# Patient Record
Sex: Female | Born: 1945
Health system: Southern US, Community
[De-identification: ages and names within clinical notes are randomized; demographics above are authoritative.]

## PROBLEM LIST (undated history)

## (undated) DIAGNOSIS — F419 Anxiety disorder, unspecified: Secondary | ICD-10-CM

## (undated) DIAGNOSIS — M1990 Unspecified osteoarthritis, unspecified site: Secondary | ICD-10-CM

## (undated) DIAGNOSIS — E7800 Pure hypercholesterolemia, unspecified: Secondary | ICD-10-CM

## (undated) DIAGNOSIS — L659 Nonscarring hair loss, unspecified: Secondary | ICD-10-CM

## (undated) DIAGNOSIS — E785 Hyperlipidemia, unspecified: Secondary | ICD-10-CM

## (undated) DIAGNOSIS — E079 Disorder of thyroid, unspecified: Secondary | ICD-10-CM

## (undated) DIAGNOSIS — K649 Unspecified hemorrhoids: Secondary | ICD-10-CM

## (undated) DIAGNOSIS — A6920 Lyme disease, unspecified: Secondary | ICD-10-CM

## (undated) DIAGNOSIS — M810 Age-related osteoporosis without current pathological fracture: Secondary | ICD-10-CM

## (undated) DIAGNOSIS — K589 Irritable bowel syndrome without diarrhea: Secondary | ICD-10-CM

## (undated) DIAGNOSIS — E039 Hypothyroidism, unspecified: Secondary | ICD-10-CM

## (undated) HISTORY — DX: Anxiety disorder, unspecified: F41.9

## (undated) HISTORY — DX: Nonscarring hair loss, unspecified: L65.9

## (undated) HISTORY — DX: Age-related osteoporosis without current pathological fracture: M81.0

## (undated) HISTORY — DX: Pure hypercholesterolemia, unspecified: E78.00

## (undated) HISTORY — PX: SHOULDER SURGERY: SHX246

## (undated) HISTORY — DX: Unspecified hemorrhoids: K64.9

## (undated) HISTORY — DX: Irritable bowel syndrome, unspecified: K58.9

## (undated) HISTORY — PX: TUBAL LIGATION: SHX77

## (undated) HISTORY — DX: Hyperlipidemia, unspecified: E78.5

## (undated) HISTORY — DX: Lyme disease, unspecified: A69.20

---

## 1998-03-19 ENCOUNTER — Other Ambulatory Visit: Admission: RE | Admit: 1998-03-19 | Discharge: 1998-03-19 | Payer: Self-pay | Admitting: Obstetrics and Gynecology

## 1999-03-10 ENCOUNTER — Other Ambulatory Visit: Admission: RE | Admit: 1999-03-10 | Discharge: 1999-03-10 | Payer: Self-pay | Admitting: Obstetrics and Gynecology

## 2000-04-21 ENCOUNTER — Other Ambulatory Visit: Admission: RE | Admit: 2000-04-21 | Discharge: 2000-04-21 | Payer: Self-pay | Admitting: Obstetrics and Gynecology

## 2000-06-08 ENCOUNTER — Other Ambulatory Visit: Admission: RE | Admit: 2000-06-08 | Discharge: 2000-06-08 | Payer: Self-pay | Admitting: Internal Medicine

## 2000-06-08 ENCOUNTER — Encounter (INDEPENDENT_AMBULATORY_CARE_PROVIDER_SITE_OTHER): Payer: Self-pay | Admitting: *Deleted

## 2001-01-18 ENCOUNTER — Emergency Department (HOSPITAL_COMMUNITY): Admission: EM | Admit: 2001-01-18 | Discharge: 2001-01-18 | Payer: Self-pay | Admitting: Emergency Medicine

## 2001-01-18 ENCOUNTER — Encounter: Payer: Self-pay | Admitting: Emergency Medicine

## 2001-05-24 ENCOUNTER — Other Ambulatory Visit: Admission: RE | Admit: 2001-05-24 | Discharge: 2001-05-24 | Payer: Self-pay | Admitting: Obstetrics and Gynecology

## 2001-07-19 ENCOUNTER — Ambulatory Visit (HOSPITAL_BASED_OUTPATIENT_CLINIC_OR_DEPARTMENT_OTHER): Admission: RE | Admit: 2001-07-19 | Discharge: 2001-07-20 | Payer: Self-pay | Admitting: Orthopedic Surgery

## 2002-06-03 ENCOUNTER — Other Ambulatory Visit: Admission: RE | Admit: 2002-06-03 | Discharge: 2002-06-03 | Payer: Self-pay | Admitting: Obstetrics and Gynecology

## 2004-06-16 ENCOUNTER — Other Ambulatory Visit: Admission: RE | Admit: 2004-06-16 | Discharge: 2004-06-16 | Payer: Self-pay | Admitting: Obstetrics and Gynecology

## 2005-06-20 ENCOUNTER — Other Ambulatory Visit: Admission: RE | Admit: 2005-06-20 | Discharge: 2005-06-20 | Payer: Self-pay | Admitting: Obstetrics and Gynecology

## 2006-06-27 ENCOUNTER — Other Ambulatory Visit: Admission: RE | Admit: 2006-06-27 | Discharge: 2006-06-27 | Payer: Self-pay | Admitting: Obstetrics and Gynecology

## 2007-10-30 ENCOUNTER — Other Ambulatory Visit: Admission: RE | Admit: 2007-10-30 | Discharge: 2007-10-30 | Payer: Self-pay | Admitting: Obstetrics and Gynecology

## 2008-04-15 ENCOUNTER — Ambulatory Visit: Payer: Self-pay | Admitting: Obstetrics and Gynecology

## 2008-11-04 ENCOUNTER — Other Ambulatory Visit: Admission: RE | Admit: 2008-11-04 | Discharge: 2008-11-04 | Payer: Self-pay | Admitting: Obstetrics and Gynecology

## 2008-11-04 ENCOUNTER — Encounter: Payer: Self-pay | Admitting: Obstetrics and Gynecology

## 2008-11-04 ENCOUNTER — Ambulatory Visit: Payer: Self-pay | Admitting: Obstetrics and Gynecology

## 2009-12-15 ENCOUNTER — Other Ambulatory Visit: Admission: RE | Admit: 2009-12-15 | Discharge: 2009-12-15 | Payer: Self-pay | Admitting: Obstetrics and Gynecology

## 2009-12-15 ENCOUNTER — Ambulatory Visit: Payer: Self-pay | Admitting: Obstetrics and Gynecology

## 2009-12-23 ENCOUNTER — Ambulatory Visit: Payer: Self-pay | Admitting: Obstetrics and Gynecology

## 2010-04-20 ENCOUNTER — Ambulatory Visit: Payer: Self-pay

## 2010-04-20 ENCOUNTER — Encounter: Payer: Self-pay | Admitting: Cardiology

## 2010-04-20 DIAGNOSIS — R609 Edema, unspecified: Secondary | ICD-10-CM | POA: Insufficient documentation

## 2010-05-08 DIAGNOSIS — R079 Chest pain, unspecified: Secondary | ICD-10-CM | POA: Insufficient documentation

## 2010-06-24 NOTE — Miscellaneous (Signed)
Summary: Orders Update  Clinical Lists Changes  Problems: Added new problem of EDEMA (ICD-782.3) Orders: Added new Test order of Venous Duplex Lower Extremity (Venous Duplex Lower) - Signed 

## 2010-09-23 ENCOUNTER — Ambulatory Visit (INDEPENDENT_AMBULATORY_CARE_PROVIDER_SITE_OTHER): Payer: BC Managed Care – PPO | Admitting: Obstetrics and Gynecology

## 2010-09-23 DIAGNOSIS — N644 Mastodynia: Secondary | ICD-10-CM

## 2010-09-23 DIAGNOSIS — N6019 Diffuse cystic mastopathy of unspecified breast: Secondary | ICD-10-CM

## 2010-11-26 ENCOUNTER — Encounter: Payer: Self-pay | Admitting: *Deleted

## 2010-12-21 ENCOUNTER — Other Ambulatory Visit (HOSPITAL_COMMUNITY)
Admission: RE | Admit: 2010-12-21 | Discharge: 2010-12-21 | Disposition: A | Discharge status: Home / Self Care | Payer: BC Managed Care – PPO | Source: Ambulatory Visit | Attending: Obstetrics and Gynecology | Admitting: Obstetrics and Gynecology

## 2010-12-21 ENCOUNTER — Encounter: Payer: Self-pay | Admitting: Obstetrics and Gynecology

## 2010-12-21 ENCOUNTER — Ambulatory Visit (INDEPENDENT_AMBULATORY_CARE_PROVIDER_SITE_OTHER): Payer: BC Managed Care – PPO | Admitting: Obstetrics and Gynecology

## 2010-12-21 DIAGNOSIS — R823 Hemoglobinuria: Secondary | ICD-10-CM

## 2010-12-21 DIAGNOSIS — Z0000 Encounter for general adult medical examination without abnormal findings: Secondary | ICD-10-CM

## 2010-12-21 DIAGNOSIS — E039 Hypothyroidism, unspecified: Secondary | ICD-10-CM

## 2010-12-21 DIAGNOSIS — Z01419 Encounter for gynecological examination (general) (routine) without abnormal findings: Secondary | ICD-10-CM | POA: Insufficient documentation

## 2010-12-21 NOTE — Patient Instructions (Signed)
Patient to call us in the morning with her dose of Armour Thyroid. She will remind [place] to call it to drugstore in [place], [place]. Mammogram in bone density in the fall.

## 2010-12-21 NOTE — Progress Notes (Signed)
Annette Barker came to see me today for an annual GYN exam. She takes Armour Thyroid but she is unsure what her latest doses. We do have in the chart of 60 mg. She is overdue to have her TSH checked and we will to that today. She stopped her hormone replacement because we felt a lump in her breast. She went and had a diagnostic mammogram that confirmed that there was something there. The mammogram and ultrasound showed that it was a lipoma or a fatty lobule. She stopped her hormone replacement until we knew what her mammogram showed. She is fine in terms of hot flashes but she has a return of her vaginal dryness. She did do very well with the Estrogel but was expensive. She did does have a history of osteopenia and is due for bone density.  Physical examination: HEENT within normal limits. Neck: Thyroid not large. No masses. Supraclavicular nodes: not enlarged. Breasts: Examined in both sitting midline position. No skin changes and no masses. Abdomen: Soft no guarding rebound or masses or hernia. Pelvic: External: Within normal limits. BUS: Within normal limits. Vaginal:within normal limits. Good estrogen effect. No evidence of cystocele rectocele or enterocele. Cervix: clean. Uterus: Normal size and shape. Adnexa: No masses. Rectovaginal exam: Confirmatory and negative. Extremities: Within normal limits.  Impression: 1. Hypothyroidism 2. Atrophic vaginitis 3. Benign mass in her breast. 4. Low bone mass  Plan: 1. Since she is having no systemic symptoms we started her on estradiol cream today 0.02%. 2. She will get her mammogram with followup bone density in the fall. 3. We will check a TSH today and she will confirm across her dose of Armour Thyroid tomorrow.

## 2010-12-28 ENCOUNTER — Other Ambulatory Visit: Payer: Self-pay

## 2010-12-28 NOTE — Telephone Encounter (Signed)
THIS IS THE PT. YOU WANTED TO TALK TO ABOUT HER ELEVATED TSH LEVEL OF 12/21/10. SHE STILL HAS NOT CALLED BACK TO SPEAK TO YOU. SHE JUST GENERATED THIS REFILL REQUEST BY HAVING PHARMACY CALL us.

## 2010-12-28 NOTE — Telephone Encounter (Signed)
Tell the pharmacy that I need to speak to the patient. Do not refill the prescription yet. To the pharmacy give you the strength of the thyroid that she was on?

## 2010-12-28 NOTE — Telephone Encounter (Signed)
SPOKE WITH STELLA AT PHARMACY SHE STATES PT IS ON ARMOUR THYROID 1 GRAIN & TOLD HER PT NEEDS TO TALK TO DR. GOTTSEGEN ABOUT RESULTS BEFORE HE CAN GIVE HER REFILLS. SHE UPDATED HER PHONE # & I WILL TRY TO REACH PT @ NEW #[phone].

## 2011-01-06 NOTE — Telephone Encounter (Signed)
PT. FINALLY CALLED BACK. TOLD HER DR. G NOT BACK IN THE OFFICE UNTIL 01-12-11. SHE STATES SHE HAS ENOUGH THYROID RX TO LAST UNTIL SHE CAN SPEAK WITH HIM ON 8-22. SHE VERIFIED SHE IS ACTUALLY TAKING 1/2 GRAIN OF ARMOUR THYROID APPROXIMATELY 4 TO 5 YEARS. PREVIOUSLY THRU DR. CHIP WATKINS. NOT THE ONE GRAIN [facility] TOLD ME.

## 2011-01-12 ENCOUNTER — Telehealth: Payer: Self-pay | Admitting: Obstetrics and Gynecology

## 2011-01-12 DIAGNOSIS — E039 Hypothyroidism, unspecified: Secondary | ICD-10-CM

## 2011-01-12 MED ORDER — ARMOUR THYROID 60 MG PO TABS: 60.0000 mg | ORAL_TABLET | ORAL | Status: DC

## 2011-01-12 NOTE — Telephone Encounter (Signed)
The patient called me back today and tell me that the dose of Armour Thyroid she takes a 60 mg. We confirmed with [facility] pharmacy. She stated that she only takes half a tablet daily. Her TSH was slightly elevated at 5.80. She does have some fatigue and some issues with losing hair. We increased her dose today to 45 mg daily. She will do that by  taking a half tablet and also half of a half tablet. She knows that the  Sig will say  1 daily but she will not do that. She will come back in 4 months for followup TSH.

## 2011-02-03 ENCOUNTER — Telehealth: Payer: Self-pay

## 2011-02-03 NOTE — Telephone Encounter (Signed)
SINCE 12/21/10 USING ESTRADIOL CREAM 0.02% FEELS VERY  " EDGY AND EXPLOSIVE". WANTS TO KNOW IF THESE SYMPTOMS WILL LEVEL OFF? CAN SHE USE JUST A PARTIAL AMOUNT OF CREAM IN APPLICATOR, AND ?GO BACK TO ESTROGEL RX? AND IF SO CAN SHE GET SAMPLES?

## 2011-02-03 NOTE — Telephone Encounter (Signed)
PT. NOTIFIED OF DR. G'S NOTE BELOW AND WILL CALL BACK  IF NO IMPROVEMENT.

## 2011-02-03 NOTE — Telephone Encounter (Signed)
It certainly may get better. I think she is absorbing a little bit of it and that's why she has her symptoms. She can you give it a little bit longer to get better or she cannot use the entire applicatorful. I would give it 2 more weeks if tolerable.

## 2011-02-04 ENCOUNTER — Other Ambulatory Visit: Payer: Self-pay | Admitting: *Deleted

## 2011-02-04 NOTE — Telephone Encounter (Signed)
rf request for provera 2.5mg . I advised pharmacy to have pt call us to talk to [place]. Her last annual exam dr G didn't mention giving this to her. We need to verify. Thanks

## 2011-02-24 ENCOUNTER — Telehealth: Payer: Self-pay | Admitting: *Deleted

## 2011-02-24 NOTE — Telephone Encounter (Signed)
Patient informed info below.  She is on the vaginal estrogen cream, will not need progesterone.

## 2011-02-24 NOTE — Telephone Encounter (Signed)
Lm for patient to call

## 2011-02-24 NOTE — Telephone Encounter (Signed)
The patient was using Estrogel cream. I think she stopped it.  If she did she does not need the progesterone pills. She is using a vaginal estrogen cream. She does not need progesterone with that.

## 2011-02-24 NOTE — Telephone Encounter (Signed)
Patient was wondering if she still needed to take her Progesterone pills?  She wasn't told to stop but didn't see in the note if she was to continue. Please advise.

## 2011-04-01 ENCOUNTER — Other Ambulatory Visit: Payer: Self-pay | Admitting: *Deleted

## 2011-04-01 DIAGNOSIS — N63 Unspecified lump in unspecified breast: Secondary | ICD-10-CM

## 2011-04-04 ENCOUNTER — Other Ambulatory Visit: Payer: Self-pay

## 2011-04-04 ENCOUNTER — Other Ambulatory Visit: Payer: Self-pay | Admitting: Obstetrics and Gynecology

## 2011-04-04 DIAGNOSIS — N63 Unspecified lump in unspecified breast: Secondary | ICD-10-CM

## 2011-05-05 ENCOUNTER — Other Ambulatory Visit: Payer: Self-pay | Admitting: *Deleted

## 2011-05-05 MED ORDER — DIAZEPAM 5 MG PO TABS: 5.0000 mg | ORAL_TABLET | Freq: Four times a day (QID) | ORAL | Status: AC | PRN

## 2011-05-05 NOTE — Telephone Encounter (Signed)
rx called in

## 2011-06-07 ENCOUNTER — Telehealth: Payer: Self-pay | Admitting: *Deleted

## 2011-06-07 DIAGNOSIS — E0590 Thyrotoxicosis, unspecified without thyrotoxic crisis or storm: Secondary | ICD-10-CM

## 2011-06-07 NOTE — Telephone Encounter (Signed)
It is now time to recheck her TSH. We had  increased her dose to 45 mg of Armour Thyroid last summer. I assume she still on this dose. As for the estrogen cream: I don't believe it would effect her vision. Ask her to call back after she sees her eye doctor. In terms of the vaginal dryness How often is she using the cream? It will not help her hot flashes as there is not enough absorption. We could give her an estrogen patch for that if she wanted to.

## 2011-06-07 NOTE — Telephone Encounter (Signed)
Pt is calling with several questions: 1. Pt is taking vaginal estrogen cream and said that she has noticed some vision changes. Not able to focus, blurred vision as well. Pt has appointment with her eye doctor next Tuesday. Should pt continue taking medication? She also said that she still has hot flashes and vaginal dryness with medication.  2. Pt would like to know when she should return to have TSH level recheck? Pt said that she had a dose change?  Please advise

## 2011-06-07 NOTE — Telephone Encounter (Signed)
Pt will follow check with her eye doctor and then follow up after OV. Pt is taking her estrogen cream 3 times a week. She does not want to try estrogen patch. Pt will call back to follow up.

## 2011-06-08 ENCOUNTER — Telehealth: Payer: Self-pay | Admitting: *Deleted

## 2011-06-08 ENCOUNTER — Other Ambulatory Visit: Payer: BC Managed Care – PPO

## 2011-06-08 DIAGNOSIS — E0590 Thyrotoxicosis, unspecified without thyrotoxic crisis or storm: Secondary | ICD-10-CM

## 2011-06-08 LAB — TSH: TSH: 2.142 u[IU]/mL (ref 0.350–4.500)

## 2011-06-08 NOTE — Telephone Encounter (Signed)
Pt informed

## 2011-06-08 NOTE — Telephone Encounter (Signed)
Pt was given vaginal estrace cream and today she asks if she needs oral progesterone. pls advise. Annette Barker

## 2011-06-08 NOTE — Telephone Encounter (Signed)
Does not need progesterone with Estrace cream.

## 2011-06-16 ENCOUNTER — Telehealth: Payer: Self-pay | Admitting: *Deleted

## 2011-06-16 NOTE — Telephone Encounter (Signed)
Pt informed with the below note she will make appointment with dermatologist.

## 2011-06-16 NOTE — Telephone Encounter (Signed)
Pt calling because her hairstyles has noticed that she has had some hair loss in her scalp in the last 3-4 months. Pt is on estradiol cream  0.02% and taking Armour Thyroid 45 gram. Pt is very concerned if this is a hormone related due to medication. She would like to know what she should do. I did offer OV for pt to schedule, but she would like to me to ask you. Please advise

## 2011-06-16 NOTE — Telephone Encounter (Signed)
Tell patient I do not think it's her thyroid as we just checked it in its normal on her Armour Thyroid. Some women lose hair after menopause if they don't take estrogen systemically. I. However think that she should first address it with her dermatologist. She does not have a dermatologist there is a woman [facility] who is interested in hair loss.

## 2011-06-30 ENCOUNTER — Emergency Department (HOSPITAL_COMMUNITY)
Admission: EM | Admit: 2011-06-30 | Discharge: 2011-06-30 | Disposition: A | Discharge status: Home / Self Care | Payer: BC Managed Care – PPO | Attending: Emergency Medicine | Admitting: Emergency Medicine

## 2011-06-30 ENCOUNTER — Emergency Department (HOSPITAL_COMMUNITY): Payer: BC Managed Care – PPO

## 2011-06-30 ENCOUNTER — Other Ambulatory Visit: Payer: Self-pay

## 2011-06-30 ENCOUNTER — Encounter (HOSPITAL_COMMUNITY): Payer: Self-pay | Admitting: *Deleted

## 2011-06-30 DIAGNOSIS — E039 Hypothyroidism, unspecified: Secondary | ICD-10-CM | POA: Insufficient documentation

## 2011-06-30 DIAGNOSIS — Z79899 Other long term (current) drug therapy: Secondary | ICD-10-CM | POA: Insufficient documentation

## 2011-06-30 DIAGNOSIS — F419 Anxiety disorder, unspecified: Secondary | ICD-10-CM

## 2011-06-30 DIAGNOSIS — R079 Chest pain, unspecified: Secondary | ICD-10-CM | POA: Insufficient documentation

## 2011-06-30 DIAGNOSIS — Z7982 Long term (current) use of aspirin: Secondary | ICD-10-CM | POA: Insufficient documentation

## 2011-06-30 DIAGNOSIS — F411 Generalized anxiety disorder: Secondary | ICD-10-CM | POA: Insufficient documentation

## 2011-06-30 HISTORY — DX: Disorder of thyroid, unspecified: E07.9

## 2011-06-30 LAB — BASIC METABOLIC PANEL WITH GFR
Calcium: 9.4 mg/dL (ref 8.4–10.5)
Chloride: 104 meq/L (ref 96–112)
Creatinine, Ser: 0.73 mg/dL (ref 0.50–1.10)
GFR calc Af Amer: >90 mL/min (ref >90)
GFR calc non Af Amer: 89 mL/min — ABNORMAL LOW (ref >90)

## 2011-06-30 LAB — CBC
HCT: 33.7 % — ABNORMAL LOW (ref 36.0–46.0)
Hemoglobin: 11.5 g/dL — ABNORMAL LOW (ref 12.0–15.0)
MCH: 33 pg (ref 26.0–34.0)
MCHC: 34.1 g/dL (ref 30.0–36.0)
MCV: 96.8 fL (ref 78.0–100.0)
Platelets: 256 K/uL (ref 150–400)
RBC: 3.48 MIL/uL — ABNORMAL LOW (ref 3.87–5.11)
RDW: 13.5 % (ref 11.5–15.5)
WBC: 4.8 K/uL (ref 4.0–10.5)

## 2011-06-30 LAB — DIFFERENTIAL
Basophils Absolute: 0 K/uL (ref 0.0–0.1)
Basophils Relative: 1 % (ref 0–1)
Eosinophils Absolute: 0.3 K/uL (ref 0.0–0.7)
Eosinophils Relative: 6 % — ABNORMAL HIGH (ref 0–5)
Lymphocytes Relative: 41 % (ref 12–46)
Lymphs Abs: 2 10*3/uL (ref 0.7–4.0)
Monocytes Absolute: 0.4 10*3/uL (ref 0.1–1.0)
Monocytes Relative: 9 % (ref 3–12)
Neutro Abs: 2.1 10*3/uL (ref 1.7–7.7)
Neutrophils Relative %: 43 % (ref 43–77)

## 2011-06-30 LAB — POCT I-STAT, CHEM 8
BUN: 10 mg/dL (ref 6–23)
Calcium, Ion: 1.2 mmol/L (ref 1.12–1.32)
Chloride: 105 mEq/L (ref 96–112)
Creatinine, Ser: 0.7 mg/dL (ref 0.50–1.10)
Glucose, Bld: 85 mg/dL (ref 70–99)
HCT: 36 % (ref 36.0–46.0)
Hemoglobin: 12.2 g/dL (ref 12.0–15.0)
Potassium: 4.1 mEq/L (ref 3.5–5.1)
Sodium: 141 meq/L (ref 135–145)
TCO2: 27 mmol/L (ref 0–100)

## 2011-06-30 LAB — BASIC METABOLIC PANEL
BUN: 10 mg/dL (ref 6–23)
CO2: 27 mEq/L (ref 19–32)
Glucose, Bld: 85 mg/dL (ref 70–99)
Potassium: 3.9 mEq/L (ref 3.5–5.1)
Sodium: 139 mEq/L (ref 135–145)

## 2011-06-30 LAB — TROPONIN I: Troponin I: <0.3 ng/mL (ref <0.30)

## 2011-06-30 MED ORDER — LORAZEPAM 1 MG PO TABS
1.0000 mg | ORAL_TABLET | Freq: Once | ORAL | Status: AC
  Administered 2011-06-30: 1 mg via ORAL
  Filled 2011-06-30: qty 1

## 2011-06-30 NOTE — ED Notes (Signed)
Pt here to see spouse who is in Psych ED but she then started stating "I've been having cp x 2 days, I've been checked out before, I don't want to be a hypochondriac, and of course I'm having a panic attack, took regular ASA 15-20 minutes ago"

## 2011-06-30 NOTE — ED Notes (Signed)
md at bedside

## 2011-06-30 NOTE — Discharge Instructions (Signed)
Chest Pain (Nonspecific) It is often hard to give a specific diagnosis for the cause of chest pain. There is always a chance that your pain could be related to something serious, such as a heart attack or a blood clot in the lungs. You need to follow up with your caregiver for further evaluation. CAUSES   Heartburn.   Pneumonia or bronchitis.   Anxiety and stress.   Inflammation around your heart (pericarditis) or lung (pleuritis or pleurisy).   A blood clot in the lung.   A collapsed lung (pneumothorax). It can develop suddenly on its own (spontaneous pneumothorax) or from injury (trauma) to the chest.  The chest wall is composed of bones, muscles, and cartilage. Any of these can be the source of the pain.  The bones can be bruised by injury.   The muscles or cartilage can be strained by coughing or overwork.   The cartilage can be affected by inflammation and become sore (costochondritis).  DIAGNOSIS  Lab tests or other studies, such as X-rays, an EKG, stress testing, or cardiac imaging, may be needed to find the cause of your pain.  TREATMENT   Treatment depends on what may be causing your chest pain. Treatment may include:   Acid blockers for heartburn.   Anti-inflammatory medicine.   Pain medicine for inflammatory conditions.   Antibiotics if an infection is present.   You may be advised to change lifestyle habits. This includes stopping smoking and avoiding caffeine and chocolate.   You may be advised to keep your head raised (elevated) when sleeping. This reduces the chance of acid going backward from your stomach into your esophagus.   Most of the time, nonspecific chest pain will improve within 2 to 3 days with rest and mild pain medicine.  HOME CARE INSTRUCTIONS   If antibiotics were prescribed, take the full amount even if you start to feel better.   For the next few days, avoid physical activities that bring on chest pain. Continue physical activities as  directed.   Do not smoke cigarettes or drink alcohol until your symptoms are gone.   Only take over-the-counter or prescription medicine for pain, discomfort, or fever as directed by your caregiver.   Follow your caregiver's suggestions for further testing if your chest pain does not go away.   Keep any follow-up appointments you made. If you do not go to an appointment, you could develop lasting (chronic) problems with pain. If there is any problem keeping an appointment, you must call to reschedule.  SEEK MEDICAL CARE IF:   You think you are having problems from the medicine you are taking. Read your medicine instructions carefully.   Your chest pain does not go away, even after treatment.   You develop a rash with blisters on your chest.  SEEK IMMEDIATE MEDICAL CARE IF:   You have increased chest pain or pain that spreads to your arm, neck, jaw, back, or belly (abdomen).   You develop shortness of breath, an increasing cough, or you are coughing up blood.   You have severe back or abdominal pain, feel sick to your stomach (nauseous) or throw up (vomit).   You develop severe weakness, fainting, or chills.   You have an oral temperature above 102 F (38.9 C), not controlled by medicine.  THIS IS AN EMERGENCY. Do not wait to see if the pain will go away. Get medical help at once. Call your local emergency services (911 in [place] Do not drive yourself to   the hospital. MAKE SURE YOU:   Understand these instructions.   Will watch your condition.   Will get help right away if you are not doing well or get worse.  Document Released: 02/16/2005 Document Revised: 01/19/2011 Document Reviewed: 12/13/2007 ExitCare Patient Information 2012 [employer].  Anxiety and Panic Attacks Your caregiver has informed you that you are having an anxiety or panic attack. There may be many forms of this. Most of the time these attacks come suddenly and without warning. They come at any time of  day, including periods of sleep, and at any time of life. They may be strong and unexplained. Although panic attacks are very scary, they are physically harmless. Sometimes the cause of your anxiety is not known. Anxiety is a protective mechanism of the body in its fight or flight mechanism. Most of these perceived danger situations are actually nonphysical situations (such as anxiety over losing a job). CAUSES  The causes of an anxiety or panic attack are many. Panic attacks may occur in otherwise healthy people given a certain set of circumstances. There may be a genetic cause for panic attacks. Some medications may also have anxiety as a side effect. SYMPTOMS  Some of the most common feelings are:  Intense terror.   Dizziness, feeling faint.   Hot and cold flashes.   Fear of going crazy.   Feelings that nothing is real.   Sweating.   Shaking.   Chest pain or a fast heartbeat (palpitations).   Smothering, choking sensations.   Feelings of impending doom and that death is near.   Tingling of extremities, this may be from over-breathing.   Altered reality (derealization).   Being detached from yourself (depersonalization).  Several symptoms can be present to make up anxiety or panic attacks. DIAGNOSIS  The evaluation by your caregiver will depend on the type of symptoms you are experiencing. The diagnosis of anxiety or panic attack is made when no physical illness can be determined to be a cause of the symptoms. TREATMENT  Treatment to prevent anxiety and panic attacks may include:  Avoidance of circumstances that cause anxiety.   Reassurance and relaxation.   Regular exercise.   Relaxation therapies, such as yoga.   Psychotherapy with a psychiatrist or therapist.   Avoidance of caffeine, alcohol and illegal drugs.   Prescribed medication.  SEEK IMMEDIATE MEDICAL CARE IF:   You experience panic attack symptoms that are different than your usual symptoms.   You  have any worsening or concerning symptoms.  Document Released: 05/09/2005 Document Revised: 01/19/2011 Document Reviewed: 09/10/2009 ExitCare Patient Information 2012 [employer]. 

## 2011-06-30 NOTE — ED Provider Notes (Addendum)
History     CSN: [id number]  Arrival date & time 06/30/11  1054   First MD Initiated Contact with Patient 06/30/11 1110      Chief Complaint  Patient presents with  . Chest Pain    (Consider location/radiation/quality/duration/timing/severity/associated sxs/prior treatment) HPI Comments: Pt with h/o anxiety and possibly panic attacks, reports CP and tightness for past 2 days, fairly persistently.  She took ASA PTA and continues to have some discomfort.  She had a stress test many years ago and told it was ok.  She is a non smoker, no sig risk factor, no smoking, HTN, DM.  She denies exertional CP or SOB.  No recent nausea, sweating.  Some mild SOB.  Tightness is diffuse in anterior chest, not radiating to back or abd.    The history is provided by the patient.    Past Medical History  Diagnosis Date  . Osteopenia   . Atrophic vaginitis   . Menopausal symptoms   . Vaginal dryness   . IBS (irritable bowel syndrome)   . Hair loss   . Thyroid disease     hypothyroid    Past Surgical History  Procedure Date  . Shoulder surgery   . Tubal ligation     Family History  Problem Relation Age of Onset  . Heart disease Mother   . Alzheimer's disease Mother   . Osteoporosis Father   . Cancer Father     leukemia  . Cancer Sister     PERITONEAL CANCER  . Heart disease Sister     History  Substance Use Topics  . Smoking status: Former Games developer  . Smokeless tobacco: Never Used  . Alcohol Use: Yes     rare, hasnt drank lately    OB History    Grav Para Term Preterm Abortions TAB SAB Ect Mult Living   3 2 2  1  1   2       Review of Systems  Respiratory: Positive for shortness of breath.   Cardiovascular: Positive for chest pain.  Psychiatric/Behavioral: The patient is nervous/anxious.   All other systems reviewed and are negative.    Allergies  Review of patient's allergies indicates no known allergies.  Home Medications   Current Outpatient Rx  Name Route Sig  Dispense Refill  . ASPIRIN EC 81 MG PO TBEC Oral Take 81 mg by mouth daily.    Marland Kitchen DIAZEPAM 5 MG PO TABS Oral Take 5 mg by mouth daily. Patient 1/4 tablet by mouth every day    . BENADRYL PO Oral Take 12.5 mg by mouth at bedtime.     Marland Kitchen ESTRADIOL 0.1 MG/GM VA CREA Vaginal Place 1 g vaginally every other day.    . THYROID 60 MG PO TABS Oral Take 60 mg by mouth 1 day or 1 dose. Patient states that she only takes 3/4 tablet by mouth once a day. Pharmacy states that it is a 60mg  tablet once a day      BP 136/67  Pulse 60  Temp(Src) 98.5 F (36.9 C) (Oral)  Resp 19  Ht 5\' 4"  (1.626 m)  Wt 125 lb (56.7 kg)  BMI 21.46 kg/m2  SpO2 98%  LMP 05/23/1998  Physical Exam  Nursing note and vitals reviewed. Constitutional: She is oriented to person, place, and time. She appears well-developed and well-nourished.  HENT:  Head: Normocephalic and atraumatic.  Eyes: Pupils are equal, round, and reactive to light. No scleral icterus.  Neck: Normal range of motion. Neck  supple.  Cardiovascular: Normal rate and regular rhythm.   Pulmonary/Chest: Effort normal. No respiratory distress. She has no wheezes.  Abdominal: Soft. Bowel sounds are normal.  Neurological: She is alert and oriented to person, place, and time.  Skin: Skin is warm and dry.  Psychiatric: She has a normal mood and affect. Her behavior is normal. Judgment and thought content normal.    ED Course  Procedures (including critical care time)  Labs Reviewed  CBC - Abnormal; Notable for the following:    RBC 3.48 (*)    Hemoglobin 11.5 (*)    HCT 33.7 (*)    All other components within normal limits  DIFFERENTIAL - Abnormal; Notable for the following:    Eosinophils Relative 6 (*)    All other components within normal limits  BASIC METABOLIC PANEL - Abnormal; Notable for the following:    GFR calc non Af Amer 89 (*)    All other components within normal limits  TROPONIN I  POCT I-STAT, CHEM 8  LAB REPORT - SCANNED   No results  found.   1. Chest pain   2. Anxiety     Room air saturation is 100% which is normal. ECG at time 11:05, shows sinus bradycardia at a rate of 57. Slight artifact in lead V1 which is probably normal. Normal axis normal intervals and no ST or T wave abnormalities noted. There are no prior EKGs.  MDM   Pt's symptoms seemed more to do with anxiety in my opinion.  Pt's ECG shows no ischemia.  Troponin is neg.  Pt walks 4 miles with no CP, sweats.  She will get SOB, but only on the highest incline.  She can still finish exercise.  She has had neg stress test with Dr. Deborah Chalk about 4 years ago.  She is sig improved after PO Ativan here.  CXR which I reviewed shows no acute.  Pt understands to return for worse symptoms, to follow up with Dr. Tanya Nones soon for stress issues and CP.  I will refer her to [facility].          Gavin Pound. Oletta Lamas, MD 06/30/11 1444  Gavin Pound. Oletta Lamas, MD 07/18/11 [id number]  Gavin Pound. Oletta Lamas, MD 07/18/11 2336

## 2011-06-30 NOTE — ED Notes (Signed)
Staff from pysch ed called and asked if they could get an update about pt for her husband who is also a pt. Pt gave verbal agreement and staff were told to relay to husband that "dr was in room and said he thought pt was doing fine. "

## 2011-07-18 ENCOUNTER — Encounter (HOSPITAL_COMMUNITY): Payer: Self-pay | Admitting: Emergency Medicine

## 2011-08-17 ENCOUNTER — Telehealth: Payer: Self-pay | Admitting: *Deleted

## 2011-08-17 MED ORDER — HYDROCOD POLST-CHLORPHEN POLST 10-8 MG/5ML PO LQCR: 5.0000 mL | Freq: Two times a day (BID) | ORAL | Status: DC

## 2011-08-17 NOTE — Telephone Encounter (Signed)
Left message on pt vm with the below note, rx sent to pharmacy, told pt to call if questions.

## 2011-08-17 NOTE — Telephone Encounter (Signed)
rx called into pharmacy

## 2011-08-17 NOTE — Telephone Encounter (Signed)
You can call her in tussionex. If not better in a week she needs chest x-ray. Why did she call me rather than Dr. Tanya Nones?

## 2011-08-17 NOTE — Telephone Encounter (Signed)
Pt called requesting cough medication with codeine. Pt had the flu recently and now has a bad cough, c/o no sleep at night due to this. She has no fever nor other complaints. Please advise

## 2011-08-23 ENCOUNTER — Telehealth: Payer: Self-pay | Admitting: *Deleted

## 2011-08-23 NOTE — Telephone Encounter (Signed)
Pt called to follow up with telephone encounter 08/17/11 pt still has cough. Pt informed to follow up with PCP regarding this. Pt will do so and call back if needed.

## 2011-08-31 ENCOUNTER — Telehealth: Payer: Self-pay | Admitting: *Deleted

## 2011-08-31 ENCOUNTER — Other Ambulatory Visit: Payer: Self-pay | Admitting: *Deleted

## 2011-08-31 MED ORDER — THYROID 60 MG PO TABS: 60.0000 mg | ORAL_TABLET | Freq: Every day | ORAL | Status: DC

## 2011-08-31 NOTE — Telephone Encounter (Signed)
Pharmacy called for refill on thyroid (armour) 60 mg tablet 1 po daily. # 30 with 2 refill called in to [facility] [phone]. Pt will take medication as directed on 8//2/12 office note.

## 2011-09-01 MED ORDER — DIAZEPAM 5 MG PO TABS: 5.0000 mg | ORAL_TABLET | Freq: Every day | ORAL | Status: DC

## 2011-09-01 NOTE — Telephone Encounter (Signed)
Telephone call to discuss Valium refill request. States takes a quarter Valium  at bedtime to help prevent grinding teeth at night. States problem started at menopause and uses one 30 pill prescription every 2-3 months.

## 2011-09-01 NOTE — Telephone Encounter (Signed)
rx called in

## 2011-10-06 ENCOUNTER — Other Ambulatory Visit: Payer: Self-pay | Admitting: *Deleted

## 2011-10-06 DIAGNOSIS — N63 Unspecified lump in unspecified breast: Secondary | ICD-10-CM

## 2011-10-18 ENCOUNTER — Encounter: Payer: Self-pay | Admitting: Obstetrics and Gynecology

## 2011-11-21 ENCOUNTER — Other Ambulatory Visit: Payer: Self-pay

## 2011-11-21 MED ORDER — THYROID 60 MG PO TABS: 60.0000 mg | ORAL_TABLET | ORAL | Status: DC

## 2011-11-21 NOTE — Telephone Encounter (Signed)
Per Revonda Standard at [facility] 60mg  is the same as one grain. Reminded her to tell pt to set up AEX after end of this month.

## 2011-12-28 ENCOUNTER — Ambulatory Visit (INDEPENDENT_AMBULATORY_CARE_PROVIDER_SITE_OTHER): Payer: BC Managed Care – PPO | Admitting: Obstetrics and Gynecology

## 2011-12-28 ENCOUNTER — Encounter: Payer: Self-pay | Admitting: Obstetrics and Gynecology

## 2011-12-28 VITALS — BP 110/60 | Ht 63.0 in | Wt 122.0 lb

## 2011-12-28 DIAGNOSIS — E039 Hypothyroidism, unspecified: Secondary | ICD-10-CM

## 2011-12-28 DIAGNOSIS — E079 Disorder of thyroid, unspecified: Secondary | ICD-10-CM | POA: Insufficient documentation

## 2011-12-28 DIAGNOSIS — N6009 Solitary cyst of unspecified breast: Secondary | ICD-10-CM

## 2011-12-28 DIAGNOSIS — M8580 Other specified disorders of bone density and structure, unspecified site: Secondary | ICD-10-CM | POA: Insufficient documentation

## 2011-12-28 DIAGNOSIS — N898 Other specified noninflammatory disorders of vagina: Secondary | ICD-10-CM | POA: Insufficient documentation

## 2011-12-28 DIAGNOSIS — K589 Irritable bowel syndrome without diarrhea: Secondary | ICD-10-CM | POA: Insufficient documentation

## 2011-12-28 DIAGNOSIS — N952 Postmenopausal atrophic vaginitis: Secondary | ICD-10-CM | POA: Insufficient documentation

## 2011-12-28 DIAGNOSIS — Z01419 Encounter for gynecological examination (general) (routine) without abnormal findings: Secondary | ICD-10-CM

## 2011-12-28 LAB — CBC WITH DIFFERENTIAL/PLATELET
Basophils Absolute: 0 10*3/uL (ref 0.0–0.1)
HCT: 36.3 % (ref 36.0–46.0)
Lymphocytes Relative: 26 % (ref 12–46)
Lymphs Abs: 2 10*3/uL (ref 0.7–4.0)
Monocytes Absolute: 0.6 10*3/uL (ref 0.1–1.0)
Neutro Abs: 4.9 10*3/uL (ref 1.7–7.7)
RBC: 3.77 MIL/uL — ABNORMAL LOW (ref 3.87–5.11)
RDW: 13.7 % (ref 11.5–15.5)
WBC: 7.7 10*3/uL (ref 4.0–10.5)

## 2011-12-28 LAB — TSH: TSH: 0.563 u[IU]/mL (ref 0.350–4.500)

## 2011-12-28 LAB — LIPID PANEL: Total CHOL/HDL Ratio: 4.9 Ratio

## 2011-12-28 NOTE — Progress Notes (Signed)
Patient came to see me today for her annual GYN exam. She stopped her estrogel which she really liked because of the expense. She continues to use estrogen cream that I [facility] with good results. She does feel however that she was happier with systemic estrogen. She is also noticing some hair loss. She is on Armour thyroid. Our records show that she takes is 60 mg pill and takes three quarters of it  Daily. She was in convinced that was strength and will go home and check. She was supposed to have a followup mammogram after a breast cyst  was discovered in November 2012. She said she went but I have no record of it. She will get me a copy. She is now going to  change PCPs but will have her thyroid and cholesterol checked here today. She ate  6 hours ago but was just a bowl of cereal. She is due for a bone density. Her last bone density was in 2009 and her  worse T score was -2.4. Her frax risk was not elevated although we offered her biphosphonate  witch she declined. She has had no fractures. Many years ago she had a slightly abnormal Pap smear which was normal when repeated. Other than that she has had normal Pap smears throughout her life and her last 1 was 2011. She is having no vaginal bleeding. She is having no pelvic pain.  Physical examination: Kennon Portela present. HEENT within normal limits. Neck: Thyroid not large. No masses. Supraclavicular nodes: not enlarged. Breasts: Examined in both sitting and lying  position. No skin changes and no masses. Abdomen: Soft no guarding rebound or masses or hernia. Pelvic: External: Within normal limits. BUS: Within normal limits. Vaginal:within normal limits. Good estrogen effect. No evidence of cystocele rectocele or enterocele. Cervix: clean. Uterus: Normal size and shape. Adnexa: No masses. Rectovaginal exam: Confirmatory and negative. Extremities: Within normal limits.  Assessment: #1. Menopausal symptoms #2. Hair  loss #3. Atrophic  vaginitis #4. Significant osteopenia #5. Hypothyroidism  Plan: Patient to me her mammogram report. Lab work done. Patient to find out dose of Armour Thyroid she is on. Patient will get a followup bone density. Patient will decide whether she wants to stay on vaginal estrogen or switch back to estrogen gel.The new Pap smear guidelines were discussed with the patient. No pap done.

## 2011-12-28 NOTE — Patient Instructions (Addendum)
#  1. Get me your mammogram report from earlier this year. #2. Lookup the dose of thyroid you are on so when we call you we can prescribe your thyroid( this will be after we get your thyroid results). #3 scheduled bone density either at our office or Dr. Cherlyn Labella. #4 posterior pharmacist about whether there are generic or cheaper estrogen gels then Estrogel and if you want prescribed let me know.

## 2011-12-29 ENCOUNTER — Telehealth: Payer: Self-pay | Admitting: Obstetrics and Gynecology

## 2011-12-29 ENCOUNTER — Other Ambulatory Visit: Payer: Self-pay | Admitting: Obstetrics and Gynecology

## 2011-12-29 LAB — URINALYSIS W MICROSCOPIC + REFLEX CULTURE
Bilirubin Urine: NEGATIVE
Casts: NONE SEEN
Crystals: NONE SEEN
Glucose, UA: NEGATIVE mg/dL
Hgb urine dipstick: NEGATIVE
Ketones, ur: NEGATIVE mg/dL
Nitrite: NEGATIVE
Specific Gravity, Urine: 1.015 (ref 1.005–1.030)
pH: 6 (ref 5.0–8.0)

## 2011-12-29 MED ORDER — THYROID 60 MG PO TABS: 60.0000 mg | ORAL_TABLET | ORAL | Status: DC

## 2011-12-29 NOTE — Telephone Encounter (Signed)
(  continued)  Patient said she takes 3/4 of the tablet and that is what the former RX had noted on it as well.

## 2011-12-29 NOTE — Telephone Encounter (Signed)
Patient was informed regarding lipid profile results earlier this morning and appointment was scheduled with cardiologist per Dr. Reece Agar and patient was informed of appt date 01/24/12.  She is calling back this afternoon to ask if perhaps she should repeat her lipid profile in about a month and delay seeing cardiologist.  She said she took an exercise break pretty much the whole month of July and she was not fasting for the lipid profile that she did here.  She wondered if she just had a spike in results because of those things.  She said she is hesitant about seeing cardiologist because she has tried to take statin drugs before but could not because of horrible leg cramps.  Pls advise.

## 2011-12-29 NOTE — Telephone Encounter (Signed)
If she was offered statin drugs in the past obviously her cholesterol has already been up. However if she would like to wait suggest she work very hard on her diet as well as exercise and do a fasting lipid profile in 2 months. Did she tell you her thyroid dose?

## 2011-12-29 NOTE — Telephone Encounter (Signed)
Error-no note

## 2011-12-29 NOTE — Telephone Encounter (Signed)
She said her bottle said Armour 1 gram Thyroid.  I actually did a re-order from her July prescription because there was no dosing in EPIC like that.  I refilled the dose she was currently on in the system. Let me know if needs to be different. Thanks.

## 2011-12-30 NOTE — Telephone Encounter (Signed)
Left message for patient to call me

## 2011-12-31 LAB — URINE CULTURE

## 2012-01-02 ENCOUNTER — Other Ambulatory Visit: Payer: Self-pay | Admitting: Obstetrics and Gynecology

## 2012-01-02 DIAGNOSIS — N390 Urinary tract infection, site not specified: Secondary | ICD-10-CM

## 2012-01-02 MED ORDER — CIPROFLOXACIN HCL 250 MG PO TABS: 250.0000 mg | ORAL_TABLET | Freq: Two times a day (BID) | ORAL | Status: AC

## 2012-01-02 NOTE — Telephone Encounter (Signed)
Patient called and said she has talked with her Primary Care MD, Dr. Pete Glatter, regarding her cholesterol and he thinks she is okay to just wait and re-check it when she comes to see him in November for her yearly medical exam.  He said then they can decide how to proceed.  I cancelled cardiology appointment.    See result note but in same conversation patient was informed regarding UTI and meds already e-scribed. She will return for culture week after. Order in system and patient instructed to call for lab appointment.

## 2012-01-05 ENCOUNTER — Telehealth: Payer: Self-pay | Admitting: *Deleted

## 2012-01-05 NOTE — Telephone Encounter (Signed)
Pt called requesting that her cipro 250 mg be sent to [facility] in [place], rx will be sent.

## 2012-01-12 ENCOUNTER — Telehealth: Payer: Self-pay | Admitting: *Deleted

## 2012-01-12 MED ORDER — ESTRADIOL 0.75 MG/1.25 GM (0.06%) TD GEL: TRANSDERMAL | Status: DC

## 2012-01-12 NOTE — Telephone Encounter (Signed)
Yes, the pharmacy will not be able to tell her how much medication will cost until the run it with her insurance. She will need a rx for this to be done. If medication too expensive still pt will call back. I called pharmacy and there is no generic for this medication.

## 2012-01-12 NOTE — Telephone Encounter (Signed)
Pt informed with the below note, rx sent. 

## 2012-01-12 NOTE — Telephone Encounter (Signed)
I will place chart on desk.

## 2012-01-12 NOTE — Telephone Encounter (Signed)
The drug is Estrogel. Last time she was on it she required 2 squirts per day. Some people can get by on 1 today. Obviously that we'll change the cost  Significantly. I. Would get her approved for 2 and explain to the patient that she might get by with just one squirt per day.

## 2012-01-12 NOTE — Telephone Encounter (Signed)
FYI Pt called requesting estrogen gel rx per office visit note 12/28/11, pt told to call when ready for rx. rx will be sent.

## 2012-01-12 NOTE — Telephone Encounter (Signed)
Is she talking about Estrogel which she previously took? Last time I saw her her she said it was too expensive??. If that's what she wants I need her paper chart.

## 2012-01-12 NOTE — Addendum Note (Signed)
Addended by: Aura Camps on: 01/12/2012 02:09 PM   Modules accepted: Orders

## 2012-01-13 ENCOUNTER — Other Ambulatory Visit: Payer: Self-pay | Admitting: *Deleted

## 2012-01-13 DIAGNOSIS — M899 Disorder of bone, unspecified: Secondary | ICD-10-CM

## 2012-01-19 ENCOUNTER — Ambulatory Visit (INDEPENDENT_AMBULATORY_CARE_PROVIDER_SITE_OTHER): Payer: BC Managed Care – PPO

## 2012-01-19 ENCOUNTER — Other Ambulatory Visit: Payer: BC Managed Care – PPO

## 2012-01-19 ENCOUNTER — Other Ambulatory Visit: Payer: Self-pay | Admitting: Obstetrics and Gynecology

## 2012-01-19 ENCOUNTER — Other Ambulatory Visit: Payer: Self-pay | Admitting: *Deleted

## 2012-01-19 DIAGNOSIS — N390 Urinary tract infection, site not specified: Secondary | ICD-10-CM

## 2012-01-19 DIAGNOSIS — M899 Disorder of bone, unspecified: Secondary | ICD-10-CM

## 2012-01-19 DIAGNOSIS — M810 Age-related osteoporosis without current pathological fracture: Secondary | ICD-10-CM

## 2012-01-20 ENCOUNTER — Encounter: Payer: Self-pay | Admitting: Cardiovascular Disease

## 2012-01-21 LAB — URINE CULTURE: Colony Count: 25000

## 2012-01-24 ENCOUNTER — Ambulatory Visit: Payer: BC Managed Care – PPO | Admitting: Cardiovascular Disease

## 2012-02-02 ENCOUNTER — Encounter: Payer: Self-pay | Admitting: Obstetrics and Gynecology

## 2012-02-08 ENCOUNTER — Ambulatory Visit (INDEPENDENT_AMBULATORY_CARE_PROVIDER_SITE_OTHER): Payer: BC Managed Care – PPO | Admitting: Obstetrics and Gynecology

## 2012-02-08 ENCOUNTER — Encounter: Payer: Self-pay | Admitting: Obstetrics and Gynecology

## 2012-02-08 DIAGNOSIS — M810 Age-related osteoporosis without current pathological fracture: Secondary | ICD-10-CM

## 2012-02-08 NOTE — Progress Notes (Signed)
  Patient was asked to return by me since her bone density showed osteoporosis. Her last bone density had been 2008 and just  showed osteopenia. During this 5 year interval she has not been on estrogen but just restarted it. She exercises daily. She does not take calcium or vitamin D but is in the sun a lot and thinks her vitamin D level was previously normal. She is a nonsmoker nondrinker. She does like to [employer] and hike. She does occasionally get acid reflux.  We had a very long discussion of the above of 30 minutes. She will see if she's getting adequate dietary calcium and if not we'll add supplemental. We checked secondary causes a bone loss. We discussed Fosamax, Actonel or Prolia. Information given on Prolia. Explicit instructions given on biphosphonate's and how to take them. After lab back we will make a decision.

## 2012-02-10 ENCOUNTER — Other Ambulatory Visit: Payer: Self-pay | Admitting: *Deleted

## 2012-02-10 MED ORDER — THYROID 60 MG PO TABS: 60.0000 mg | ORAL_TABLET | ORAL | Status: DC

## 2012-02-24 ENCOUNTER — Other Ambulatory Visit: Payer: Self-pay | Admitting: Obstetrics and Gynecology

## 2012-02-24 DIAGNOSIS — R7989 Other specified abnormal findings of blood chemistry: Secondary | ICD-10-CM

## 2012-02-28 ENCOUNTER — Other Ambulatory Visit: Payer: BC Managed Care – PPO

## 2012-02-29 ENCOUNTER — Other Ambulatory Visit: Payer: BC Managed Care – PPO

## 2012-02-29 DIAGNOSIS — R7989 Other specified abnormal findings of blood chemistry: Secondary | ICD-10-CM

## 2012-03-02 ENCOUNTER — Telehealth: Payer: Self-pay | Admitting: *Deleted

## 2012-03-02 ENCOUNTER — Telehealth: Payer: Self-pay | Admitting: Obstetrics and Gynecology

## 2012-03-02 DIAGNOSIS — E349 Endocrine disorder, unspecified: Secondary | ICD-10-CM

## 2012-03-02 LAB — PTH, INTACT AND CALCIUM
Calcium, Total (PTH): 9.6 mg/dL (ref 8.4–10.5)
PTH: 93.6 pg/mL — ABNORMAL HIGH (ref 14.0–72.0)

## 2012-03-02 NOTE — Telephone Encounter (Signed)
Patient informed.  Annette Barker is working on her referral to Dr. Sharl Ma and patient will expect call from her.

## 2012-03-02 NOTE — Telephone Encounter (Signed)
Patient informed.  She had several questions.  1.  "What does this mean?"  2. "Does my thyroid medication have any bearing on this result being increased?"  3.  "Is this increase related to my bone loss?"

## 2012-03-02 NOTE — Telephone Encounter (Signed)
Message copied by Aura Camps on Fri Mar 02, 2012  3:13 PM ------      Message from: Keenan Bachelor      Created: Fri Mar 02, 2012  2:27 PM       parathyroid hormone is still elevated. She needs to see an endocrinologist. Dr. Talmage Coin is a good choice. Please refer.                               Victorino Dike,      I spoke with her so she knows you are going to refer her.  Her only concern was that it was an [facility] physician since her primary care MD is [facility].  They are an [facility].

## 2012-03-02 NOTE — Telephone Encounter (Signed)
Message copied by Keenan Bachelor on Fri Mar 02, 2012  2:25 PM ------      Message from: Trellis Paganini      Created: Fri Mar 02, 2012 12:56 PM       parathyroid hormone is still elevated. She needs to see an endocrinologist. Dr. Sharl Ma is a good choice. Please refer.( he is in Dr. Jerelene Redden old office)

## 2012-03-02 NOTE — Telephone Encounter (Signed)
The parathyroid gland is inside the thyroid gland and makes calcium. Everybody has 4 parathyroid glands. If your  parathyroid gland is overactive you can lose bone and so it should be treated. An endocrinologist would  Treat it. Your thyroid medicine has nothing to do with it. The levels can sometimes be off and there is nothing that needs to be done. The endocrinologist will decide.

## 2012-03-05 NOTE — Telephone Encounter (Signed)
Referral form filled out and faxed to Dr.[facility] office. They will contact pt and office with time and date.

## 2012-03-19 NOTE — Telephone Encounter (Signed)
Left message with referral coordinator, to call for update regarding pt appointment.

## 2012-03-22 NOTE — Telephone Encounter (Signed)
Appointment scheduled on 05/09/12 @ 10:30 am

## 2012-04-07 ENCOUNTER — Telehealth: Payer: Self-pay | Admitting: Gynecology

## 2012-04-07 NOTE — Telephone Encounter (Signed)
On Call Note:  Needs refill of estradiol vaginal cream.  Reports recent annual exam with Dr Reece Agar and phone call to office this past week to have refilled, but called pharmacy and not there.  Reviewed Epic and no documentation of visit.  Will refill for one month and have staff contact patient to figure out situation.

## 2012-04-10 ENCOUNTER — Telehealth: Payer: Self-pay | Admitting: Gynecology

## 2012-04-10 NOTE — Telephone Encounter (Signed)
On Call Note 04/07/2012 : Needs refill of estradiol vaginal cream. Reports recent annual exam with Dr Reece Agar and phone call to office this past week to have refilled, but called pharmacy and not there. Reviewed Epic and patient apparently was making up her mind about which route and she has decided on the vaginal route per Dr. Verl Dicker note.  Refill for vaginal estrogen was called to the pharmacist.

## 2012-04-10 NOTE — Telephone Encounter (Signed)
Telephone note 04/07/2012 is erroneous entry. It was the wrong patient who happened to have the same birthday as the other patient.

## 2012-04-11 ENCOUNTER — Telehealth: Payer: Self-pay | Admitting: *Deleted

## 2012-04-11 NOTE — Telephone Encounter (Signed)
Since she is doing well with the Estrogel and is going to continue it she does need to take progesterone. She should start now medroxyprogesterone 2.5 mg daily. Tell her to continue to see Dr. Pete Glatter for cholesterol followup. The PTH is parathyroid not thyroid. I do not believe it is influenced by progesterone. I would like her to also asked Dr. Sharl Ma. I would like her to call me back in 4 weeks and tell me how she is doing since she added the  progesterone.

## 2012-04-11 NOTE — Telephone Encounter (Signed)
Pt called with a couple of question:  1. She is currently taking Estrogel 2 squirts per day. Pt asked if she should be taking progesterone with her estrogel?  2. (FYI) She saw Dr.Stoneking today and her total cholesterol is 275 and LDL 195 which has increased. She is going to try to manage her cholesterol with diet and exercise.   3. Her appointment with Dr.[facility] is in Dec. For her elevated PTH level. She read on the Internet that her lack of progesterone can cause her issues thyroid issues? And maybe this is why her PTH level is elevated? She told me to ask if this could be related?

## 2012-04-12 MED ORDER — MEDROXYPROGESTERONE ACETATE 2.5 MG PO TABS: 2.5000 mg | ORAL_TABLET | Freq: Every day | ORAL | Status: DC

## 2012-04-12 NOTE — Addendum Note (Signed)
Addended by: Aura Camps on: 04/12/2012 04:11 PM   Modules accepted: Orders

## 2012-04-12 NOTE — Telephone Encounter (Signed)
Pt informed with the below, Rx sent. She will follow up in 4 weeks as directed.

## 2012-04-12 NOTE — Telephone Encounter (Signed)
Left message for pt to call.

## 2012-05-08 ENCOUNTER — Other Ambulatory Visit: Payer: Self-pay | Admitting: *Deleted

## 2012-05-08 MED ORDER — DIAZEPAM 5 MG PO TABS: 5.0000 mg | ORAL_TABLET | Freq: Every day | ORAL | Status: DC

## 2012-05-08 NOTE — Telephone Encounter (Signed)
rx called in [facility] 

## 2013-01-08 ENCOUNTER — Other Ambulatory Visit: Payer: Self-pay | Admitting: Obstetrics and Gynecology

## 2013-01-08 NOTE — Telephone Encounter (Signed)
Former patient of Dr. Timoteo Expose. Last CE was 12/28/11 so she is overdue by a week.  She has had refills on Diazepam for years with Dr. Reece Agar.  His 2007 office note states she uses "generic Valium for grinding her teeth."  I will ask appt desk to try to contact her to schedule CE.

## 2013-01-08 NOTE — Telephone Encounter (Signed)
Called into pharmacy

## 2013-01-23 ENCOUNTER — Telehealth: Payer: Self-pay | Admitting: *Deleted

## 2013-01-23 MED ORDER — MEDROXYPROGESTERONE ACETATE 2.5 MG PO TABS: 2.5000 mg | ORAL_TABLET | Freq: Every day | ORAL | Status: DC

## 2013-01-23 NOTE — Telephone Encounter (Signed)
Pt called requesting refill on provera 2.5 mg pt has annual scheduled on 02/28/13. 0 refills

## 2013-01-28 DIAGNOSIS — Z1231 Encounter for screening mammogram for malignant neoplasm of breast: Secondary | ICD-10-CM | POA: Diagnosis not present

## 2013-01-29 ENCOUNTER — Encounter: Payer: Self-pay | Admitting: Gynecology

## 2013-02-06 ENCOUNTER — Other Ambulatory Visit: Payer: Self-pay | Admitting: *Deleted

## 2013-02-06 DIAGNOSIS — R922 Inconclusive mammogram: Secondary | ICD-10-CM

## 2013-02-11 DIAGNOSIS — R51 Headache: Secondary | ICD-10-CM | POA: Diagnosis not present

## 2013-02-11 DIAGNOSIS — N6489 Other specified disorders of breast: Secondary | ICD-10-CM | POA: Diagnosis not present

## 2013-02-11 DIAGNOSIS — R252 Cramp and spasm: Secondary | ICD-10-CM | POA: Diagnosis not present

## 2013-02-11 DIAGNOSIS — E039 Hypothyroidism, unspecified: Secondary | ICD-10-CM | POA: Diagnosis not present

## 2013-02-13 ENCOUNTER — Encounter: Payer: Self-pay | Admitting: Gynecology

## 2013-02-14 ENCOUNTER — Other Ambulatory Visit: Payer: Self-pay | Admitting: *Deleted

## 2013-02-14 DIAGNOSIS — R922 Inconclusive mammogram: Secondary | ICD-10-CM

## 2013-02-28 ENCOUNTER — Encounter: Payer: Self-pay | Admitting: Gynecology

## 2013-03-08 DIAGNOSIS — Z23 Encounter for immunization: Secondary | ICD-10-CM | POA: Diagnosis not present

## 2013-03-19 ENCOUNTER — Telehealth: Payer: Self-pay | Admitting: *Deleted

## 2013-03-19 NOTE — Telephone Encounter (Signed)
Pt called requesting letter for jury duty pt has IBS, I told pt to check with GI.

## 2013-04-01 ENCOUNTER — Telehealth: Payer: Self-pay | Admitting: *Deleted

## 2013-04-01 ENCOUNTER — Encounter: Payer: Self-pay | Admitting: *Deleted

## 2013-04-01 ENCOUNTER — Encounter: Payer: Self-pay | Admitting: Gynecology

## 2013-04-01 ENCOUNTER — Ambulatory Visit (INDEPENDENT_AMBULATORY_CARE_PROVIDER_SITE_OTHER): Payer: Medicare Other | Admitting: Gynecology

## 2013-04-01 VITALS — BP 112/66 | Ht 64.0 in | Wt 127.0 lb

## 2013-04-01 DIAGNOSIS — N952 Postmenopausal atrophic vaginitis: Secondary | ICD-10-CM

## 2013-04-01 DIAGNOSIS — Z79890 Hormone replacement therapy (postmenopausal): Secondary | ICD-10-CM | POA: Diagnosis not present

## 2013-04-01 DIAGNOSIS — M810 Age-related osteoporosis without current pathological fracture: Secondary | ICD-10-CM

## 2013-04-01 MED ORDER — NONFORMULARY OR COMPOUNDED ITEM: Status: DC

## 2013-04-01 MED ORDER — MEDROXYPROGESTERONE ACETATE 2.5 MG PO TABS: 2.5000 mg | ORAL_TABLET | Freq: Every day | ORAL | Status: DC

## 2013-04-01 MED ORDER — ESTRADIOL 0.75 MG/1.25 GM (0.06%) TD GEL: TRANSDERMAL | Status: DC

## 2013-04-01 NOTE — Progress Notes (Signed)
Annette Barker December 29, 1945 [id number]        67 y.o.  [id number] for annual exam.  Former patient of Dr. Eda Paschal. Several issues noted below.  Past medical history,surgical history, problem list, medications, allergies, family history and social history were all reviewed and documented in the EPIC chart.  ROS:  Performed and pertinent positives and negatives are included in the history, assessment and plan .  Exam: Kim assistant Filed Vitals:   04/01/13 1505  BP: 112/66  Height: 5\' 4"  (1.626 m)  Weight: 127 lb (57.607 kg)   General appearance  Normal Skin grossly normal Head/Neck normal with no cervical or supraclavicular adenopathy thyroid normal Lungs  clear Cardiac RR, without RMG Abdominal  soft, nontender, without masses, organomegaly or hernia Breasts  examined lying and sitting without masses, retractions, discharge or axillary adenopathy. Pelvic  Ext/BUS/vagina  normal with atrophic genital changes  Cervix  normal with atrophic changes  Uterus  anteverted, normal size, shape and contour, midline and mobile nontender   Adnexa  Without masses or tenderness    Anus and perineum  normal   Rectovaginal  normal sphincter tone without palpated masses or tenderness.    Assessment/Plan:  67 y.o. [id number] female for annual exam.   1. Postmenopausal/menopausal symptoms/atrophic genital changes. Patient has symptoms of hot flushes and sweats but also not feeling as well as she is not on HRT as far as his overall feeling of well being. Also having significant atrophic vaginitis symptoms to include dryness irritation and painful intercourse when she is not on her vaginal estrogen supplement. She is using Estrogel one half pump and medroxyprogesterone 2.5 mg nightly. Doing no bleeding. [facility] vaginal estrogen cream twice weekly per Dr. Eda Paschal.  I reviewed the whole issue of HRT with her to include the WHI study with increased risk of stroke, heart attack, DVT  and breast cancer. The ACOG and NAMS statements for lowest dose for the shortest period of time reviewed. Transdermal versus oral first-pass effect benefit discussed.  The patient also apparently has started on the Estrogel for bone protection as noted below. I also reviewed the issue of medroxyprogesterone and whether this is the culprit as far as breast cancer in the WHI study and if switching to a naturalized progesterone may be better. After lengthy discussion she wants to continue the current regimen. She may try to wean this coming winter. I refilled all of her medications x1 year. 2. Osteoporosis. DEXA 2013 T score -2.5. Dr. Eda Paschal had a lengthy discussion with her as evidenced 02/08/2012 note. She ultimately decided to start on HRT as her treatment regimen. I discussed with her that it is not intended to replace more effective treatment such as bisphosphonates and she understands this but prefers just to continue the present regimen and repeat her DEXA in one year which will be a 2 year interval. Increased calcium vitamin D reviewed. 3. Pap smear 2012. No Pap smear done today. No history of significant abnormal Pap smears. Plan repeat Pap smear next year at 3 your interval. 4. Mammography 01/2013. Continue annual mammography. SBE monthly reviewed. 5. Colonoscopy 3-4 years ago. Repeat that there are recommended interval. 6. Health maintenance. Patient actively being seen by her primary physician who does her routine blood work. The blood work done today. Followup one year, sooner as needed.  Note: This document was prepared with digital dictation and possible smart phrase technology. Any transcriptional errors that result from this process are unintentional.   Colin Broach  P MD, 4:14 PM 04/01/2013

## 2013-04-01 NOTE — Telephone Encounter (Signed)
rx called in and letter will be given to pt.

## 2013-04-01 NOTE — Patient Instructions (Signed)
followup in one year, sooner as needed. 

## 2013-04-01 NOTE — Telephone Encounter (Signed)
Message copied by Aura Camps on Mon Apr 01, 2013  4:36 PM ------      Message from: Dara Lords      Created: Mon Apr 01, 2013  4:20 PM       Patient needs:      #1 letter for jury duty. Please excuse from jury duty 04/26/2013 due to a medical condition which prohibits prolonged confinement. Sincerely      #2 [facility] ref[facility] her vaginal estrogen cream to be used twice weekly. Check to see about getting her a three-month supply at a time with one year refill. ------

## 2013-04-02 ENCOUNTER — Encounter: Payer: Self-pay | Admitting: Obstetrics and Gynecology

## 2013-04-02 NOTE — Telephone Encounter (Signed)
LETTER MAILED

## 2013-04-23 DIAGNOSIS — E039 Hypothyroidism, unspecified: Secondary | ICD-10-CM | POA: Diagnosis not present

## 2013-04-23 DIAGNOSIS — Z23 Encounter for immunization: Secondary | ICD-10-CM | POA: Diagnosis not present

## 2013-04-23 DIAGNOSIS — Z0000 Encounter for general adult medical examination without abnormal findings: Secondary | ICD-10-CM | POA: Diagnosis not present

## 2013-04-23 DIAGNOSIS — E7800 Pure hypercholesterolemia, unspecified: Secondary | ICD-10-CM | POA: Diagnosis not present

## 2013-04-25 ENCOUNTER — Ambulatory Visit
Admission: RE | Admit: 2013-04-25 | Discharge: 2013-04-25 | Disposition: A | Discharge status: Home / Self Care | Payer: Medicare Other | Source: Ambulatory Visit | Attending: Internal Medicine | Admitting: Internal Medicine

## 2013-04-25 ENCOUNTER — Other Ambulatory Visit: Payer: Self-pay | Admitting: Internal Medicine

## 2013-04-25 DIAGNOSIS — I809 Phlebitis and thrombophlebitis of unspecified site: Secondary | ICD-10-CM | POA: Diagnosis not present

## 2013-04-25 DIAGNOSIS — M79609 Pain in unspecified limb: Secondary | ICD-10-CM | POA: Diagnosis not present

## 2013-05-20 ENCOUNTER — Other Ambulatory Visit: Payer: Self-pay | Admitting: Gynecology

## 2013-05-20 MED ORDER — DIAZEPAM 5 MG PO TABS: ORAL_TABLET | ORAL | Status: DC

## 2013-05-20 NOTE — Telephone Encounter (Signed)
Pt was out of town in [place] and asked if Rx could be sent there. I called rx in and also let [facility] know that this had been done as well since [facility] sent refill request.

## 2013-05-20 NOTE — Telephone Encounter (Signed)
We did not discuss Valium at her office visit. I reviewed Dr. Verl Dicker last note he did not note that he was prescribing it or for what purpose. Check with the patient and see why she is taking this.

## 2013-05-20 NOTE — Telephone Encounter (Signed)
Pt said she takes 1/4 of tablet at bedtime nightly to help with grinding teeth at night. Pt said the grinding of teeth only started after menopause she doesn't use a whole pill.

## 2013-05-20 NOTE — Telephone Encounter (Signed)
Patient had annual in Nov.

## 2013-07-23 DIAGNOSIS — Z79899 Other long term (current) drug therapy: Secondary | ICD-10-CM | POA: Diagnosis not present

## 2013-07-23 DIAGNOSIS — E7800 Pure hypercholesterolemia, unspecified: Secondary | ICD-10-CM | POA: Diagnosis not present

## 2013-07-26 DIAGNOSIS — R350 Frequency of micturition: Secondary | ICD-10-CM | POA: Diagnosis not present

## 2013-09-11 ENCOUNTER — Telehealth: Payer: Self-pay | Admitting: *Deleted

## 2013-09-11 NOTE — Telephone Encounter (Signed)
Hair follicles to have hormone receptors and it can cause hair growth changes. We see this most dramatically during pregnancy. It is unlikely at the low dose that she's using that it would affect her hair particularly since she's been using it for a while. As far as weaning off that's a different issue. The [employer] of OB/GYN does recommend to try to wean off at intervals. If she does try to wean off I usually recommend just to stop it. She may have a transient flare and hot flushes to try to put up with it because they usually resolve relatively quickly.

## 2013-09-11 NOTE — Telephone Encounter (Signed)
Pt said that her cosmologist has noticed more than normal hair loss, pt is currently using estrace vaginal cream, estrogel topically, provera 2.5 daily. I explained to patient we can't definitely say yes it the HRT causing hair loss. She would like to know if other patient have complained about this? And if she should try to wean off? If wean off how should this be done? Please advise

## 2013-09-11 NOTE — Telephone Encounter (Signed)
I called patient back and read Dr. Dorette Grate paragraph in her voice mail as his answer to her question.

## 2014-01-28 ENCOUNTER — Telehealth: Payer: Self-pay

## 2014-01-28 ENCOUNTER — Telehealth: Payer: Self-pay | Admitting: *Deleted

## 2014-01-28 MED ORDER — DIAZEPAM 5 MG PO TABS: ORAL_TABLET | ORAL | Status: DC

## 2014-01-28 NOTE — Telephone Encounter (Signed)
Patient called and left message that was broken and difficult to understand everything. I did understand that she is needing refill and she indicated we had been uncooperative in refilling it.  I left her a message and told her that we have not been contacted since 05/20/13 for a refill from her pharmacy and that we have not denied anything lately.  I am happy to work with her as she is current on CE.  I asked her to call me back and leave the name of medication she needs refilled. She did state she is leaving town late today and would like to get it before she goes. I will wait to hear back from her.

## 2014-01-28 NOTE — Telephone Encounter (Signed)
rx called in, pt aware 

## 2014-01-28 NOTE — Telephone Encounter (Signed)
#  30 should carry her through to her annual and she uses one quarter tablet at bedtime

## 2014-01-28 NOTE — Telephone Encounter (Signed)
Patient called back and spoke with Anderson Malta and this was handled for her.

## 2014-01-28 NOTE — Telephone Encounter (Signed)
Pt called requesting refill on Valium 5 mg annual due in November. Okay to fill? Please advise

## 2014-03-24 ENCOUNTER — Encounter: Payer: Self-pay | Admitting: Gynecology

## 2014-03-25 DIAGNOSIS — H43813 Vitreous degeneration, bilateral: Secondary | ICD-10-CM | POA: Diagnosis not present

## 2014-04-03 ENCOUNTER — Ambulatory Visit (INDEPENDENT_AMBULATORY_CARE_PROVIDER_SITE_OTHER): Payer: Medicare Other

## 2014-04-03 ENCOUNTER — Other Ambulatory Visit: Payer: Self-pay | Admitting: Women's Health

## 2014-04-03 ENCOUNTER — Ambulatory Visit (INDEPENDENT_AMBULATORY_CARE_PROVIDER_SITE_OTHER): Payer: Medicare Other | Admitting: Women's Health

## 2014-04-03 VITALS — BP 110/80 | Ht 63.0 in | Wt 126.0 lb

## 2014-04-03 DIAGNOSIS — N8300 Follicular cyst of ovary, unspecified side: Secondary | ICD-10-CM

## 2014-04-03 DIAGNOSIS — R1032 Left lower quadrant pain: Secondary | ICD-10-CM

## 2014-04-03 DIAGNOSIS — N898 Other specified noninflammatory disorders of vagina: Secondary | ICD-10-CM

## 2014-04-03 DIAGNOSIS — R1909 Other intra-abdominal and pelvic swelling, mass and lump: Secondary | ICD-10-CM

## 2014-04-03 DIAGNOSIS — B3731 Acute candidiasis of vulva and vagina: Secondary | ICD-10-CM

## 2014-04-03 DIAGNOSIS — Z1502 Genetic susceptibility to malignant neoplasm of ovary: Secondary | ICD-10-CM

## 2014-04-03 DIAGNOSIS — Z800 Family history of malignant neoplasm of digestive organs: Secondary | ICD-10-CM | POA: Diagnosis not present

## 2014-04-03 DIAGNOSIS — D271 Benign neoplasm of left ovary: Secondary | ICD-10-CM

## 2014-04-03 DIAGNOSIS — B373 Candidiasis of vulva and vagina: Secondary | ICD-10-CM

## 2014-04-03 LAB — URINALYSIS W MICROSCOPIC + REFLEX CULTURE
BILIRUBIN URINE: NEGATIVE
Casts: NONE SEEN
Crystals: NONE SEEN
GLUCOSE, UA: NEGATIVE mg/dL
Ketones, ur: NEGATIVE mg/dL
LEUKOCYTES UA: NEGATIVE
Nitrite: NEGATIVE
Protein, ur: NEGATIVE mg/dL
Specific Gravity, Urine: <1.005 — ABNORMAL LOW (ref 1.005–1.030)
Urobilinogen, UA: 0.2 mg/dL (ref 0.0–1.0)
WBC, UA: NONE SEEN WBC/hpf (ref <3)
pH: 5.5 (ref 5.0–8.0)

## 2014-04-03 LAB — WET PREP FOR TRICH, YEAST, CLUE
Clue Cells Wet Prep HPF POC: NONE SEEN
Trich, Wet Prep: NONE SEEN

## 2014-04-03 MED ORDER — FLUCONAZOLE 150 MG PO TABS: 150.0000 mg | ORAL_TABLET | Freq: Once | ORAL | Status: DC

## 2014-04-03 NOTE — Progress Notes (Signed)
Patient ID: Annette Barker, female   DOB: October 29, 1945, 68 y.o.   MRN: [id number] Presents with complaint of left lower quadrant discomfort for about 1 week, constant dull ache. History of IBS, had constipation last week, has had several normal BMs this week. Sister with peritoneal/ovarian cancer diagnosed at age 11 which concerns her. Slight increase frequency and urgency with urination, denies pain or burning. Scant discharge with some vaginal irritation. Denies nausea/vomiting or fever. Reports having bad breath, change. Raking leaves today.  Exam: Appears well, no halitosis noted, no CVAT, abdomen soft, nontender, no rebound or radiation on right,  slight discomfort on left lower quadrant. External genitalia within normal limits, vaginal atrophy, speculum exam scant discharge wet prep positive for few yeast. Bimanual not done having ultrasound. UA: Trace blood, no wbc's, 0-2 RBCs, rare bacteria Ultrasound:Transvaginal anteverted uterus homogeneous. Right and left ovary atrophic normal echo thick-walled 6 mm follicle avascular. With negative cul-de-sac. Noted postvoid residual in the bladder. Negative cul-de-sac. Endometrium 2.7 mm  Left lower quadrant pain 6 mm thick-walled avascular follicle left ovary  Plan: CA 125 for reassurance. Follow-up with GI - Dr Annette Barker. Keep scheduled  annual exam appointment with Dr. [name] next week.

## 2014-04-04 ENCOUNTER — Encounter: Payer: Self-pay | Admitting: Women's Health

## 2014-04-04 ENCOUNTER — Ambulatory Visit (HOSPITAL_COMMUNITY)
Admission: RE | Admit: 2014-04-04 | Discharge: 2014-04-04 | Disposition: A | Discharge status: Home / Self Care | Payer: Medicare Other | Source: Ambulatory Visit | Attending: Geriatric Medicine | Admitting: Geriatric Medicine

## 2014-04-04 ENCOUNTER — Other Ambulatory Visit (HOSPITAL_COMMUNITY): Payer: Self-pay | Admitting: Geriatric Medicine

## 2014-04-04 ENCOUNTER — Telehealth: Payer: Self-pay | Admitting: *Deleted

## 2014-04-04 DIAGNOSIS — Z1502 Genetic susceptibility to malignant neoplasm of ovary: Secondary | ICD-10-CM | POA: Diagnosis not present

## 2014-04-04 DIAGNOSIS — R109 Unspecified abdominal pain: Secondary | ICD-10-CM

## 2014-04-04 DIAGNOSIS — R1909 Other intra-abdominal and pelvic swelling, mass and lump: Secondary | ICD-10-CM | POA: Diagnosis not present

## 2014-04-04 DIAGNOSIS — R1032 Left lower quadrant pain: Secondary | ICD-10-CM | POA: Insufficient documentation

## 2014-04-04 NOTE — Telephone Encounter (Signed)
Message left

## 2014-04-04 NOTE — Telephone Encounter (Signed)
Pt called and left message in voicemail that she was in [place] double over in pain, asking for CA 125 results from [place] 04/03/14. I called pt back and left a voicemail that best to go to ER if pain is that severe. And that CA 125 result is not back yet.

## 2014-04-04 NOTE — Telephone Encounter (Signed)
Annette Barker pt called this am and c/o the below. I just wanted to let you know.

## 2014-04-04 NOTE — Telephone Encounter (Signed)
Telephone call, spoke with husband  reports pain has increased, currently in the process of driving back to [place], instructed to go to [facility], he requested to have [place] call to alert that she would be coming. [facility] called and notified that patient was coming, negative GYN ultrasound.

## 2014-04-05 LAB — CA 125: CA 125: 13 U/mL (ref <35)

## 2014-04-07 ENCOUNTER — Telehealth: Payer: Self-pay

## 2014-04-07 NOTE — Telephone Encounter (Signed)
Patient said that when she left here she did not go to ER as you directed but went to see Dr. Felipa Eth.  Dr. Felipa Eth sent her to [facility] and she had an u/s of her kidneys.  She said it did not show any stones but did show her bladder was distended.  Dr. Felipa Eth also ordered blood work.  She told Dr. Carlyle Lipa office to send you the info to keep you abreast.  She said to thank you again for seeing her on such short notice Friday. She said she was very anxious and thought she had ovarian cancer.  She said she could not thank you enough.

## 2014-04-07 NOTE — Telephone Encounter (Signed)
-----   Message from Huel Cote, NP sent at 04/07/2014  7:49 AM EST ----- Please call and review CA 125 neg, low, which is good (sister hx of ovarian ca). Ask if feeling better, let me know if not.

## 2014-04-08 ENCOUNTER — Ambulatory Visit
Admission: RE | Admit: 2014-04-08 | Discharge: 2014-04-08 | Disposition: A | Discharge status: Home / Self Care | Payer: Medicare Other | Source: Ambulatory Visit | Attending: Geriatric Medicine | Admitting: Geriatric Medicine

## 2014-04-08 ENCOUNTER — Other Ambulatory Visit: Payer: Self-pay | Admitting: Geriatric Medicine

## 2014-04-08 DIAGNOSIS — R1084 Generalized abdominal pain: Secondary | ICD-10-CM

## 2014-04-08 DIAGNOSIS — K5900 Constipation, unspecified: Secondary | ICD-10-CM | POA: Diagnosis not present

## 2014-04-08 DIAGNOSIS — R1032 Left lower quadrant pain: Secondary | ICD-10-CM | POA: Diagnosis not present

## 2014-04-08 DIAGNOSIS — R312 Other microscopic hematuria: Secondary | ICD-10-CM | POA: Diagnosis not present

## 2014-04-08 MED ORDER — IOHEXOL 300 MG/ML  SOLN
100.0000 mL | Freq: Once | INTRAMUSCULAR | Status: AC | PRN
  Administered 2014-04-08: 100 mL via INTRAVENOUS

## 2014-04-08 MED ORDER — IOHEXOL 300 MG/ML  SOLN
30.0000 mL | Freq: Once | INTRAMUSCULAR | Status: AC | PRN
  Administered 2014-04-08: 30 mL via ORAL

## 2014-04-09 ENCOUNTER — Encounter: Payer: Self-pay | Admitting: Gynecology

## 2014-04-22 DIAGNOSIS — Z23 Encounter for immunization: Secondary | ICD-10-CM | POA: Diagnosis not present

## 2014-04-29 DIAGNOSIS — Z1389 Encounter for screening for other disorder: Secondary | ICD-10-CM | POA: Diagnosis not present

## 2014-04-29 DIAGNOSIS — K219 Gastro-esophageal reflux disease without esophagitis: Secondary | ICD-10-CM | POA: Diagnosis not present

## 2014-04-29 DIAGNOSIS — M8580 Other specified disorders of bone density and structure, unspecified site: Secondary | ICD-10-CM | POA: Diagnosis not present

## 2014-04-29 DIAGNOSIS — M549 Dorsalgia, unspecified: Secondary | ICD-10-CM | POA: Diagnosis not present

## 2014-04-29 DIAGNOSIS — E780 Pure hypercholesterolemia: Secondary | ICD-10-CM | POA: Diagnosis not present

## 2014-04-29 DIAGNOSIS — Z0000 Encounter for general adult medical examination without abnormal findings: Secondary | ICD-10-CM | POA: Diagnosis not present

## 2014-04-29 DIAGNOSIS — Z79899 Other long term (current) drug therapy: Secondary | ICD-10-CM | POA: Diagnosis not present

## 2014-04-29 DIAGNOSIS — E039 Hypothyroidism, unspecified: Secondary | ICD-10-CM | POA: Diagnosis not present

## 2014-04-30 ENCOUNTER — Telehealth: Payer: Self-pay | Admitting: *Deleted

## 2014-04-30 ENCOUNTER — Other Ambulatory Visit: Payer: Self-pay

## 2014-04-30 MED ORDER — NONFORMULARY OR COMPOUNDED ITEM: Status: DC

## 2014-04-30 MED ORDER — MEDROXYPROGESTERONE ACETATE 2.5 MG PO TABS: 2.5000 mg | ORAL_TABLET | Freq: Every day | ORAL | Status: DC

## 2014-04-30 MED ORDER — ESTRADIOL 0.75 MG/1.25 GM (0.06%) TD GEL: TRANSDERMAL | Status: DC

## 2014-04-30 NOTE — Addendum Note (Signed)
Addended by: Thamas Jaegers on: 04/30/2014 12:54 PM   Modules accepted: Orders

## 2014-04-30 NOTE — Telephone Encounter (Signed)
The below was incorrect pt needs vaginal estrogen 0.02% cream insert twice weekly Rx this was called into pharmacy

## 2014-04-30 NOTE — Telephone Encounter (Signed)
Pt called requesting a refill on HRT estrogel one half pump and medroxyprogesterone 2.5 mg nightly, annual scheduled on 06/10/14. Both Rx sent.

## 2014-06-10 ENCOUNTER — Other Ambulatory Visit (HOSPITAL_COMMUNITY)
Admission: RE | Admit: 2014-06-10 | Discharge: 2014-06-10 | Disposition: A | Discharge status: Home / Self Care | Payer: Medicare Other | Source: Ambulatory Visit | Attending: Gynecology | Admitting: Gynecology

## 2014-06-10 ENCOUNTER — Encounter: Payer: Self-pay | Admitting: Gynecology

## 2014-06-10 ENCOUNTER — Ambulatory Visit (INDEPENDENT_AMBULATORY_CARE_PROVIDER_SITE_OTHER): Payer: Medicare Other | Admitting: Gynecology

## 2014-06-10 ENCOUNTER — Telehealth: Payer: Self-pay | Admitting: *Deleted

## 2014-06-10 VITALS — BP 114/70 | Ht 64.0 in | Wt 123.0 lb

## 2014-06-10 DIAGNOSIS — Z01419 Encounter for gynecological examination (general) (routine) without abnormal findings: Secondary | ICD-10-CM

## 2014-06-10 DIAGNOSIS — Z79890 Hormone replacement therapy (postmenopausal): Secondary | ICD-10-CM | POA: Diagnosis not present

## 2014-06-10 DIAGNOSIS — N952 Postmenopausal atrophic vaginitis: Secondary | ICD-10-CM

## 2014-06-10 DIAGNOSIS — Z124 Encounter for screening for malignant neoplasm of cervix: Secondary | ICD-10-CM

## 2014-06-10 DIAGNOSIS — M810 Age-related osteoporosis without current pathological fracture: Secondary | ICD-10-CM | POA: Diagnosis not present

## 2014-06-10 MED ORDER — DIAZEPAM 5 MG PO TABS: ORAL_TABLET | ORAL | Status: DC

## 2014-06-10 MED ORDER — NONFORMULARY OR COMPOUNDED ITEM: Status: DC

## 2014-06-10 MED ORDER — MEDROXYPROGESTERONE ACETATE 2.5 MG PO TABS: 2.5000 mg | ORAL_TABLET | Freq: Every day | ORAL | Status: DC

## 2014-06-10 MED ORDER — ESTRADIOL 0.75 MG/1.25 GM (0.06%) TD GEL: TRANSDERMAL | Status: DC

## 2014-06-10 NOTE — Progress Notes (Signed)
Annette Barker 1946/03/04 [id number]        69 y.o.  [id number] for breast and pelvic exam. Several issues noted below.  Past medical history,surgical history, problem list, medications, allergies, family history and social history were all reviewed and documented as reviewed in the EPIC chart.  ROS:  Performed with pertinent positives and negatives included in the history, assessment and plan.   Additional significant findings :  none   Exam: Kim Counsellor Vitals:   06/10/14 1436  BP: 114/70  Height: 5\' 4"  (1.626 m)  Weight: 123 lb (55.792 kg)   General appearance:  Normal affect, orientation and appearance. Skin: Grossly normal HEENT: Without gross lesions.  No cervical or supraclavicular adenopathy. Thyroid normal.  Lungs:  Clear without wheezing, rales or rhonchi Cardiac: RR, without RMG Abdominal:  Soft, nontender, without masses, guarding, rebound, organomegaly or hernia Breasts:  Examined lying and sitting without masses, retractions, discharge or axillary adenopathy. Pelvic:  Ext/BUS/vagina with generalized atrophic changes  Cervix atrophic flush with upper vagina  Uterus axial to anteverted, normal size, shape and contour, midline and mobile nontender   Adnexa  Without masses or tenderness    Anus and perineum  Normal   Rectovaginal  Normal sphincter tone without palpated masses or tenderness.    Assessment/Plan:  70 y.o. [id number] female for breast and pelvic exam.   1. Postmenopausal/atrophic genital changes/HRT. Patient continues on estrogen gel daily and Provera 2.5 mg as well as estradiol vaginal cream twice weekly. I previously have reviewed the risks of her HRT regimen as noted in my 04/01/2013 note.  I again reviewed the whole issue of HRT and risks to include the WHI study with increased risk of stroke, heart attack, DVT and breast cancer. The ACOG and NAMS statements for lowest dose for shortest period of time. Recommendations to try weaning and see how she  does reviewed. Patient agrees with trying to wean but may continue if she has unacceptable side effects. She has no history of vaginal bleeding. She knows to report any vaginal bleeding. Refill on the estrogen gel and Provera 1 year as well as the estradiol vaginal cream [facility] twice weekly. 2. Osteoporosis. DEXA 12/2011 T score -2.5. Previously discussed options for treatments and she declined treatment. Recommend repeat DEXA now at a 2 year interval. Patient agrees to schedule. Increase calcium vitamin D reviewed. 3. Mammography 01/2013.  Patient overdo it and knows the need to schedule. SBE monthly reviewed. 4. Pap smear 2012. Pap smear done today. No history of abnormal Pap smears previously. 5. Colonoscopy 2010. Repeat at their recommended interval. 6. Valium refill. Patient uses one quarter of a 5 mg Valium at bedtime which apparently was started by Dr. Verl Blalock for grinding of her teeth. She does well with this and asked if I would refill this for her. Valium 5 mg #30 with 3 refills provided. 7. Health maintenance. No routine blood work done as she reports this done through her primary physician. Follow up or bone density otherwise 1 year for annual exam.     [name] MD, 3:39 PM 06/10/2014

## 2014-06-10 NOTE — Patient Instructions (Addendum)
Try to wean off of hormone replacement therapy this coming year. Call me if you have any issues or questions. Follow up for bone density as scheduled.  Schedule your mammogram  You may obtain a copy of any labs that were done today by logging onto MyChart as outlined in the instructions provided with your AVS (after visit summary). The office will not call with normal lab results but certainly if there are any significant abnormalities then we will contact you.   Health Maintenance, Female A healthy lifestyle and preventative care can promote health and wellness.  Maintain regular health, dental, and eye exams.  Eat a healthy diet. Foods like vegetables, fruits, whole grains, low-fat dairy products, and lean protein foods contain the nutrients you need without too many calories. Decrease your intake of foods high in solid fats, added sugars, and salt. Get information about a proper diet from your caregiver, if necessary.  Regular physical exercise is one of the most important things you can do for your health. Most adults should get at least 150 minutes of moderate-intensity exercise (any activity that increases your heart rate and causes you to sweat) each week. In addition, most adults need muscle-strengthening exercises on 2 or more days a week.   Maintain a healthy weight. The body mass index (BMI) is a screening tool to identify possible weight problems. It provides an estimate of body fat based on height and weight. Your caregiver can help determine your BMI, and can help you achieve or maintain a healthy weight. For adults 20 years and older:  A BMI below 18.5 is considered underweight.  A BMI of 18.5 to 24.9 is normal.  A BMI of 25 to 29.9 is considered overweight.  A BMI of 30 and above is considered obese.  Maintain normal blood lipids and cholesterol by exercising and minimizing your intake of saturated fat. Eat a balanced diet with plenty of fruits and vegetables. Blood tests  for lipids and cholesterol should begin at age 46 and be repeated every 5 years. If your lipid or cholesterol levels are high, you are over 50, or you are a high risk for heart disease, you may need your cholesterol levels checked more frequently.Ongoing high lipid and cholesterol levels should be treated with medicines if diet and exercise are not effective.  If you smoke, find out from your caregiver how to quit. If you do not use tobacco, do not start.  Lung cancer screening is recommended for adults aged 18 80 years who are at high risk for developing lung cancer because of a history of smoking. Yearly low-dose computed tomography (CT) is recommended for people who have at least a 30-pack-year history of smoking and are a current smoker or have quit within the past 15 years. A pack year of smoking is smoking an average of 1 pack of cigarettes a day for 1 year (for example: 1 pack a day for 30 years or 2 packs a day for 15 years). Yearly screening should continue until the smoker has stopped smoking for at least 15 years. Yearly screening should also be stopped for people who develop a health problem that would prevent them from having lung cancer treatment.  If you are pregnant, do not drink alcohol. If you are breastfeeding, be very cautious about drinking alcohol. If you are not pregnant and choose to drink alcohol, do not exceed 1 drink per day. One drink is considered to be 12 ounces (355 mL) of beer, 5 ounces (148 mL)  of wine, or 1.5 ounces (44 mL) of liquor.  Avoid use of street drugs. Do not share needles with anyone. Ask for help if you need support or instructions about stopping the use of drugs.  High blood pressure causes heart disease and increases the risk of stroke. Blood pressure should be checked at least every 1 to 2 years. Ongoing high blood pressure should be treated with medicines, if weight loss and exercise are not effective.  If you are 33 to 67 years old, ask your caregiver  if you should take aspirin to prevent strokes.  Diabetes screening involves taking a blood sample to check your fasting blood sugar level. This should be done once every 3 years, after age 53, if you are within normal weight and without risk factors for diabetes. Testing should be considered at a younger age or be carried out more frequently if you are overweight and have at least 1 risk factor for diabetes.  Breast cancer screening is essential preventative care for women. You should practice "breast self-awareness." This means understanding the normal appearance and feel of your breasts and may include breast self-examination. Any changes detected, no matter how small, should be reported to a caregiver. Women in their 46s and 30s should have a clinical breast exam (CBE) by a caregiver as part of a regular health exam every 1 to 3 years. After age 44, women should have a CBE every year. Starting at age 54, women should consider having a mammogram (breast X-ray) every year. Women who have a family history of breast cancer should talk to their caregiver about genetic screening. Women at a high risk of breast cancer should talk to their caregiver about having an MRI and a mammogram every year.  Breast cancer gene (BRCA)-related cancer risk assessment is recommended for women who have family members with BRCA-related cancers. BRCA-related cancers include breast, ovarian, tubal, and peritoneal cancers. Having family members with these cancers may be associated with an increased risk for harmful changes (mutations) in the breast cancer genes BRCA1 and BRCA2. Results of the assessment will determine the need for genetic counseling and BRCA1 and BRCA2 testing.  The Pap test is a screening test for cervical cancer. Women should have a Pap test starting at age 16. Between ages 5 and 80, Pap tests should be repeated every 2 years. Beginning at age 78, you should have a Pap test every 3 years as long as the past 3 Pap  tests have been normal. If you had a hysterectomy for a problem that was not cancer or a condition that could lead to cancer, then you no longer need Pap tests. If you are between ages 37 and 73, and you have had normal Pap tests going back 10 years, you no longer need Pap tests. If you have had past treatment for cervical cancer or a condition that could lead to cancer, you need Pap tests and screening for cancer for at least 20 years after your treatment. If Pap tests have been discontinued, risk factors (such as a new sexual partner) need to be reassessed to determine if screening should be resumed. Some women have medical problems that increase the chance of getting cervical cancer. In these cases, your caregiver may recommend more frequent screening and Pap tests.  The human papillomavirus (HPV) test is an additional test that may be used for cervical cancer screening. The HPV test looks for the virus that can cause the cell changes on the cervix. The cells collected  during the Pap test can be tested for HPV. The HPV test could be used to screen women aged 56 years and older, and should be used in women of any age who have unclear Pap test results. After the age of 44, women should have HPV testing at the same frequency as a Pap test.  Colorectal cancer can be detected and often prevented. Most routine colorectal cancer screening begins at the age of 28 and continues through age 30. However, your caregiver may recommend screening at an earlier age if you have risk factors for colon cancer. On a yearly basis, your caregiver may provide home test kits to check for hidden blood in the stool. Use of a small camera at the end of a tube, to directly examine the colon (sigmoidoscopy or colonoscopy), can detect the earliest forms of colorectal cancer. Talk to your caregiver about this at age 26, when routine screening begins. Direct examination of the colon should be repeated every 5 to 10 years through age 34,  unless early forms of pre-cancerous polyps or small growths are found.  Hepatitis C blood testing is recommended for all people born from [birth year] through 1963 and any individual with known risks for hepatitis C.  Practice safe sex. Use condoms and avoid high-risk sexual practices to reduce the spread of sexually transmitted infections (STIs). Sexually active women aged 41 and younger should be checked for Chlamydia, which is a common sexually transmitted infection. Older women with new or multiple partners should also be tested for Chlamydia. Testing for other STIs is recommended if you are sexually active and at increased risk.  Osteoporosis is a disease in which the bones lose minerals and strength with aging. This can result in serious bone fractures. The risk of osteoporosis can be identified using a bone density scan. Women ages 10 and over and women at risk for fractures or osteoporosis should discuss screening with their caregivers. Ask your caregiver whether you should be taking a calcium supplement or vitamin D to reduce the rate of osteoporosis.  Menopause can be associated with physical symptoms and risks. Hormone replacement therapy is available to decrease symptoms and risks. You should talk to your caregiver about whether hormone replacement therapy is right for you.  Use sunscreen. Apply sunscreen liberally and repeatedly throughout the day. You should seek shade when your shadow is shorter than you. Protect yourself by wearing long sleeves, pants, a wide-brimmed hat, and sunglasses year round, whenever you are outdoors.  Notify your caregiver of new moles or changes in moles, especially if there is a change in shape or color. Also notify your caregiver if a mole is larger than the size of a pencil eraser.  Stay current with your immunizations. Document Released: 11/22/2010 Document Revised: 09/03/2012 Document Reviewed: 11/22/2010 [employer] Patient Information 2014 [place].

## 2014-06-10 NOTE — Telephone Encounter (Signed)
Rx called in, Rx for provera  2.5 mg canceled.

## 2014-06-10 NOTE — Telephone Encounter (Signed)
-----   Message from [name], MD sent at 06/10/2014  3:37 PM EST ----- [facility]. I think I sent him a Provera 2.5 mg prescription to them by mistake. Cancel that. I do want the estradiol vaginal cream prescription they have on file to refill 1 year

## 2014-06-10 NOTE — Addendum Note (Signed)
Addended by: Nelva Nay on: 06/10/2014 03:51 PM   Modules accepted: Orders

## 2014-06-11 LAB — URINALYSIS W MICROSCOPIC + REFLEX CULTURE
BACTERIA UA: NONE SEEN
BILIRUBIN URINE: NEGATIVE
CASTS: NONE SEEN
Crystals: NONE SEEN
Glucose, UA: NEGATIVE mg/dL
HGB URINE DIPSTICK: NEGATIVE
Ketones, ur: NEGATIVE mg/dL
Nitrite: NEGATIVE
PH: 7 (ref 5.0–8.0)
PROTEIN: NEGATIVE mg/dL
Specific Gravity, Urine: 1.007 (ref 1.005–1.030)
UROBILINOGEN UA: 0.2 mg/dL (ref 0.0–1.0)

## 2014-06-12 LAB — URINE CULTURE: Colony Count: 50000

## 2014-06-12 LAB — CYTOLOGY - PAP

## 2014-06-27 ENCOUNTER — Ambulatory Visit (INDEPENDENT_AMBULATORY_CARE_PROVIDER_SITE_OTHER): Payer: Medicare Other | Admitting: Gynecology

## 2014-06-27 ENCOUNTER — Encounter: Payer: Self-pay | Admitting: Gynecology

## 2014-06-27 DIAGNOSIS — N3000 Acute cystitis without hematuria: Secondary | ICD-10-CM | POA: Diagnosis not present

## 2014-06-27 DIAGNOSIS — R300 Dysuria: Secondary | ICD-10-CM | POA: Diagnosis not present

## 2014-06-27 LAB — URINALYSIS W MICROSCOPIC + REFLEX CULTURE
Bilirubin Urine: NEGATIVE
CASTS: NONE SEEN
CRYSTALS: NONE SEEN
Glucose, UA: NEGATIVE mg/dL
Ketones, ur: NEGATIVE mg/dL
Nitrite: NEGATIVE
Protein, ur: 30 mg/dL — AB
SPECIFIC GRAVITY, URINE: 1.01 (ref 1.005–1.030)
UROBILINOGEN UA: 0.2 mg/dL (ref 0.0–1.0)
pH: 6 (ref 5.0–8.0)

## 2014-06-27 NOTE — Progress Notes (Signed)
Annette Barker 1946-05-21 [id number]        69 y.o.  [id number] Presents having noticed increasing frequency and dysuria with mild suprapubic discomfort starting last night. No fever chills nausea vomiting diarrhea constipation. History of occasional UTIs. No overt precipitating events.  Past medical history,surgical history, problem list, medications, allergies, family history and social history were all reviewed and documented in the EPIC chart.  Directed ROS with pertinent positives and negatives documented in the history of present illness/assessment and plan.  Exam: Kim assistant General appearance:  Normal Spine straight without CVA tenderness. Abdomen soft nontender without masses guarding rebound   Assessment/Plan:  69 y.o. [id number] with symptoms and urinalysis consistent with UTI. Patient actually has ciprofloxacin 500 mg tablets #6 left over from a prior treatment for URI. Not expired. Patient will take 1 tablet twice daily for 3 days. Follow up if symptoms persist, worsen or recur.     [name] MD, 2:32 PM 06/27/2014

## 2014-06-27 NOTE — Patient Instructions (Signed)
Take her ciprofloxacin antibiotic twice daily for 3 days. Follow up if your symptoms persist, worsen or recur.  Urinary Tract Infection Urinary tract infections (UTIs) can develop anywhere along your urinary tract. Your urinary tract is your body's drainage system for removing wastes and extra water. Your urinary tract includes two kidneys, two ureters, a bladder, and a urethra. Your kidneys are a pair of bean-shaped organs. Each kidney is about the size of your fist. They are located below your ribs, one on each side of your spine. CAUSES Infections are caused by microbes, which are microscopic organisms, including fungi, viruses, and bacteria. These organisms are so small that they can only be seen through a microscope. Bacteria are the microbes that most commonly cause UTIs. SYMPTOMS  Symptoms of UTIs may vary by age and gender of the patient and by the location of the infection. Symptoms in young women typically include a frequent and intense urge to urinate and a painful, burning feeling in the bladder or urethra during urination. Older women and men are more likely to be tired, shaky, and weak and have muscle aches and abdominal pain. A fever may mean the infection is in your kidneys. Other symptoms of a kidney infection include pain in your back or sides below the ribs, nausea, and vomiting. DIAGNOSIS To diagnose a UTI, your caregiver will ask you about your symptoms. Your caregiver also will ask to provide a urine sample. The urine sample will be tested for bacteria and white blood cells. White blood cells are made by your body to help fight infection. TREATMENT  Typically, UTIs can be treated with medication. Because most UTIs are caused by a bacterial infection, they usually can be treated with the use of antibiotics. The choice of antibiotic and length of treatment depend on your symptoms and the type of bacteria causing your infection. HOME CARE INSTRUCTIONS  If you were prescribed  antibiotics, take them exactly as your caregiver instructs you. Finish the medication even if you feel better after you have only taken some of the medication.  Drink enough water and fluids to keep your urine clear or pale yellow.  Avoid caffeine, tea, and carbonated beverages. They tend to irritate your bladder.  Empty your bladder often. Avoid holding urine for long periods of time.  Empty your bladder before and after sexual intercourse.  After a bowel movement, women should cleanse from front to back. Use each tissue only once. SEEK MEDICAL CARE IF:   You have back pain.  You develop a fever.  Your symptoms do not begin to resolve within 3 days. SEEK IMMEDIATE MEDICAL CARE IF:   You have severe back pain or lower abdominal pain.  You develop chills.  You have nausea or vomiting.  You have continued burning or discomfort with urination. MAKE SURE YOU:   Understand these instructions.  Will watch your condition.  Will get help right away if you are not doing well or get worse. Document Released: 02/16/2005 Document Revised: 11/08/2011 Document Reviewed: 06/17/2011 [facility] Patient Information 2015 [place], [place]. This information is not intended to replace advice given to you by your health care provider. Make sure you discuss any questions you have with your health care provider.

## 2014-06-29 LAB — URINE CULTURE: Colony Count: 40000

## 2014-09-10 ENCOUNTER — Telehealth: Payer: Self-pay | Admitting: *Deleted

## 2014-09-10 NOTE — Telephone Encounter (Signed)
TC message left

## 2014-09-10 NOTE — Telephone Encounter (Signed)
Pt called with questions about ultrasound last year? I left message for pt to call.

## 2014-09-10 NOTE — Telephone Encounter (Signed)
Pt had ultrasound done on OV 04/03/14 states the cramping is still there on left side. Pt asked if you would call her to speak about this with her at [phone]. Please advise

## 2014-09-12 NOTE — Telephone Encounter (Signed)
Message left

## 2014-09-16 ENCOUNTER — Other Ambulatory Visit: Payer: Self-pay | Admitting: Women's Health

## 2014-09-16 DIAGNOSIS — R1032 Left lower quadrant pain: Secondary | ICD-10-CM

## 2014-09-16 NOTE — Telephone Encounter (Signed)
Telephone call, states has had continued left lower quadrant pain, pain is similar to when ultrasound was done that caused pain at the left ovary. CA 125 was low, had a 6 mm oligo, has history of IBS. Will check ultrasound. Switch to scheduling.

## 2014-09-16 NOTE — Telephone Encounter (Signed)
Message left

## 2014-09-18 DIAGNOSIS — Z79899 Other long term (current) drug therapy: Secondary | ICD-10-CM | POA: Diagnosis not present

## 2014-09-18 DIAGNOSIS — E780 Pure hypercholesterolemia: Secondary | ICD-10-CM | POA: Diagnosis not present

## 2014-09-24 ENCOUNTER — Encounter: Payer: Self-pay | Admitting: Women's Health

## 2014-09-24 ENCOUNTER — Ambulatory Visit (INDEPENDENT_AMBULATORY_CARE_PROVIDER_SITE_OTHER): Payer: Medicare Other | Admitting: Women's Health

## 2014-09-24 ENCOUNTER — Other Ambulatory Visit: Payer: Self-pay | Admitting: Women's Health

## 2014-09-24 ENCOUNTER — Ambulatory Visit (INDEPENDENT_AMBULATORY_CARE_PROVIDER_SITE_OTHER): Payer: Medicare Other

## 2014-09-24 VITALS — BP 110/80

## 2014-09-24 DIAGNOSIS — R1032 Left lower quadrant pain: Secondary | ICD-10-CM | POA: Diagnosis not present

## 2014-09-24 DIAGNOSIS — N8320 Unspecified ovarian cysts: Secondary | ICD-10-CM

## 2014-09-24 DIAGNOSIS — R1909 Other intra-abdominal and pelvic swelling, mass and lump: Secondary | ICD-10-CM

## 2014-09-24 DIAGNOSIS — N838 Other noninflammatory disorders of ovary, fallopian tube and broad ligament: Secondary | ICD-10-CM

## 2014-09-24 DIAGNOSIS — N83202 Unspecified ovarian cyst, left side: Secondary | ICD-10-CM

## 2014-09-24 DIAGNOSIS — N839 Noninflammatory disorder of ovary, fallopian tube and broad ligament, unspecified: Secondary | ICD-10-CM

## 2014-09-24 NOTE — Progress Notes (Signed)
Patient ID: Annette Barker, female   DOB: July 20, 1945, 69 y.o.   MRN: [id number] Presents for follow-up ultrasound and was having left lower quadrant discomfort. Ultrasound 03/2014 was noted to have a 6 mm thick-walled avascular left ovarian cyst CIN-1 25 was 13. Sister. history of ovarian cancer, questionable primary site peritoneal, died age 109. Has been having increased left lower quadrant discomfort especially after walking on hard surfaces/cement.  Exam: Appears well. Ultrasound. Transvaginal anteverted uterus heterogeneous echo pattern. Right ovary atrophic. Normal echo. Left ovary atrophic with thick-walled with cystic lumen 11 x 12 x 14 mm, avascular increased in size from prior scan (had been 44mm). Arterial blood flow seen to left ovary. Negative cul-de-sac. Ultrasound reviewed with Dr. Phineas Real.  Persistent left ovarian cyst with left lower quadrant discomfort.  Plan: Ca 125, schedule appointment with Dr. Phineas Real to discuss possible options, surgery versus watching.

## 2014-09-24 NOTE — Patient Instructions (Addendum)
Ovarian Cyst An ovarian cyst is a fluid-filled sac that forms on an ovary. The ovaries are small organs that produce eggs in women. Various types of cysts can form on the ovaries. Most are not cancerous. Many do not cause problems, and they often go away on their own. Some may cause symptoms and require treatment. Common types of ovarian cysts include:  Functional cysts--These cysts may occur every month during the menstrual cycle. This is normal. The cysts usually go away with the next menstrual cycle if the woman does not get pregnant. Usually, there are no symptoms with a functional cyst.  Endometrioma cysts--These cysts form from the tissue that lines the uterus. They are also called "chocolate cysts" because they become filled with blood that turns brown. This type of cyst can cause pain in the lower abdomen during intercourse and with your menstrual period.  Cystadenoma cysts--This type develops from the cells on the outside of the ovary. These cysts can get very big and cause lower abdomen pain and pain with intercourse. This type of cyst can twist on itself, cut off its blood supply, and cause severe pain. It can also easily rupture and cause a lot of pain.  Dermoid cysts--This type of cyst is sometimes found in both ovaries. These cysts may contain different kinds of body tissue, such as skin, teeth, hair, or cartilage. They usually do not cause symptoms unless they get very big.  Theca lutein cysts--These cysts occur when too much of a certain hormone (human chorionic gonadotropin) is produced and overstimulates the ovaries to produce an egg. This is most common after procedures used to assist with the conception of a baby (in vitro fertilization). CAUSES   Fertility drugs can cause a condition in which multiple large cysts are formed on the ovaries. This is called ovarian hyperstimulation syndrome.  A condition called polycystic ovary syndrome can cause hormonal imbalances that can lead to  nonfunctional ovarian cysts. SIGNS AND SYMPTOMS  Many ovarian cysts do not cause symptoms. If symptoms are present, they may include:  Pelvic pain or pressure.  Pain in the lower abdomen.  Pain during sexual intercourse.  Increasing girth (swelling) of the abdomen.  Abnormal menstrual periods.  Increasing pain with menstrual periods.  Stopping having menstrual periods without being pregnant. DIAGNOSIS  These cysts are commonly found during a routine or annual pelvic exam. Tests may be ordered to find out more about the cyst. These tests may include:  Ultrasound.  X-ray of the pelvis.  CT scan.  MRI.  Blood tests. TREATMENT  Many ovarian cysts go away on their own without treatment. Your health care provider may want to check your cyst regularly for 2-3 months to see if it changes. For women in menopause, it is particularly important to monitor a cyst closely because of the higher rate of ovarian cancer in menopausal women. When treatment is needed, it may include any of the following:  A procedure to drain the cyst (aspiration). This may be done using a long needle and ultrasound. It can also be done through a laparoscopic procedure. This involves using a thin, lighted tube with a tiny camera on the end (laparoscope) inserted through a small incision.  Surgery to remove the whole cyst. This may be done using laparoscopic surgery or an open surgery involving a larger incision in the lower abdomen.  Hormone treatment or birth control pills. These methods are sometimes used to help dissolve a cyst. HOME CARE INSTRUCTIONS   Only take over-the-counter   or prescription medicines as directed by your health care provider.  Follow up with your health care provider as directed.  Get regular pelvic exams and Pap tests. SEEK MEDICAL CARE IF:   Your periods are late, irregular, or painful, or they stop.  Your pelvic pain or abdominal pain does not go away.  Your abdomen becomes  larger or swollen.  You have pressure on your bladder or trouble emptying your bladder completely.  You have pain during sexual intercourse.  You have feelings of fullness, pressure, or discomfort in your stomach.  You lose weight for no apparent reason.  You feel generally ill.  You become constipated.  You lose your appetite.  You develop acne.  You have an increase in body and facial hair.  You are gaining weight, without changing your exercise and eating habits.  You think you are pregnant. SEEK IMMEDIATE MEDICAL CARE IF:   You have increasing abdominal pain.  You feel sick to your stomach (nauseous), and you throw up (vomit).  You develop a fever that comes on suddenly.  You have abdominal pain during a bowel movement.  Your menstrual periods become heavier than usual. MAKE SURE YOU:  Understand these instructions.  Will watch your condition.  Will get help right away if you are not doing well or get worse. Document Released: 05/09/2005 Document Revised: 05/14/2013 Document Reviewed: 01/14/2013 [facility] Patient Information 2015 [place], [place]. This information is not intended to replace advice given to you by your health care provider. Make sure you discuss any questions you have with your health care provider.  Ovarian Cystectomy Ovarian cystectomy is surgery to remove a fluid-filled sac (cyst) on an ovary. The ovaries are small organs that produce eggs in women. Various types of cysts can form on the ovaries. Most are not cancerous. Surgery may be done if a cyst is large or is causing symptoms such as pain. It may also be done for a cyst that is or might be cancerous. This surgery can be done using a laparoscopic technique or an open abdominal technique. The laparoscopic technique involves smaller cuts (incisions) and a faster recovery time. The technique used will depend on your age, the type of cyst, and whether the cyst is cancerous. The laparoscopic technique  is not used for a cancerous cyst. LET [facility] CARE PROVIDER KNOW ABOUT:   Any allergies you have.  All medicines you are taking, including vitamins, herbs, eye drops, creams, and over-the-counter medicines.  Previous problems you or members of your family have had with the use of anesthetics.  Any blood disorders you have.  Previous surgeries you have had.  Medical conditions you have.  Any chance you might be pregnant. RISKS AND COMPLICATIONS Generally, this is a safe procedure. However, as with any procedure, complications can occur. Possible complications include:  Excessive bleeding.  Infection.  Injury to other organs.  Blood clots.  Becoming incapable of getting pregnant (infertile). BEFORE THE PROCEDURE  Ask your health care provider about changing or stopping any regular medicines. Avoid taking aspirin, ibuprofen, or blood thinners as directed by your health care provider.  Do not eat or drink anything after midnight the night before surgery.  If you smoke, do not smoke for at least 2 weeks before your surgery.  Do not drink alcohol the day before your surgery.  Let your health care provider know if you develop a cold or any infection before your surgery.  Arrange for someone to drive you home after the procedure  or after your hospital stay. Also arrange for someone to help you with activities during recovery. PROCEDURE  Either a laparoscopic technique or an open abdominal technique may be used for this surgery.  Small monitors will be put on your body. They are used to check your heart, blood pressure, and oxygen level.   An IV access tube will be put into one of your veins. Medicine will be able to flow directly into your body through this IV tube.   You might be given a medicine to help you relax (sedative).   You will be given a medicine to make you sleep (general anesthetic). A breathing tube may be placed into your lungs during the  procedure. Laparoscopic Technique  Several small cuts (incisions) are made in your abdomen. These are typically about 1 to 2 cm long.   Your abdomen will be filled with carbon dioxide gas so that it expands. This gives the surgeon more room to operate and makes your organs easier to see.   A thin, lighted tube with a tiny camera on the end (laparoscope) is put through one of the small incisions. The camera on the laparoscope sends a picture to a TV screen in the operating room. This gives the surgeon a good view inside your abdomen.   Hollow tubes are put through the other small incisions in your abdomen. The tools needed for the procedure are put through these tubes.  The ovary with the cyst is identified, and the cyst is removed. It is sent to the lab for testing. If it is cancer, both ovaries may need to be removed during a different surgery.  Tools are removed. The incisions are then closed with stitches or skin glue, and dressings may be applied. Open Abdominal Technique  A single large incision is made along your bikini line or in the middle of your lower abdomen.  The ovary with the cyst is identified, and the cyst is removed. It is sent to the lab for testing. If it is cancer, both ovaries may need to be removed during a different surgery.  The incision is then closed with stitches or staples. AFTER THE PROCEDURE   You will wake up from anesthesia and be taken to a recovery area.  If you had laparoscopic surgery, you may be able to go home the same day, or you may need to stay in the hospital overnight.  If you had open abdominal surgery, you will need to stay in the hospital for a few days.  Your IV access tube and catheter will be removed the first or second day, after you are able to eat and drink enough.  You may be given medicine to relieve pain or to help you sleep.  You may be given an antibiotic medicine if needed. Document Released: 03/06/2007 Document Revised:  02/27/2013 Document Reviewed: 12/19/2012 [facility] Patient Information 2015 [place], [place]. This information is not intended to replace advice given to you by your health care provider. Make sure you discuss any questions you have with your health care provider.

## 2014-09-25 LAB — CA 125: CA 125: 13 U/mL (ref <35)

## 2014-09-30 ENCOUNTER — Encounter: Payer: Self-pay | Admitting: Gynecology

## 2014-09-30 ENCOUNTER — Ambulatory Visit (INDEPENDENT_AMBULATORY_CARE_PROVIDER_SITE_OTHER): Payer: Medicare Other | Admitting: Gynecology

## 2014-09-30 VITALS — BP 120/76

## 2014-09-30 DIAGNOSIS — R102 Pelvic and perineal pain: Secondary | ICD-10-CM

## 2014-09-30 DIAGNOSIS — N8320 Unspecified ovarian cysts: Secondary | ICD-10-CM

## 2014-09-30 DIAGNOSIS — N83202 Unspecified ovarian cyst, left side: Secondary | ICD-10-CM

## 2014-09-30 NOTE — Progress Notes (Signed)
Annette Barker 1946-04-07 [id number]        69 y.o.  [id number] Presents with her husband to discuss possible surgery.  She has a history of pelvic pain primarily on the left with evaluation November showing a small 6 mm left ovarian cyst, avascular with CA-125 13. Her pain has persisted on and off with repeat ultrasound now showing a left ovarian cyst 12 mm mean, avascular with CA 120 5 repeat 13. Her history is complicated by an acute flare of IBS which she is having now under the care of a gastroenterologist. The patient's sister has a history of ovarian/primary peritoneal carcinoma and she died at age 47.  Past medical history,surgical history, problem list, medications, allergies, family history and social history were all reviewed and documented in the EPIC chart.  Directed ROS with pertinent positives and negatives documented in the history of present illness/assessment and plan.  Exam: Filed Vitals:   09/30/14 1004  BP: 120/76   General appearance:  Normal   Assessment/Plan:  69 y.o. [id number] who was not examined today due to repeated bouts of diarrhea with her IBS and she was afraid that she would not be able to maintain lying down for an exam and asked not to be examined but would return as needed. I reviewed with her and her husband the overall picture. Certainly at such a small size, avascular with normal CA-125 highly unlikely that this is a cancer. It does appear to have enlarged but again given the small size this may be measurement error and options for expectant management with repeat ultrasound in several months was discussed. She is continuing to have pain and notes that whenever she has her pelvic ultrasound when the probe was placed at her left ovary this reproduces most of her pain. She understands though that surgery would not guarantee relief of her pain in the may be related to other sources such as her irritable bowel. She obviously is concerned with her sister's history and  after an extensive discussion as far as risks/benefits the patient would prefer to proceed with a laparoscopic bilateral salpingo-oophorectomy to remove the left ovarian cyst, hopefully help with her pain and as a risk reductive surgery for ovarian cancer. She understands that this does not affect her peritoneal cancer risk. She will continue to see her gastroenterologist and will move towards scheduling her surgery and she'll represent for a full exam preoperative appointment.   [name] MD, 1:32 PM 09/30/2014

## 2014-09-30 NOTE — Patient Instructions (Signed)
Office will contact you to arrange surgery.

## 2014-10-01 ENCOUNTER — Telehealth: Payer: Self-pay

## 2014-10-01 NOTE — Telephone Encounter (Signed)
I called patient to discuss scheduling Lap BSO.  I scheduled her for 10/29/14 and she will have pre op cons with Dr. Loetta Rough on 10/22/14.  Dr. Loetta Rough requested medical clearance for surgery from her PCP. I called Dr. Carlyle Lipa office and they say patient will need appt and I will need to send records and info to them. I called patient back and left message that she needs to schedule appt with him and once scheduled to call me with date and time and I will attach that info and fax records on to him.

## 2014-10-02 ENCOUNTER — Telehealth: Payer: Self-pay | Admitting: *Deleted

## 2014-10-02 NOTE — Telephone Encounter (Signed)
Pt informed will take OTC as directed.

## 2014-10-02 NOTE — Telephone Encounter (Signed)
There is no reason why she can't take anything she wants for pain. I would recommend ibuprofen 800 mg every 8 hours. We can call in prescription to local pharmacy [id number] if she wants. She can also take up to 4 over-the-counter ibuprofen at one time every 8 hours.

## 2014-10-02 NOTE — Telephone Encounter (Signed)
Pt scheduled for Lap BSO surgery on 10/29/14 c/o left ovarian cyst pain. Pt said on pain level she is at a 8 out of 10, states in the am she is fine but around noon the pain radiates from her pelvic to her back on left side, she is in [place] now, ask if okay to take OTC pain medication as she was told not to take any pain medication. Pt would like recommendations. Please advise

## 2014-10-03 NOTE — Telephone Encounter (Signed)
I called patient again and reminded her she needs to schedule appointment with Dr. Felipa Eth and let me know date/time. I encouraged her to call asap since her surgery is several weeks away.

## 2014-10-07 DIAGNOSIS — N8320 Unspecified ovarian cysts: Secondary | ICD-10-CM | POA: Diagnosis not present

## 2014-10-21 ENCOUNTER — Encounter (HOSPITAL_COMMUNITY)
Admission: RE | Admit: 2014-10-21 | Discharge: 2014-10-21 | Disposition: A | Discharge status: Home / Self Care | Payer: Medicare Other | Source: Ambulatory Visit | Attending: Gynecology | Admitting: Gynecology

## 2014-10-21 ENCOUNTER — Encounter (HOSPITAL_COMMUNITY): Payer: Self-pay

## 2014-10-21 ENCOUNTER — Other Ambulatory Visit: Payer: Self-pay

## 2014-10-21 DIAGNOSIS — Z01818 Encounter for other preprocedural examination: Secondary | ICD-10-CM | POA: Insufficient documentation

## 2014-10-21 HISTORY — DX: Unspecified osteoarthritis, unspecified site: M19.90

## 2014-10-21 HISTORY — DX: Hypothyroidism, unspecified: E03.9

## 2014-10-21 LAB — CBC
HCT: 35.1 % — ABNORMAL LOW (ref 36.0–46.0)
HEMOGLOBIN: 11.9 g/dL — AB (ref 12.0–15.0)
MCH: 33.4 pg (ref 26.0–34.0)
MCHC: 33.9 g/dL (ref 30.0–36.0)
MCV: 98.6 fL (ref 78.0–100.0)
Platelets: 246 10*3/uL (ref 150–400)
RBC: 3.56 MIL/uL — ABNORMAL LOW (ref 3.87–5.11)
RDW: 13.5 % (ref 11.5–15.5)
WBC: 6.4 10*3/uL (ref 4.0–10.5)

## 2014-10-21 LAB — COMPREHENSIVE METABOLIC PANEL
ALT: 19 U/L (ref 14–54)
ANION GAP: 5 (ref 5–15)
AST: 21 U/L (ref 15–41)
Albumin: 4.3 g/dL (ref 3.5–5.0)
Alkaline Phosphatase: 63 U/L (ref 38–126)
BILIRUBIN TOTAL: 0.6 mg/dL (ref 0.3–1.2)
BUN: 11 mg/dL (ref 6–20)
CALCIUM: 9.2 mg/dL (ref 8.9–10.3)
CHLORIDE: 106 mmol/L (ref 101–111)
CO2: 27 mmol/L (ref 22–32)
CREATININE: 0.86 mg/dL (ref 0.44–1.00)
GFR calc Af Amer: >60 mL/min (ref >60)
Glucose, Bld: 98 mg/dL (ref 65–99)
Potassium: 4.1 mmol/L (ref 3.5–5.1)
Sodium: 138 mmol/L (ref 135–145)
Total Protein: 7.7 g/dL (ref 6.5–8.1)

## 2014-10-21 NOTE — Pre-Procedure Instructions (Signed)
EKG was abnormal today. Showed Dr. Tresa Moore and and he ok'd her for surgery.

## 2014-10-21 NOTE — Patient Instructions (Addendum)
Your procedure is scheduled on: October 29, 2014  Enter through the Main Entrance of [facility] at: 11:30 am   Pick up the phone at the desk and dial [phone].  Call this number if you have problems the morning of surgery: [phone].  Remember: Do NOT eat food: after midnight on Tuesday  Do NOT drink clear liquids after: 0900 am day of surgery  Take these medicines the morning of surgery with a SIP OF WATER:  Synthroid   Do NOT wear jewelry (body piercing), metal hair clips/bobby pins, make-up, or nail polish. Do NOT wear lotions, powders, or perfumes.  You may wear deoderant. Do NOT shave for 48 hours prior to surgery. Do NOT bring valuables to the hospital. Contacts, dentures, or bridgework may not be worn into surgery. Have a responsible adult drive you home and stay with you for 24 hours after your procedure

## 2014-10-22 ENCOUNTER — Ambulatory Visit (INDEPENDENT_AMBULATORY_CARE_PROVIDER_SITE_OTHER): Payer: Medicare Other | Admitting: Gynecology

## 2014-10-22 ENCOUNTER — Encounter: Payer: Self-pay | Admitting: Gynecology

## 2014-10-22 VITALS — BP 120/76

## 2014-10-22 DIAGNOSIS — N8320 Unspecified ovarian cysts: Secondary | ICD-10-CM | POA: Diagnosis not present

## 2014-10-22 DIAGNOSIS — R102 Pelvic and perineal pain: Secondary | ICD-10-CM | POA: Diagnosis not present

## 2014-10-22 DIAGNOSIS — N83202 Unspecified ovarian cyst, left side: Secondary | ICD-10-CM

## 2014-10-22 NOTE — Patient Instructions (Signed)
Followup for surgery as scheduled. 

## 2014-10-22 NOTE — Progress Notes (Signed)
Annette Barker June 30, 1945 [id number]   Preoperative consult  Chief complaint: pelvic pain, left ovarian cyst, family history of ovarian/primary peritoneal carcinoma  History of present illness: 69 y.o. [id number] with history of left lower quadrant/pelvic pain since November 2015. Ultrasound showed a 6 mm thick-walled avascular cyst and a CA125 of 13. Patient planned expectant management and was reevaluated with persistence of her left-sided pain with repeat ultrasound showing a 12 mm avascular left ovarian cyst and a repeat CA125 of 13. The patient's sister died at age 15 of ovarian/peritoneal carcinoma.  Her pain is being reproduced when her left ovary is probed with the ultrasound vaginal probe. The patient is very concerned about the possibility of cancer and the persistence of her pain and wants to proceed with laparoscopic removal is admitted for laparoscopic bilateral salpingo-oophorectomy as discussed below.  Past medical history,surgical history, medications, allergies, family history and social history were all reviewed and documented in the EPIC chart.  ROS:  Was performed and pertinent positives and negatives are included in the history of present illness.  Exam:  Annette Barker assistant General: well developed, well nourished female, no acute distress HEENT: normal  Lungs: clear to auscultation without wheezing, rales or rhonchi  Cardiac: regular rate without rubs, murmurs or gallops  Abdomen: soft, nontender without masses, guarding, rebound, organomegaly  Pelvic: external bus vagina: normal   Cervix: grossly normal  Uterus: normal size, midline and mobile, nontender  Adnexa: without masses or tenderness  Rectovaginal exam within normal limits    Assessment/Plan:  69 y.o. [id number] with history as above. I reviewed with her and her husband that is unlikely that this is a malignant process but given her family history and the slight increase in size of the cyst cannot guarantee otherwise.  CA-125 is remained normal. The patient's pictures also complicated by her history of IBS and she understands that this pain may be IBS related and that there are no guarantees that this surgery will relieve her pain.  She does relate that this does not feel like her classic IBS pain. I also reviewed with her that removing both her ovaries and fallopian tubes will not guarantee that she does not develop a primary peritoneal carcinoma as there was some question as to this being the diagnosis with her sister. I also  Discussed with her that I cannot guarantee that I will be able to remove both ovaries and fallopian tubes if adhesive disease is encountered and I felt removing that side would significantly increased risk of the surgery requiring a larger incision that we would have to make that decision later date. I reviewed the proposed intraoperative and postoperative courses as well as the recovery period. Multiple port sites, insufflation, use of sharp blunt dissection, electrocautery and harmonic scalpel were all reviewed. The risks to include infection, prolonged antibiotics, hemorrhage necessitating transfusion and the risks of transfusion including transfusion reaction, hepatitis, HIV, mad cow disease and other unknown entities. Incisional complications requiring opening and draining of incisions and closure by secondary intention, long-term issues such as keloid/cosmetics and hernia formation were also discussed. The risks of inadvertent injury to internal organs including bowel, bladder, ureters, vessels, nerves necessitating major exploratory reparative surgeries and future reparative surgeries either immediately recognized or delay recognized including ostomy formation, bowel resection, bladder and ureteral damage repair surgeries all discussed understood and accepted. The patient's questions were answered to her satisfaction and she is ready to proceed with surgery.    [name] MD, 4:35 PM  10/22/2014

## 2014-10-22 NOTE — H&P (Signed)
Annette Barker [birth year] [id number]   History and Physical  Chief complaint: pelvic pain, left ovarian cyst, family history of ovarian/primary peritoneal carcinoma  History of present illness: 69 y.o. [id number] with history of left lower quadrant/pelvic pain since November 2015. Ultrasound showed a 6 mm thick-walled avascular cyst and a CA125 of 13. Patient planned expectant management and was reevaluated with persistence of her left-sided pain with repeat ultrasound showing a 12 mm avascular left ovarian cyst and a repeat CA125 of 13. The patient's sister died at age 80 of ovarian/peritoneal carcinoma.  Her pain is being reproduced when her left ovary is probed with the ultrasound vaginal probe. The patient is very concerned about the possibility of cancer and the persistence of her pain and wants to proceed with laparoscopic removal is admitted for laparoscopic bilateral salpingo-oophorectomy as discussed below.  Past medical history,surgical history, medications, allergies, family history and social history were all reviewed and documented in the EPIC chart.  ROS:  Was performed and pertinent positives and negatives are included in the history of present illness.  Exam:  Kim assistant General: well developed, well nourished female, no acute distress HEENT: normal  Lungs: clear to auscultation without wheezing, rales or rhonchi  Cardiac: regular rate without rubs, murmurs or gallops  Abdomen: soft, nontender without masses, guarding, rebound, organomegaly  Pelvic: external bus vagina: normal   Cervix: grossly normal  Uterus: normal size, midline and mobile, nontender  Adnexa: without masses or tenderness  Rectovaginal exam within normal limits    Assessment/Plan:  69 y.o. [id number] with history as above. I reviewed with her and her husband that is unlikely that this is a malignant process but given her family history and the slight increase in size of the cyst cannot guarantee otherwise.  CA-125 is remained normal. The patient's pictures also complicated by her history of IBS and she understands that this pain may be IBS related and that there are no guarantees that this surgery will relieve her pain.  She does relate that this does not feel like her classic IBS pain. I also reviewed with her that removing both her ovaries and fallopian tubes will not guarantee that she does not develop a primary peritoneal carcinoma as there was some question as to this being the diagnosis with her sister. I also  Discussed with her that I cannot guarantee that I will be able to remove both ovaries and fallopian tubes if adhesive disease is encountered and I felt removing that side would significantly increased risk of the surgery requiring a larger incision that we would have to make that decision later date. I reviewed the proposed intraoperative and postoperative courses as well as the recovery period. Multiple port sites, insufflation, use of sharp blunt dissection, electrocautery and harmonic scalpel were all reviewed. The risks to include infection, prolonged antibiotics, hemorrhage necessitating transfusion and the risks of transfusion including transfusion reaction, hepatitis, HIV, mad cow disease and other unknown entities. Incisional complications requiring opening and draining of incisions and closure by secondary intention, long-term issues such as keloid/cosmetics and hernia formation were also discussed. The risks of inadvertent injury to internal organs including bowel, bladder, ureters, vessels, nerves necessitating major exploratory reparative surgeries and future reparative surgeries either immediately recognized or delay recognized including ostomy formation, bowel resection, bladder and ureteral damage repair surgeries all discussed understood and accepted. The patient's questions were answered to her satisfaction and she is ready to proceed with surgery.    [name] MD, 4:48 PM  10/22/2014

## 2014-10-27 ENCOUNTER — Telehealth: Payer: Self-pay

## 2014-10-27 NOTE — Telephone Encounter (Signed)
Patient asked if you could give her Rx for motion sickness patch that goes behind ear.  She discussed with nurse at [facility] that she had terrible nausea with anesthesia and she suggested patient see if you would prescribe this for her.

## 2014-10-28 ENCOUNTER — Other Ambulatory Visit: Payer: Self-pay | Admitting: Gynecology

## 2014-10-28 MED ORDER — DEXTROSE 5 % IV SOLN
2.0000 g | INTRAVENOUS | Status: AC
  Administered 2014-10-29: 2 g via INTRAVENOUS
  Filled 2014-10-28: qty 2

## 2014-10-28 MED ORDER — SCOPOLAMINE 1 MG/3DAYS TD PT72: 1.0000 | MEDICATED_PATCH | TRANSDERMAL | Status: DC

## 2014-10-28 NOTE — Telephone Encounter (Addendum)
Patient informed. Rx sent 

## 2014-10-28 NOTE — Telephone Encounter (Signed)
Okay to prescribe the scopolamine patch one kit as directed 

## 2014-10-29 ENCOUNTER — Ambulatory Visit (HOSPITAL_COMMUNITY): Payer: Medicare Other | Admitting: Anesthesiology

## 2014-10-29 ENCOUNTER — Ambulatory Visit (HOSPITAL_COMMUNITY)
Admission: RE | Admit: 2014-10-29 | Discharge: 2014-10-29 | Disposition: A | Discharge status: Home / Self Care | Payer: Medicare Other | Source: Ambulatory Visit | Attending: Gynecology | Admitting: Gynecology

## 2014-10-29 ENCOUNTER — Encounter (HOSPITAL_COMMUNITY): Payer: Self-pay | Admitting: Anesthesiology

## 2014-10-29 ENCOUNTER — Encounter (HOSPITAL_COMMUNITY)
Admission: RE | Disposition: A | Discharge status: Home / Self Care | Payer: Self-pay | Source: Ambulatory Visit | Attending: Gynecology

## 2014-10-29 DIAGNOSIS — K660 Peritoneal adhesions (postprocedural) (postinfection): Secondary | ICD-10-CM | POA: Insufficient documentation

## 2014-10-29 DIAGNOSIS — N736 Female pelvic peritoneal adhesions (postinfective): Secondary | ICD-10-CM | POA: Diagnosis not present

## 2014-10-29 DIAGNOSIS — M810 Age-related osteoporosis without current pathological fracture: Secondary | ICD-10-CM | POA: Diagnosis not present

## 2014-10-29 DIAGNOSIS — K589 Irritable bowel syndrome without diarrhea: Secondary | ICD-10-CM | POA: Diagnosis not present

## 2014-10-29 DIAGNOSIS — Z87891 Personal history of nicotine dependence: Secondary | ICD-10-CM | POA: Diagnosis not present

## 2014-10-29 DIAGNOSIS — Z888 Allergy status to other drugs, medicaments and biological substances status: Secondary | ICD-10-CM | POA: Diagnosis not present

## 2014-10-29 DIAGNOSIS — M1990 Unspecified osteoarthritis, unspecified site: Secondary | ICD-10-CM | POA: Diagnosis not present

## 2014-10-29 DIAGNOSIS — N8329 Other ovarian cysts: Secondary | ICD-10-CM | POA: Diagnosis not present

## 2014-10-29 DIAGNOSIS — Z8041 Family history of malignant neoplasm of ovary: Secondary | ICD-10-CM | POA: Diagnosis not present

## 2014-10-29 DIAGNOSIS — N8320 Unspecified ovarian cysts: Secondary | ICD-10-CM | POA: Insufficient documentation

## 2014-10-29 DIAGNOSIS — R102 Pelvic and perineal pain: Secondary | ICD-10-CM | POA: Diagnosis not present

## 2014-10-29 DIAGNOSIS — E039 Hypothyroidism, unspecified: Secondary | ICD-10-CM | POA: Insufficient documentation

## 2014-10-29 DIAGNOSIS — E780 Pure hypercholesterolemia: Secondary | ICD-10-CM | POA: Diagnosis not present

## 2014-10-29 DIAGNOSIS — Z9851 Tubal ligation status: Secondary | ICD-10-CM | POA: Insufficient documentation

## 2014-10-29 DIAGNOSIS — N838 Other noninflammatory disorders of ovary, fallopian tube and broad ligament: Secondary | ICD-10-CM | POA: Diagnosis not present

## 2014-10-29 HISTORY — PX: LAPAROSCOPIC SALPINGO OOPHERECTOMY: SHX5927

## 2014-10-29 SURGERY — SALPINGO-OOPHORECTOMY, LAPAROSCOPIC
Anesthesia: General | Site: Abdomen | Laterality: Bilateral

## 2014-10-29 MED ORDER — PROPOFOL 10 MG/ML IV BOLUS
INTRAVENOUS | Status: DC | PRN
  Administered 2014-10-29: 150 mg via INTRAVENOUS

## 2014-10-29 MED ORDER — NEOSTIGMINE METHYLSULFATE 10 MG/10ML IV SOLN
INTRAVENOUS | Status: DC | PRN
  Administered 2014-10-29: 3 mg via INTRAVENOUS

## 2014-10-29 MED ORDER — ONDANSETRON HCL 4 MG/2ML IJ SOLN
INTRAMUSCULAR | Status: AC
  Filled 2014-10-29: qty 2

## 2014-10-29 MED ORDER — HYDROCODONE-ACETAMINOPHEN 2.5-500 MG PO TABS: 1.0000 | ORAL_TABLET | Freq: Four times a day (QID) | ORAL | Status: DC | PRN

## 2014-10-29 MED ORDER — METOCLOPRAMIDE HCL 5 MG/ML IJ SOLN
10.0000 mg | Freq: Once | INTRAMUSCULAR | Status: AC | PRN
  Administered 2014-10-29: 10 mg via INTRAVENOUS

## 2014-10-29 MED ORDER — PROPOFOL 10 MG/ML IV BOLUS
INTRAVENOUS | Status: AC
  Filled 2014-10-29: qty 20

## 2014-10-29 MED ORDER — FENTANYL CITRATE (PF) 100 MCG/2ML IJ SOLN: 25.0000 ug | INTRAMUSCULAR | Status: DC | PRN

## 2014-10-29 MED ORDER — LIDOCAINE HCL (PF) 1 % IJ SOLN
INTRAMUSCULAR | Status: AC
  Filled 2014-10-29: qty 5

## 2014-10-29 MED ORDER — GLYCOPYRROLATE 0.2 MG/ML IJ SOLN
INTRAMUSCULAR | Status: AC
  Filled 2014-10-29: qty 3

## 2014-10-29 MED ORDER — MEPERIDINE HCL 25 MG/ML IJ SOLN: 6.2500 mg | INTRAMUSCULAR | Status: DC | PRN

## 2014-10-29 MED ORDER — HEPARIN SODIUM (PORCINE) 5000 UNIT/ML IJ SOLN
INTRAMUSCULAR | Status: AC
  Filled 2014-10-29: qty 1

## 2014-10-29 MED ORDER — HEPARIN SODIUM (PORCINE) 5000 UNIT/ML IJ SOLN
INTRAMUSCULAR | Status: DC | PRN
  Administered 2014-10-29: 5000 [IU]

## 2014-10-29 MED ORDER — DEXAMETHASONE SODIUM PHOSPHATE 10 MG/ML IJ SOLN
INTRAMUSCULAR | Status: DC | PRN
  Administered 2014-10-29: 4 mg via INTRAVENOUS

## 2014-10-29 MED ORDER — BUPIVACAINE HCL (PF) 0.25 % IJ SOLN
INTRAMUSCULAR | Status: AC
  Filled 2014-10-29: qty 30

## 2014-10-29 MED ORDER — ROCURONIUM BROMIDE 100 MG/10ML IV SOLN
INTRAVENOUS | Status: DC | PRN
  Administered 2014-10-29: 30 mg via INTRAVENOUS

## 2014-10-29 MED ORDER — DEXAMETHASONE SODIUM PHOSPHATE 4 MG/ML IJ SOLN
INTRAMUSCULAR | Status: AC
  Filled 2014-10-29: qty 1

## 2014-10-29 MED ORDER — MIDAZOLAM HCL 2 MG/2ML IJ SOLN
INTRAMUSCULAR | Status: AC
  Filled 2014-10-29: qty 2

## 2014-10-29 MED ORDER — LIDOCAINE HCL (CARDIAC) 20 MG/ML IV SOLN
INTRAVENOUS | Status: DC | PRN
  Administered 2014-10-29: 30 mg via INTRAVENOUS

## 2014-10-29 MED ORDER — LACTATED RINGERS IV SOLN
INTRAVENOUS | Status: DC
  Administered 2014-10-29 (×2): via INTRAVENOUS

## 2014-10-29 MED ORDER — HYDROCODONE-ACETAMINOPHEN 7.5-325 MG PO TABS: 1.0000 | ORAL_TABLET | Freq: Once | ORAL | Status: DC | PRN

## 2014-10-29 MED ORDER — KETOROLAC TROMETHAMINE 30 MG/ML IJ SOLN
INTRAMUSCULAR | Status: DC | PRN
  Administered 2014-10-29: 30 mg via INTRAVENOUS

## 2014-10-29 MED ORDER — METOCLOPRAMIDE HCL 5 MG/ML IJ SOLN
INTRAMUSCULAR | Status: AC
  Filled 2014-10-29: qty 2

## 2014-10-29 MED ORDER — FENTANYL CITRATE (PF) 100 MCG/2ML IJ SOLN
INTRAMUSCULAR | Status: DC | PRN
  Administered 2014-10-29 (×2): 50 ug via INTRAVENOUS
  Administered 2014-10-29: 100 ug via INTRAVENOUS

## 2014-10-29 MED ORDER — NEOSTIGMINE METHYLSULFATE 10 MG/10ML IV SOLN
INTRAVENOUS | Status: AC
  Filled 2014-10-29: qty 1

## 2014-10-29 MED ORDER — ONDANSETRON HCL 4 MG/2ML IJ SOLN
INTRAMUSCULAR | Status: DC | PRN
  Administered 2014-10-29: 4 mg via INTRAVENOUS

## 2014-10-29 MED ORDER — BUPIVACAINE HCL (PF) 0.25 % IJ SOLN
INTRAMUSCULAR | Status: DC | PRN
  Administered 2014-10-29: 10 mL

## 2014-10-29 MED ORDER — KETOROLAC TROMETHAMINE 30 MG/ML IJ SOLN
INTRAMUSCULAR | Status: AC
  Filled 2014-10-29: qty 1

## 2014-10-29 MED ORDER — FENTANYL CITRATE (PF) 250 MCG/5ML IJ SOLN
INTRAMUSCULAR | Status: AC
  Filled 2014-10-29: qty 5

## 2014-10-29 MED ORDER — MIDAZOLAM HCL 2 MG/2ML IJ SOLN
INTRAMUSCULAR | Status: DC | PRN
  Administered 2014-10-29: 2 mg via INTRAVENOUS

## 2014-10-29 MED ORDER — SCOPOLAMINE 1 MG/3DAYS TD PT72
1.0000 | MEDICATED_PATCH | Freq: Once | TRANSDERMAL | Status: DC
Start: 2014-10-29 — End: 2014-10-29

## 2014-10-29 MED ORDER — EPHEDRINE SULFATE 50 MG/ML IJ SOLN
INTRAMUSCULAR | Status: DC | PRN
  Administered 2014-10-29 (×2): 10 mg via INTRAVENOUS

## 2014-10-29 MED ORDER — LACTATED RINGERS IR SOLN
Status: DC | PRN
  Administered 2014-10-29: 3000 mL

## 2014-10-29 MED ORDER — GLYCOPYRROLATE 0.2 MG/ML IJ SOLN
INTRAMUSCULAR | Status: DC | PRN
  Administered 2014-10-29: 0.4 mg via INTRAVENOUS

## 2014-10-29 SURGICAL SUPPLY — 24 items
BAG SPEC RTRVL LRG 6X4 10 (ENDOMECHANICALS)
BARRIER ADHS 3X4 INTERCEED (GAUZE/BANDAGES/DRESSINGS) IMPLANT
BLADE SURG 15 STRL LF C SS BP (BLADE) ×1 IMPLANT
BLADE SURG 15 STRL SS (BLADE) ×2
BRR ADH 4X3 ABS CNTRL BYND (GAUZE/BANDAGES/DRESSINGS)
CABLE HIGH FREQUENCY MONO STRZ (ELECTRODE) IMPLANT
CATH ROBINSON RED A/P 16FR (CATHETERS) ×2 IMPLANT
CLOTH BEACON ORANGE TIMEOUT ST (SAFETY) ×2 IMPLANT
GLOVE BIO SURGEON STRL SZ7.5 (GLOVE) ×2 IMPLANT
GOWN STRL REUS W/TWL LRG LVL3 (GOWN DISPOSABLE) ×4 IMPLANT
LIQUID BAND (GAUZE/BANDAGES/DRESSINGS) ×1 IMPLANT
NS IRRIG 1000ML POUR BTL (IV SOLUTION) ×2 IMPLANT
PACK LAPAROSCOPY BASIN (CUSTOM PROCEDURE TRAY) ×2 IMPLANT
POUCH SPECIMEN RETRIEVAL 10MM (ENDOMECHANICALS) IMPLANT
PROTECTOR NERVE ULNAR (MISCELLANEOUS) ×2 IMPLANT
SET IRRIG TUBING LAPAROSCOPIC (IRRIGATION / IRRIGATOR) IMPLANT
SHEARS HARMONIC ACE PLUS 36CM (ENDOMECHANICALS) IMPLANT
SUT PLAIN 4 0 FS 2 27 (SUTURE) ×2 IMPLANT
SUT VICRYL 0 UR6 27IN ABS (SUTURE) ×2 IMPLANT
TOWEL OR 17X24 6PK STRL BLUE (TOWEL DISPOSABLE) ×4 IMPLANT
TROCAR XCEL NON-BLD 11X100MML (ENDOMECHANICALS) ×2 IMPLANT
TROCAR XCEL NON-BLD 5MMX100MML (ENDOMECHANICALS) ×2 IMPLANT
WARMER LAPAROSCOPE (MISCELLANEOUS) ×2 IMPLANT
WATER STERILE IRR 1000ML POUR (IV SOLUTION) ×2 IMPLANT

## 2014-10-29 NOTE — Discharge Instructions (Signed)
Postoperative Instructions Laparoscopy ° °Dr. Adryana Mogensen and the nursing staff have discussed postoperative instructions with you.  If you have any questions please ask them before you leave the hospital, or call Dr Stephan Nelis’s office at [phone].   ° °We would like to emphasize the following instructions: ° ° °? Call the office to make your follow-up appointment as recommended by Dr Petros Ahart (usually 1-2 weeks). ° °? You were given a prescription, or one was ordered for you at the pharmacy you designated.  Get that prescription filled and take the medication according to instructions. ° °? You may eat a regular diet, but slowly until you start having bowel movements. ° °? Drink plenty of water daily. ° °? Nothing in the vagina (intercourse, douching, objects of any kind) for 2 weeks.  When reinitiating intercourse, if it is uncomfortable, stop and make an appointment with Dr Audyn Dimercurio to be evaluated. ° °? No driving for several days until the anesthesia has worn off and you are not having significant pain.  Car rides (short) are ok, as long as you are not having significant pain, but no traveling out of town until your postoperative appointment. ° °? You may shower, but no baths for two weeks.  Walking up and down stairs is ok.  No heavy lifting, prolonged standing, repeated bending or any “working out” until your first  postoperative appointment. ° °? Rest frequently, listen to your body and do not push yourself and overdo it. ° °? Call if: ° °o Your pain medication does not seem strong enough. °o Worsening pain or abdominal bloating °o Persistent nausea or vomiting °o Difficulty with urination or bowel movements. °o Temperature of 101 degrees or higher. °o Heavy vaginal bleeding.  If your period is due, you may use tampons.   °o Incisions become red, tender or begin to drain. °o You have any questions or concerns °

## 2014-10-29 NOTE — Transfer of Care (Signed)
Immediate Anesthesia Transfer of Care Note  Patient: Annette Barker  Procedure(s) Performed: Procedure(s) with comments: LAPAROSCOPIC SALPINGO OOPHORECTOMY (Bilateral) - 1:00pm OR time requested  1 1/2 hours OR time requested.  Patient Location: PACU  Anesthesia Type:General  Level of Consciousness: awake, alert  and sedated  Airway & Oxygen Therapy: Patient Spontanous Breathing and Patient connected to nasal cannula oxygen  Post-op Assessment: Report given to RN and Post -op Vital signs reviewed and stable  Post vital signs: Reviewed and stable  Last Vitals:  Filed Vitals:   10/29/14 1230  BP: 138/86  Pulse:   Temp:   Resp:     Complications: No apparent anesthesia complications

## 2014-10-29 NOTE — Op Note (Signed)
Annette Barker 1945-08-15 [id number]   Post Operative Note   Date of surgery:  10/29/2014  Pre Op Dx:  Left-sided abdominopelvic pain, persistent left ovarian cyst  Post Op Dx:  Left-sided abdominopelvic pain, persistent left ovarian cyst, left sided intestinal adhesions  Procedure:  Laparoscopic bilateral salpingo-oophorectomy, lysis of adhesions  Surgeon:  [name]  Assistant:  Uvaldo Rising  Anesthesia:  General  EBL:  minimal  Complications:  None  Specimen:  #1 opening cell washing #2 left ovary and fallopian tube segment #3 right ovary and fallopian tube segment to pathology  Findings: EUA:  External BUS vagina with atrophic changes. Cervix grossly normal. Uterus small midline mobile. Adnexa without masses   Operative:  Anterior cul-de-sac normal. Posterior cul-de-sac normal. Uterus small midline mobile. Right and left fallopian tubes grossly normal with absent distal fimbriated ends consistent with history of tubal sterilization. Right and left ovaries postmenopausal in appearance grossly normal. Left colonic to left adnexa/lateral sidewall adhesions. Right fallopian tube to pelvic sidewall adhesions. Upper abdominal exam shows liver smooth no abnormalities. Gallbladder not visualized. Appendix grossly normal free and mobile. No upper abdominal adhesions or other pathology.  Procedure:  The patient was taken to the operating room, placed in the low dorsal lithotomy position, underwent general anesthesia, received a perineal/vaginal preparation with Betadine solution and the bladder emptied with an in and out Foley catheterization. The patient received an abdominal preparation with DuraPrep. The timeout was performed by the surgical team. The cervix visualized with the speculum and a Cohen cannula and tenaculum were placed without difficulty. The patient was draped in the usual fashion. A vertical infraumbilical incision was made and using the direct entry 10 mm trocar the  abdomen was directly entered without difficulty and subsequently insufflated. Right and left 5 mm suprapubic ports were placed after transillumination for the vessels under direct visualization without difficulty. Examination of the pelvic organs and upper abdominal exam was carried out with findings noted above. An opening cell washing was then performed and sent to cytology. The left colonic to sidewall/adnexal adhesions were then lysed using the harmonic scalpel to allow visualization of the left infundibulopelvic ligament. The left ureter was visualized away from the site and the infundibulopelvic ligament was transected using the harmonic scalpel without difficulty. The left fallopian tube segment and ovary were then freed from its attachments using the harmonic scalpel transecting the peritoneal leaf of the broad ligament and ultimately the uterine ovarian ligament. The specimen was then placed in the anterior cul-de-sac for future retrieval. The right fallopian tube and ovary were elevated, the filmy adhesions from the fallopian tube to sidewall were lysed using the harmonic scalpel and the right infundibulopelvic ligament identified, the right ureter visualized away from the surgical site and the right adnexa was excised in a similar procedure as the left. Using the operative laparoscope both specimens were retrieved through the 10 mm infraumbilical port and sent to pathology separately. The pelvis was copiously irrigated and all surgical sites were reinspected showing adequate hemostasis and a low pressure situation. The suprapubic ports were then removed, the gas allowed to escape and the infraumbilical port backed out under direct visualization showing adequate hemostasis and no evidence of hernia formation. All skin incisions were injected using 0.25% Marcaine and the infraumbilical port was closed using 0 Vicryl suture in an interrupted subcutaneous/fascial stitch. All skin incisions were closed using  LiquiBand skin adhesive, the patient received intraoperative Toradol, the Cohen cannula and tenaculum were removed and the patient was  awakened without difficulty and taken to the recovery room in good condition having tolerated procedure well.     [name] MD, 2:26 PM 10/29/2014

## 2014-10-29 NOTE — Anesthesia Procedure Notes (Signed)
Procedure Name: Intubation Date/Time: 10/29/2014 1:05 PM Performed by: Bufford Spikes Pre-anesthesia Checklist: Patient identified, Timeout performed, Emergency Drugs available, Suction available and Patient being monitored Patient Re-evaluated:Patient Re-evaluated prior to inductionOxygen Delivery Method: Circle system utilized Preoxygenation: Pre-oxygenation with 100% oxygen Intubation Type: IV induction Ventilation: Mask ventilation without difficulty Laryngoscope Size: Miller and 2 Grade View: Grade I Tube type: Oral Tube size: 7.0 mm Number of attempts: 1 Airway Equipment and Method: Stylet Placement Confirmation: ETT inserted through vocal cords under direct vision,  positive ETCO2,  CO2 detector and breath sounds checked- equal and bilateral Secured at: 20 cm Tube secured with: Tape Dental Injury: Teeth and Oropharynx as per pre-operative assessment

## 2014-10-29 NOTE — H&P (Signed)
  The patient was examined.  I reviewed the proposed surgery and consent form with the patient.  The dictated history and physical is current and accurate and all questions were answered. The patient is ready to proceed with surgery and has a realistic understanding and expectation for the outcome.   [name] MD, 12:28 PM 10/29/2014

## 2014-10-29 NOTE — Anesthesia Preprocedure Evaluation (Addendum)
Anesthesia Evaluation  Patient identified by MRN, date of birth, ID band Patient awake    Reviewed: Allergy & Precautions, Patient's Chart, lab work & pertinent test results  History of Anesthesia Complications (+) Family history of anesthesia reaction  Airway Mallampati: II  TM Distance: >3 FB Neck ROM: Full    Dental no notable dental hx. (+) Teeth Intact   Pulmonary former smoker,  breath sounds clear to auscultation  Pulmonary exam normal       Cardiovascular negative cardio ROS Normal cardiovascular examRhythm:Regular Rate:Normal     Neuro/Psych negative neurological ROS  negative psych ROS   GI/Hepatic Neg liver ROS, IBS   Endo/Other  Hypothyroidism Hyperlipidemia  Renal/GU negative Renal ROS  negative genitourinary   Musculoskeletal  (+) Arthritis -,   Abdominal   Peds  Hematology   Anesthesia Other Findings   Reproductive/Obstetrics Left ovarian cyst Pelvic pain Family Hx/o Ovarian Ca                            Anesthesia Physical Anesthesia Plan  ASA: II  Anesthesia Plan: General   Post-op Pain Management:    Induction: Intravenous  Airway Management Planned: Oral ETT  Additional Equipment:   Intra-op Plan:   Post-operative Plan: Extubation in OR  Informed Consent: I have reviewed the patients History and Physical, chart, labs and discussed the procedure including the risks, benefits and alternatives for the proposed anesthesia with the patient or authorized representative who has indicated his/her understanding and acceptance.   Dental advisory given  Plan Discussed with: Anesthesiologist, CRNA and Surgeon  Anesthesia Plan Comments:         Anesthesia Quick Evaluation

## 2014-10-29 NOTE — Anesthesia Postprocedure Evaluation (Signed)
Anesthesia Post Note  Patient: Annette Barker  Procedure(s) Performed: Procedure(s) (LRB): LAPAROSCOPIC SALPINGO OOPHORECTOMY (Bilateral)  Anesthesia type: General  Patient location: PACU  Post pain: Pain level controlled  Post assessment: Post-op Vital signs reviewed  Last Vitals:  Filed Vitals:   10/29/14 1445  BP: 111/82  Pulse: 67  Temp:   Resp: 13    Post vital signs: Reviewed  Level of consciousness: sedated  Complications: No apparent anesthesia complications

## 2014-10-30 ENCOUNTER — Telehealth: Payer: Self-pay | Admitting: Obstetrics and Gynecology

## 2014-10-30 ENCOUNTER — Telehealth: Payer: Self-pay | Admitting: *Deleted

## 2014-10-30 ENCOUNTER — Encounter (HOSPITAL_COMMUNITY): Payer: Self-pay | Admitting: Gynecology

## 2014-10-30 MED ORDER — HYDROCODONE-ACETAMINOPHEN 5-300 MG PO TABS: 1.0000 | ORAL_TABLET | Freq: Four times a day (QID) | ORAL | Status: DC | PRN

## 2014-10-30 NOTE — Telephone Encounter (Signed)
Pt had Lap BSO  on 10/29/14  doing great a little sore but feels fine,is planning to take a trip to [place] next Thursday 11/06/14-11/10/14 which about 4 1/2 hour drive, husband will be driving. She asked if you think this will be fine? Any recommendations? Pt post op is scheduled 11/12/14. Please advise

## 2014-10-30 NOTE — Telephone Encounter (Signed)
Called patient at [phone] and LMOVM for patient to call me.  Patient needs to come to office to pick up hard copy Rx for Vicodin to take to the pharmacy to take to [facility].

## 2014-10-30 NOTE — Telephone Encounter (Signed)
Okay to go but I would recommend stopping several times on the way just to get out of the car and walk around

## 2014-10-30 NOTE — Telephone Encounter (Signed)
Late entry.  Call after hours last hs from patient stating that pharmacy does not have the dosage of Vicodin Dr. Phineas Real ordered for her post op.  Patient asking for limited Rx.  Only wants 10 tabs. Had laparoscopic BSO/LOA yesterday.   Emergency Rx to [facility] by phone - [phone]. Vicodin 5/325 mg Sig - one po q 6 hours prn pain.  Dispense - 10  RF none.   Will need hard copy to go to pharmacy.   Will contact patient today to have her pick up the Rx. It may not get to pharmacy in 72 hours otherwise.

## 2014-10-30 NOTE — Telephone Encounter (Signed)
Thank you. Encounter closed. 

## 2014-10-30 NOTE — Telephone Encounter (Signed)
Left detailed message on pt voicemail.

## 2014-10-30 NOTE — Telephone Encounter (Signed)
Spoke with patient and advised someone would have to pick up hard copy Rx and take to pharmacy.  She states she will have her husband come by today and pick up Rx.  She states to thank Dr. Quincy Simmonds for calling in Rx for her last night.  Rx. At front desk and husband to sign for it.  Routing to Dr. Quincy Simmonds.

## 2014-10-31 ENCOUNTER — Telehealth: Payer: Self-pay

## 2014-10-31 NOTE — Telephone Encounter (Signed)
Patient informed. 

## 2014-10-31 NOTE — Telephone Encounter (Signed)
I would rest through the weekend and push the fluids. Ambulate as she feels comfortable. Remove the patch make sure she washes her hands.

## 2014-10-31 NOTE — Telephone Encounter (Addendum)
Patient informed.  She had a few more questions  #1 Pain med makes her dizzy and afraid to move around so she is only taking it hs and using Tylenol in the daytime. She asked if okay to use the heating pad to her abdomen if it hurts in the day?  #2 She does not remember talking with you after surgery if she did. Her husband told her you mentioned something about her "small intestine being taped inside of her and you straightened it out".  She questioned if that could cause bowel problems.  She has not had BM since surgery but she said with no more than she has eaten does not surprise her and she would be expecting to tomorrow or the next day.  Patient called back since she left these questions with a few more. She said she was ready literature provided and it said to talk with health care provider regarding care of incision.   #1 When to remove bandage?  # 2 How to clean it?  #3 What to put on it instead--once bandage removed?

## 2014-10-31 NOTE — Telephone Encounter (Signed)
#  1 yes #2 I think it was causing her pain. Hopefully will get better now #3 remove bandage now #4 just regular soap and water when she showers over the incision sites. #5 nothing needed to cover it unless it's irritated from her pants  then a Band-Aid is okay

## 2014-10-31 NOTE — Telephone Encounter (Signed)
Had surgery 10/29/14.  C/o "mucous in the back of my throat" and is wondering if okay to take an "herbal remedy type of thing".  Also wants to know if ok to drink "Smooth move tea" to get her bowels straightened out since surgery?

## 2014-10-31 NOTE — Telephone Encounter (Signed)
Okay for both

## 2014-10-31 NOTE — Telephone Encounter (Signed)
I called patient back and relayed these answers. She had several more questions.   #1  She is lightheaded today. Afraid to get up and move around. Was not like this yesterday. "Is this normal". I told her not "normal" but not sure you will know what is causing it. You may recommend drink plenty of fluids and rest today and see how goes but I would check with you for your recommendation.  #2 "What about the transdermal patch. Can I remove it?"  It is good for 72 hours and she put it on Monday evening. I told her fine to remove it.

## 2014-11-01 MED FILL — Heparin Sodium (Porcine) Inj 5000 Unit/ML: INTRAMUSCULAR | Qty: 1 | Status: AC

## 2014-11-12 ENCOUNTER — Encounter: Payer: Self-pay | Admitting: Gynecology

## 2014-11-12 ENCOUNTER — Ambulatory Visit (INDEPENDENT_AMBULATORY_CARE_PROVIDER_SITE_OTHER): Payer: Medicare Other | Admitting: Gynecology

## 2014-11-12 VITALS — BP 122/76

## 2014-11-12 DIAGNOSIS — Z9889 Other specified postprocedural states: Secondary | ICD-10-CM

## 2014-11-12 NOTE — Patient Instructions (Signed)
Follow up in January 2017 for annual exam, sooner if any issues.

## 2014-11-12 NOTE — Progress Notes (Signed)
Annette Barker [birth year] [id number]        69 y.o.  [id number] presents for her postoperative visit 2 weeks status post laparoscopic bilateral salpingo-oophorectomy. Doing well without complaints.  Past medical history,surgical history, problem list, medications, allergies, family history and social history were all reviewed and documented in the EPIC chart.  Directed ROS with pertinent positives and negatives documented in the history of present illness/assessment and plan.  Exam: Kim assistant Filed Vitals:   11/12/14 1229  BP: 122/76   General appearance:  Normal Abdomen soft nontender without masses guarding rebound. Incision sites healing nicely. Pelvic bimanual exam without masses or tenderness  Assessment/Plan:  70 y.o. [id number] with normal postoperative check 2 weeks status post laparoscopic bilateral soaping oophorectomy for pain and persistent ovarian cyst. Reviewed pathology with her which showed benign follicular cysts. Patient will resume all normal activities and follow up with me January 2017 when she is due for her annual exam. She is off of her HRT and doing well and will remain off of her HRT. She does feel though that she needs the vaginal estrogen due to dryness and discomfort with intercourse and she is going to continue this twice weekly. She'll report any vaginal bleeding.    [name] MD, 12:48 PM 11/12/2014

## 2014-12-08 ENCOUNTER — Telehealth: Payer: Self-pay

## 2014-12-08 NOTE — Telephone Encounter (Signed)
Patient called stating she has 2 Diazepam left and needs [place] to call another Rx in today as she will be going out of town on Saturday.  Asked we expedite it and call it in today as she will only be in [place] today and needs to get it at her pharmacy there.  Patient will be advised Dr. Loetta Rough is out of office this week.

## 2014-12-09 ENCOUNTER — Other Ambulatory Visit: Payer: Self-pay

## 2014-12-09 MED ORDER — DIAZEPAM 5 MG PO TABS: ORAL_TABLET | ORAL | Status: DC

## 2014-12-09 NOTE — Telephone Encounter (Signed)
Refill sent through already.

## 2014-12-09 NOTE — Telephone Encounter (Signed)
Rx called in 

## 2014-12-09 NOTE — Telephone Encounter (Signed)
Patient informed. Rx sent 

## 2015-01-05 DIAGNOSIS — R21 Rash and other nonspecific skin eruption: Secondary | ICD-10-CM | POA: Diagnosis not present

## 2015-01-05 DIAGNOSIS — Z9189 Other specified personal risk factors, not elsewhere classified: Secondary | ICD-10-CM | POA: Diagnosis not present

## 2015-01-05 DIAGNOSIS — W57XXXA Bitten or stung by nonvenomous insect and other nonvenomous arthropods, initial encounter: Secondary | ICD-10-CM | POA: Diagnosis not present

## 2015-01-21 DIAGNOSIS — J019 Acute sinusitis, unspecified: Secondary | ICD-10-CM | POA: Diagnosis not present

## 2015-03-24 DIAGNOSIS — M810 Age-related osteoporosis without current pathological fracture: Secondary | ICD-10-CM

## 2015-03-24 HISTORY — DX: Age-related osteoporosis without current pathological fracture: M81.0

## 2015-04-03 ENCOUNTER — Other Ambulatory Visit: Payer: Self-pay | Admitting: *Deleted

## 2015-04-03 MED ORDER — DIAZEPAM 5 MG PO TABS: ORAL_TABLET | ORAL | Status: DC

## 2015-04-03 NOTE — Telephone Encounter (Signed)
Rx called in 

## 2015-04-03 NOTE — Telephone Encounter (Signed)
Last filled in July with 4 refills

## 2015-04-06 ENCOUNTER — Telehealth: Payer: Self-pay

## 2015-04-06 MED ORDER — DIAZEPAM 5 MG PO TABS: ORAL_TABLET | ORAL | Status: DC

## 2015-04-06 NOTE — Telephone Encounter (Signed)
Encounter already opened. 

## 2015-04-06 NOTE — Telephone Encounter (Signed)
Okay to complete the refills as originally prescribed

## 2015-04-06 NOTE — Telephone Encounter (Signed)
Patient called because she needs her Diazepam sent to her [facility]. It was sent there originally but transferred to [facility] in [place]. She said it has 4 refills left but they cannot transfer it back because of type Rx it is. I called [facility] in [place] and confirmed she has #120 tabs or 4 refills left on the Rx. I cancelled those explaining that I was sending Rx to [facility] pharmacy.  Dr. Loetta Rough, in meantime I see you sent Rx for #30 on 04/03/15 as pharmacy must have called for refill. Ok to put her the 3 refills that she would have left on it?

## 2015-04-06 NOTE — Telephone Encounter (Signed)
I called [facility] in [place] but he did not have the 04/03/15 Rx. So I provided the Rx that is here in [place] #30 x 3 refills. Documented in meds.

## 2015-04-06 NOTE — Telephone Encounter (Signed)
Patient informed. 

## 2015-04-14 ENCOUNTER — Encounter: Payer: Self-pay | Admitting: Gynecology

## 2015-04-14 ENCOUNTER — Ambulatory Visit (INDEPENDENT_AMBULATORY_CARE_PROVIDER_SITE_OTHER): Payer: Medicare Other

## 2015-04-14 ENCOUNTER — Telehealth: Payer: Self-pay | Admitting: Gynecology

## 2015-04-14 DIAGNOSIS — M810 Age-related osteoporosis without current pathological fracture: Secondary | ICD-10-CM | POA: Diagnosis not present

## 2015-04-14 NOTE — Telephone Encounter (Signed)
Tell patient that her bone density does show osteoporosis with statistically significant decline at her spine and left hip. Recommend office visit to discuss treatment options. She left a message to ask about the use of thyroid medication and osteoporosis. The issue with thyroid is if she was hyperthyroid with higher levels of thyroid hormone this could lead to accelerated osteoporosis. She should be monitored for this by her primary physician who is prescribing the thyroid medication.

## 2015-04-20 NOTE — Telephone Encounter (Signed)
Pt informed with the below note. 

## 2015-04-21 DIAGNOSIS — M25532 Pain in left wrist: Secondary | ICD-10-CM | POA: Diagnosis not present

## 2015-04-23 ENCOUNTER — Telehealth: Payer: Self-pay

## 2015-04-23 NOTE — Telephone Encounter (Signed)
Patient called again. Said she needs you to review at CT Scan she had at [facility] in November. She said she was told by the orthopedist that her hip pain has to do with the veins in the floor of her pelvis.  She said this is extremely painful at times especially with walking on concrete. She said she thought this pain was related to her ov cyst before.  She said Orthopedist told her to talk with Dr. Felipa Eth about it and Dr. Chauncey Cruel told her to talk with you about it.  04/09/15 "Reproductive: The uterus and ovaries are normal. Dilated parametrial veins on the left side may suggest pelvic congestion syndrome. The bladder is normal. No pelvic mass, adenopathy or free pelvic fluid collections. No inguinal mass or adenopathy."  Patient asked is there anything that can be done about this? Rec?

## 2015-04-23 NOTE — Telephone Encounter (Signed)
Patient said she received call informing her she had osteoporosis and needs to schedule appt to come talk with you.  She has her yearly scheduled for 06/16/15 at 2:30pm and asked if she needed to schedule separate appt or if you could talk with her then about bone density.

## 2015-04-23 NOTE — Telephone Encounter (Signed)
Pelvic congestion syndrome is a poorly defined condition where the pelvic veins dilate and people attributed symptoms to this such as pelvic heaviness. It should not account for extreme pain but is a more ill-defined heaviness in the pelvis. Would be extremely unlikely in a postmenopausal woman. I doubt this is these real source of her discomfort. There really is no good treatment for this. People would do hysterectomies but the pelvic veins may still we remain dilated.  Lastly I did have a good look at the pelvic side walls during her laparoscopic surgery and it did not appear to show significantly dilated blood vessels. I can show her these pictures at her office visit.

## 2015-04-23 NOTE — Telephone Encounter (Signed)
I can talk to her then. Make sure she reminds me in the event I forget for some reason

## 2015-04-24 NOTE — Telephone Encounter (Signed)
Patient advised. I read her the paragraph.

## 2015-04-24 NOTE — Telephone Encounter (Signed)
Patient advised.

## 2015-05-27 ENCOUNTER — Encounter: Payer: Self-pay | Admitting: Family Medicine

## 2015-05-27 DIAGNOSIS — E7800 Pure hypercholesterolemia, unspecified: Secondary | ICD-10-CM | POA: Insufficient documentation

## 2015-05-27 DIAGNOSIS — F419 Anxiety disorder, unspecified: Secondary | ICD-10-CM | POA: Insufficient documentation

## 2015-05-28 ENCOUNTER — Ambulatory Visit (INDEPENDENT_AMBULATORY_CARE_PROVIDER_SITE_OTHER): Payer: Medicare Other | Admitting: Family Medicine

## 2015-05-28 ENCOUNTER — Encounter: Payer: Self-pay | Admitting: Family Medicine

## 2015-05-28 VITALS — BP 120/74 | HR 68 | Temp 98.4°F | Resp 18 | Ht 63.0 in | Wt 130.0 lb

## 2015-05-28 DIAGNOSIS — M810 Age-related osteoporosis without current pathological fracture: Secondary | ICD-10-CM

## 2015-05-28 DIAGNOSIS — Z7689 Persons encountering health services in other specified circumstances: Secondary | ICD-10-CM

## 2015-05-28 DIAGNOSIS — E038 Other specified hypothyroidism: Secondary | ICD-10-CM | POA: Diagnosis not present

## 2015-05-28 DIAGNOSIS — Z7189 Other specified counseling: Secondary | ICD-10-CM | POA: Diagnosis not present

## 2015-05-28 DIAGNOSIS — E785 Hyperlipidemia, unspecified: Secondary | ICD-10-CM

## 2015-05-28 MED ORDER — HYDROCODONE-HOMATROPINE 5-1.5 MG/5ML PO SYRP: 5.0000 mL | ORAL_SOLUTION | Freq: Four times a day (QID) | ORAL | Status: DC | PRN

## 2015-05-28 NOTE — Progress Notes (Signed)
Subjective:    Patient ID: Annette Barker, female    DOB: Dec 26, 1945, 70 y.o.   MRN: [id number]  HPI Here to re establish care.   Last DEXA was 11/16 and diagnosed her with osteoporosis.  Last colonoscopy was 2012.  Past medical history is significant for hypothyroidism, osteoporosis, hyperlipidemia. She is currently taking vaginal estrogen for vaginal dryness. She is on no hormone replacement therapy however she is going to discuss this with her gynecologist as she has had a noticeably decreased libido since discontinuing hormone replacement therapy. Patient states she has had Pneumovax 23. She receives the flu shot today. She is hesitant to receive  Prevnar 13 after she had an adverse reaction to Pneumovax 23. Her gynecologist performs her Pap smears and her pelvic. She is overdue for fasting lab work.   Past Medical History  Diagnosis Date  . IBS (irritable bowel syndrome)   . Hair loss   . Thyroid disease     hypothyroid  . Hemorrhoids   . Family history of adverse reaction to anesthesia     mother had throat surgery for polyp removal and developed early onset alzheimer's   . Hypothyroidism     on synthroid  . Arthritis     in her hands   . Anxiety   . Elevated cholesterol   . Osteoporosis 03/2015    T score -2.8   Past Surgical History  Procedure Laterality Date  . Shoulder surgery    . Tubal ligation    . Laparoscopic salpingo oopherectomy Bilateral 10/29/2014    Procedure: LAPAROSCOPIC SALPINGO OOPHORECTOMY;  Surgeon: [name], MD;  Location: [facility];  Service: Gynecology;  Laterality: Bilateral;  1:00pm OR time requested  1 1/2 hours OR time requested.   Current Outpatient Prescriptions on File Prior to Visit  Medication Sig Dispense Refill  . desonide (DESOWEN) 0.05 % cream Apply 1 application topically 2 (two) times daily as needed (rash).     . diazepam (VALIUM) 5 MG tablet Take 1/4 of tablet by mouth at bedtime nightly. 30 tablet 3  . diphenhydrAMINE  (BENADRYL) 12.5 MG chewable tablet Chew 12.5 mg by mouth at bedtime.    . Estradiol (ESTROGEL) 0.75 MG/1.25 GM (0.06%) topical gel Use one squirt daily (Patient not taking: Reported on 11/12/2014) 1.25 g 6  . levothyroxine (SYNTHROID, LEVOTHROID) 75 MCG tablet Take 75 mcg by mouth daily before breakfast.    . medroxyPROGESTERone (PROVERA) 2.5 MG tablet Take 1 tablet (2.5 mg total) by mouth daily. (Patient not taking: Reported on 09/30/2014) 30 tablet 12  . NONFORMULARY OR COMPOUNDED ITEM Estradiol 0.02% vaginal cream twice weekly (Patient not taking: Reported on 09/30/2014) 90 each 3  . Probiotic Product (PROBIOTIC COMPLEX ACIDOPHILUS PO) Take 15 mLs by mouth daily. Liquid Probiotic     No current facility-administered medications on file prior to visit.   Allergies  Allergen Reactions  . Statins Other (See Comments)    Body cramps all over body   Social History   Social History  . Marital Status: Married    Spouse Name: N/A  . Number of Children: N/A  . Years of Education: N/A   Occupational History  . Not on file.   Social History Main Topics  . Smoking status: Former Smoker    Types: Cigarettes    Quit date: 05/27/1991  . Smokeless tobacco: Never Used  . Alcohol Use: 3.0 oz/week    0 Standard drinks or equivalent, 5 Cans of beer per week  Comment: rare  . Drug Use: No  . Sexual Activity: Yes    Birth Control/ Protection: Post-menopausal     Comment: 1st intercourse 70 yo--Fewer than 5 partners   Other Topics Concern  . Not on file   Social History Narrative   Family History  Problem Relation Age of Onset  . Alzheimer's disease Mother   . Arthritis Mother   . Osteoporosis Father   . Cancer Father     leukemia  . Arthritis Father   . Hyperlipidemia Father   . Cancer Sister     PERITONEAL CANCER  . Arthritis Sister   . Early death Sister   . Alcohol abuse Sister   . Colitis Son   . Heart disease Sister   . Arthritis Sister     Review of Systems       Objective:   Physical Exam  Constitutional: She is oriented to person, place, and time. She appears well-developed and well-nourished. No distress.  HENT:  Head: Normocephalic and atraumatic.  Right Ear: External ear normal.  Left Ear: External ear normal.  Nose: Nose normal.  Mouth/Throat: Oropharynx is clear and moist. No oropharyngeal exudate.  Eyes: Conjunctivae and EOM are normal. Pupils are equal, round, and reactive to light. Right eye exhibits no discharge. Left eye exhibits no discharge.  Neck: Normal range of motion. Neck supple. No JVD present. No tracheal deviation present. No thyromegaly present.  Cardiovascular: Normal rate, regular rhythm, normal heart sounds and intact distal pulses.  Exam reveals no gallop and no friction rub.   No murmur heard. Pulmonary/Chest: Effort normal and breath sounds normal. No stridor. No respiratory distress. She has no wheezes. She has no rales. She exhibits no tenderness.  Abdominal: Soft. Bowel sounds are normal. She exhibits no distension and no mass. There is no tenderness. There is no rebound and no guarding.  Musculoskeletal: Normal range of motion. She exhibits no edema or tenderness.  Lymphadenopathy:    She has no cervical adenopathy.  Neurological: She is alert and oriented to person, place, and time. She has normal reflexes. She displays normal reflexes. No cranial nerve deficit. She exhibits normal muscle tone. Coordination normal.  Skin: Skin is warm. No rash noted. She is not diaphoretic. No erythema. No pallor.  Psychiatric: She has a normal mood and affect. Her behavior is normal. Judgment and thought content normal.  Vitals reviewed.         Assessment & Plan:  Establishing care with new doctor, encounter for  Other specified hypothyroidism  Osteoporosis   I would like the patient to return fasting for a CBC, CMP, fasting lipid panel, and a TSH. I recommended 1200 mg a day of calcium. I recommended 1000 units a day  of vitamin D. Her Pap smear and her pelvic exam performed by her gynecologist. She will discuss resuming hormone replacement therapy with her gynecologist. I would recommend Fosamax for osteoporosis especially if she does not resume hormone replacement therapy. I would like to check a fasting lipid panel and treat her LDL cholesterol is significantly elevated. She received her flu shot today. I recommended returning her convenience for Prevnar 13. Colonoscopy is up-to-date.

## 2015-06-10 ENCOUNTER — Other Ambulatory Visit: Payer: Medicare Other

## 2015-06-10 DIAGNOSIS — E785 Hyperlipidemia, unspecified: Secondary | ICD-10-CM | POA: Diagnosis not present

## 2015-06-10 DIAGNOSIS — Z7189 Other specified counseling: Secondary | ICD-10-CM | POA: Diagnosis not present

## 2015-06-10 DIAGNOSIS — M810 Age-related osteoporosis without current pathological fracture: Secondary | ICD-10-CM | POA: Diagnosis not present

## 2015-06-10 DIAGNOSIS — E038 Other specified hypothyroidism: Secondary | ICD-10-CM | POA: Diagnosis not present

## 2015-06-10 LAB — CBC WITH DIFFERENTIAL/PLATELET
Basophils Absolute: 0.1 10*3/uL (ref 0.0–0.1)
Basophils Relative: 1 % (ref 0–1)
EOS PCT: 3 % (ref 0–5)
Eosinophils Absolute: 0.2 10*3/uL (ref 0.0–0.7)
HEMATOCRIT: 34.8 % — AB (ref 36.0–46.0)
Hemoglobin: 11.6 g/dL — ABNORMAL LOW (ref 12.0–15.0)
Lymphocytes Relative: 33 % (ref 12–46)
Lymphs Abs: 2.1 10*3/uL (ref 0.7–4.0)
MCH: 33.4 pg (ref 26.0–34.0)
MCHC: 33.3 g/dL (ref 30.0–36.0)
MCV: 100.3 fL — ABNORMAL HIGH (ref 78.0–100.0)
MPV: 9.2 fL (ref 8.6–12.4)
Monocytes Absolute: 0.5 10*3/uL (ref 0.1–1.0)
Monocytes Relative: 8 % (ref 3–12)
Neutro Abs: 3.5 10*3/uL (ref 1.7–7.7)
Neutrophils Relative %: 55 % (ref 43–77)
PLATELETS: 342 10*3/uL (ref 150–400)
RBC: 3.47 MIL/uL — ABNORMAL LOW (ref 3.87–5.11)
RDW: 13.9 % (ref 11.5–15.5)
WBC: 6.3 10*3/uL (ref 4.0–10.5)

## 2015-06-10 LAB — COMPLETE METABOLIC PANEL WITH GFR
ALT: 16 U/L (ref 6–29)
AST: 17 U/L (ref 10–35)
Albumin: 4.1 g/dL (ref 3.6–5.1)
Alkaline Phosphatase: 69 U/L (ref 33–130)
BUN: 11 mg/dL (ref 7–25)
CALCIUM: 9.4 mg/dL (ref 8.6–10.4)
CO2: 27 mmol/L (ref 20–31)
CREATININE: 0.69 mg/dL (ref 0.50–0.99)
Chloride: 102 mmol/L (ref 98–110)
GFR, Est African American: >89 mL/min (ref >=60)
GFR, Est Non African American: >89 mL/min (ref >=60)
GLUCOSE: 82 mg/dL (ref 70–99)
Potassium: 4.6 mmol/L (ref 3.5–5.3)
Sodium: 139 mmol/L (ref 135–146)
Total Bilirubin: 0.5 mg/dL (ref 0.2–1.2)
Total Protein: 7.1 g/dL (ref 6.1–8.1)

## 2015-06-10 LAB — LIPID PANEL
CHOL/HDL RATIO: 3.8 ratio (ref <=5.0)
Cholesterol: 256 mg/dL — ABNORMAL HIGH (ref 125–200)
HDL: 67 mg/dL (ref >=46)
LDL CALC: 172 mg/dL — AB (ref <130)
Triglycerides: 86 mg/dL (ref <150)
VLDL: 17 mg/dL (ref <30)

## 2015-06-10 LAB — TSH: TSH: 1.041 u[IU]/mL (ref 0.350–4.500)

## 2015-06-12 ENCOUNTER — Other Ambulatory Visit: Payer: Self-pay | Admitting: Family Medicine

## 2015-06-12 DIAGNOSIS — E785 Hyperlipidemia, unspecified: Secondary | ICD-10-CM

## 2015-06-12 DIAGNOSIS — E038 Other specified hypothyroidism: Secondary | ICD-10-CM

## 2015-06-12 DIAGNOSIS — Z79899 Other long term (current) drug therapy: Secondary | ICD-10-CM

## 2015-06-12 MED ORDER — PRAVASTATIN SODIUM 20 MG PO TABS: 20.0000 mg | ORAL_TABLET | Freq: Every day | ORAL | Status: DC

## 2015-06-16 ENCOUNTER — Ambulatory Visit (INDEPENDENT_AMBULATORY_CARE_PROVIDER_SITE_OTHER): Payer: Medicare Other | Admitting: Gynecology

## 2015-06-16 ENCOUNTER — Encounter: Payer: Self-pay | Admitting: Gynecology

## 2015-06-16 VITALS — BP 122/76 | Ht 64.0 in | Wt 127.0 lb

## 2015-06-16 DIAGNOSIS — N951 Menopausal and female climacteric states: Secondary | ICD-10-CM

## 2015-06-16 DIAGNOSIS — Z79890 Hormone replacement therapy (postmenopausal): Secondary | ICD-10-CM | POA: Diagnosis not present

## 2015-06-16 DIAGNOSIS — R8279 Other abnormal findings on microbiological examination of urine: Secondary | ICD-10-CM | POA: Diagnosis not present

## 2015-06-16 DIAGNOSIS — N952 Postmenopausal atrophic vaginitis: Secondary | ICD-10-CM | POA: Diagnosis not present

## 2015-06-16 DIAGNOSIS — Z01419 Encounter for gynecological examination (general) (routine) without abnormal findings: Secondary | ICD-10-CM

## 2015-06-16 MED ORDER — ESTRADIOL 0.75 MG/1.25 GM (0.06%) TD GEL: TRANSDERMAL | Status: DC

## 2015-06-16 MED ORDER — PROGESTERONE MICRONIZED 100 MG PO CAPS: 100.0000 mg | ORAL_CAPSULE | Freq: Every day | ORAL | Status: DC

## 2015-06-16 NOTE — Progress Notes (Signed)
SUZELLE SORY [birth year] [id number]        70 y.o.  [id number]  for breast and pelvic exam. Several issues noted below.  Past medical history,surgical history, problem list, medications, allergies, family history and social history were all reviewed and documented as reviewed in the EPIC chart.  ROS:  Performed with pertinent positives and negatives included in the history, assessment and plan.   Additional significant findings :  none   Exam: [name] assistant Filed Vitals:   06/16/15 1434  BP: 122/76  Height: 5\' 4"  (1.626 m)  Weight: 127 lb (57.607 kg)   General appearance:  Normal affect, orientation and appearance. Skin: Grossly normal HEENT: Without gross lesions.  No cervical or supraclavicular adenopathy. Thyroid normal.  Lungs:  Clear without wheezing, rales or rhonchi Cardiac: RR, without RMG Abdominal:  Soft, nontender, without masses, guarding, rebound, organomegaly or hernia Breasts:  Examined lying and sitting without masses, retractions, discharge or axillary adenopathy. Pelvic:  Ext/BUS/vagina with atrophic changes  Cervix with atrophic changes  Uterus anteverted, normal size, shape and contour, midline and mobile nontender   Adnexa  Without masses or tenderness    Anus and perineum  Normal   Rectovaginal  Normal sphincter tone without palpated masses or tenderness.    Assessment/Plan:  70 y.o. [id number] female for breast and pelvic exam.   1. Postmenopausal/atrophic genital changes/menopausal symptoms. Patient had been on estrogen gel and medroxyprogesterone 2.5 mg daily. She had weaned herself off and notes that she now is having hot flashes, night sweats, joint pain and increasing urinary urgency. Also most recently had a DEXA which showed osteoporosis T score -2.8 with statistically significant decline. I reviewed options with her to include reinitiation of HRT accepting the risks as outlined with the WHI study and other studies including increased risk of  stroke heart attack DVT and breast cancer. Patient very interested in reinitiation. Understands and accepts the risks. She has been using estradiol vaginal cream twice weekly and is not having issues with vaginal dryness. Prefers to go back on estrogen gel. Estrogen gel 1 year, Prometrium 100 mg at bedtime 1 year provided. Patient will call me if she has any issues with reinitiation or she does any vaginal bleeding. 2. Mammography overdue with last mammogram 01/2013.  Strongly recommended patient schedule baseline mammogram. Breast cancer is most common cancer in women discussed. Benefit of early detection discussed. Patient agrees to schedule. SBE monthly reviewed. 3. DEXA 03/2015 T score -2.8. Statistically significant loss from prior study. Options reviewed and she is reinitiating HRT we both agreed to consider this a treatment and will restudy with repeat DEXA in 2 years. Increase calcium vitamin D reviewed. I asked her make sure she has vitamin D level checked when she has her blood drawn through her primary physician's office.  4. Colonoscopy 2010 with reported repeat interval 10 years.  Patient had questions about alternatives and I have referred her to her primary physician to discuss this. 5. Pap smear 2016. No Pap smear done today. No history of significant abnormal Pap smears previously. 6. Health maintenance. No routine blood work done as patient has this done at her primary [facility] office follow up in one year, sooner if any issues with HRT.   [name] MD, 4:01 PM 06/16/2015

## 2015-06-16 NOTE — Patient Instructions (Signed)
Start back on hormone replacement therapy as we discussed. Call me if you do any vaginal bleeding or have any issues with this.  You may obtain a copy of any labs that were done today by logging onto MyChart as outlined in the instructions provided with your AVS (after visit summary). The office will not call with normal lab results but certainly if there are any significant abnormalities then we will contact you.   Health Maintenance Adopting a healthy lifestyle and getting preventive care can go a long way to promote health and wellness. Talk with your health care provider about what schedule of regular examinations is right for you. This is a good chance for you to check in with your provider about disease prevention and staying healthy. In between checkups, there are plenty of things you can do on your own. Experts have done a lot of research about which lifestyle changes and preventive measures are most likely to keep you healthy. Ask your health care provider for more information. WEIGHT AND DIET  Eat a healthy diet  Be sure to include plenty of vegetables, fruits, low-fat dairy products, and lean protein.  Do not eat a lot of foods high in solid fats, added sugars, or salt.  Get regular exercise. This is one of the most important things you can do for your health.  Most adults should exercise for at least 150 minutes each week. The exercise should increase your heart rate and make you sweat (moderate-intensity exercise).  Most adults should also do strengthening exercises at least twice a week. This is in addition to the moderate-intensity exercise.  Maintain a healthy weight  Body mass index (BMI) is a measurement that can be used to identify possible weight problems. It estimates body fat based on height and weight. Your health care provider can help determine your BMI and help you achieve or maintain a healthy weight.  For females 55 years of age and older:   A BMI below 18.5 is  considered underweight.  A BMI of 18.5 to 24.9 is normal.  A BMI of 25 to 29.9 is considered overweight.  A BMI of 30 and above is considered obese.  Watch levels of cholesterol and blood lipids  You should start having your blood tested for lipids and cholesterol at 68 years of age, then have this test every 5 years.  You may need to have your cholesterol levels checked more often if:  Your lipid or cholesterol levels are high.  You are older than 68 years of age.  You are at high risk for heart disease.  CANCER SCREENING   Lung Cancer  Lung cancer screening is recommended for adults 63-60 years old who are at high risk for lung cancer because of a history of smoking.  A yearly low-dose CT scan of the lungs is recommended for people who:  Currently smoke.  Have quit within the past 15 years.  Have at least a 30-pack-year history of smoking. A pack year is smoking an average of one pack of cigarettes a day for 1 year.  Yearly screening should continue until it has been 15 years since you quit.  Yearly screening should stop if you develop a health problem that would prevent you from having lung cancer treatment.  Breast Cancer  Practice breast self-awareness. This means understanding how your breasts normally appear and feel.  It also means doing regular breast self-exams. Let your health care provider know about any changes, no matter how small.  If you are in your 20s or 30s, you should have a clinical breast exam (CBE) by a health care provider every 1-3 years as part of a regular health exam.  If you are 90 or older, have a CBE every year. Also consider having a breast X-ray (mammogram) every year.  If you have a family history of breast cancer, talk to your health care provider about genetic screening.  If you are at high risk for breast cancer, talk to your health care provider about having an MRI and a mammogram every year.  Breast cancer gene (BRCA)  assessment is recommended for women who have family members with BRCA-related cancers. BRCA-related cancers include:  Breast.  Ovarian.  Tubal.  Peritoneal cancers.  Results of the assessment will determine the need for genetic counseling and BRCA1 and BRCA2 testing. Cervical Cancer Routine pelvic examinations to screen for cervical cancer are no longer recommended for nonpregnant women who are considered low risk for cancer of the pelvic organs (ovaries, uterus, and vagina) and who do not have symptoms. A pelvic examination may be necessary if you have symptoms including those associated with pelvic infections. Ask your health care provider if a screening pelvic exam is right for you.   The Pap test is the screening test for cervical cancer for women who are considered at risk.  If you had a hysterectomy for a problem that was not cancer or a condition that could lead to cancer, then you no longer need Pap tests.  If you are older than 65 years, and you have had normal Pap tests for the past 10 years, you no longer need to have Pap tests.  If you have had past treatment for cervical cancer or a condition that could lead to cancer, you need Pap tests and screening for cancer for at least 20 years after your treatment.  If you no longer get a Pap test, assess your risk factors if they change (such as having a new sexual partner). This can affect whether you should start being screened again.  Some women have medical problems that increase their chance of getting cervical cancer. If this is the case for you, your health care provider may recommend more frequent screening and Pap tests.  The human papillomavirus (HPV) test is another test that may be used for cervical cancer screening. The HPV test looks for the virus that can cause cell changes in the cervix. The cells collected during the Pap test can be tested for HPV.  The HPV test can be used to screen women 42 years of age and older.  Getting tested for HPV can extend the interval between normal Pap tests from three to five years.  An HPV test also should be used to screen women of any age who have unclear Pap test results.  After 68 years of age, women should have HPV testing as often as Pap tests.  Colorectal Cancer  This type of cancer can be detected and often prevented.  Routine colorectal cancer screening usually begins at 68 years of age and continues through 68 years of age.  Your health care provider may recommend screening at an earlier age if you have risk factors for colon cancer.  Your health care provider may also recommend using home test kits to check for hidden blood in the stool.  A small camera at the end of a tube can be used to examine your colon directly (sigmoidoscopy or colonoscopy). This is done to check for  the earliest forms of colorectal cancer.  Routine screening usually begins at age 71.  Direct examination of the colon should be repeated every 5-10 years through 68 years of age. However, you may need to be screened more often if early forms of precancerous polyps or small growths are found. Skin Cancer  Check your skin from head to toe regularly.  Tell your health care provider about any new moles or changes in moles, especially if there is a change in a mole's shape or color.  Also tell your health care provider if you have a mole that is larger than the size of a pencil eraser.  Always use sunscreen. Apply sunscreen liberally and repeatedly throughout the day.  Protect yourself by wearing long sleeves, pants, a wide-brimmed hat, and sunglasses whenever you are outside. HEART DISEASE, DIABETES, AND HIGH BLOOD PRESSURE   Have your blood pressure checked at least every 1-2 years. High blood pressure causes heart disease and increases the risk of stroke.  If you are between 84 years and 71 years old, ask your health care provider if you should take aspirin to prevent  strokes.  Have regular diabetes screenings. This involves taking a blood sample to check your fasting blood sugar level.  If you are at a normal weight and have a low risk for diabetes, have this test once every three years after 68 years of age.  If you are overweight and have a high risk for diabetes, consider being tested at a younger age or more often. PREVENTING INFECTION  Hepatitis B  If you have a higher risk for hepatitis B, you should be screened for this virus. You are considered at high risk for hepatitis B if:  You were born in a country where hepatitis B is common. Ask your health care provider which countries are considered high risk.  Your parents were born in a high-risk country, and you have not been immunized against hepatitis B (hepatitis B vaccine).  You have HIV or AIDS.  You use needles to inject street drugs.  You live with someone who has hepatitis B.  You have had sex with someone who has hepatitis B.  You get hemodialysis treatment.  You take certain medicines for conditions, including cancer, organ transplantation, and autoimmune conditions. Hepatitis C  Blood testing is recommended for:  Everyone born from [birth year] through 1963.  Anyone with known risk factors for hepatitis C. Sexually transmitted infections (STIs)  You should be screened for sexually transmitted infections (STIs) including gonorrhea and chlamydia if:  You are sexually active and are younger than 68 years of age.  You are older than 68 years of age and your health care provider tells you that you are at risk for this type of infection.  Your sexual activity has changed since you were last screened and you are at an increased risk for chlamydia or gonorrhea. Ask your health care provider if you are at risk.  If you do not have HIV, but are at risk, it may be recommended that you take a prescription medicine daily to prevent HIV infection. This is called pre-exposure prophylaxis  (PrEP). You are considered at risk if:  You are sexually active and do not regularly use condoms or know the HIV status of your partner(s).  You take drugs by injection.  You are sexually active with a partner who has HIV. Talk with your health care provider about whether you are at high risk of being infected with HIV. If you  choose to begin PrEP, you should first be tested for HIV. You should then be tested every 3 months for as long as you are taking PrEP.  PREGNANCY   If you are premenopausal and you may become pregnant, ask your health care provider about preconception counseling.  If you may become pregnant, take 400 to 800 micrograms (mcg) of folic acid every day.  If you want to prevent pregnancy, talk to your health care provider about birth control (contraception). OSTEOPOROSIS AND MENOPAUSE   Osteoporosis is a disease in which the bones lose minerals and strength with aging. This can result in serious bone fractures. Your risk for osteoporosis can be identified using a bone density scan.  If you are 31 years of age or older, or if you are at risk for osteoporosis and fractures, ask your health care provider if you should be screened.  Ask your health care provider whether you should take a calcium or vitamin D supplement to lower your risk for osteoporosis.  Menopause may have certain physical symptoms and risks.  Hormone replacement therapy may reduce some of these symptoms and risks. Talk to your health care provider about whether hormone replacement therapy is right for you.  HOME CARE INSTRUCTIONS   Schedule regular health, dental, and eye exams.  Stay current with your immunizations.   Do not use any tobacco products including cigarettes, chewing tobacco, or electronic cigarettes.  If you are pregnant, do not drink alcohol.  If you are breastfeeding, limit how much and how often you drink alcohol.  Limit alcohol intake to no more than 1 drink per day for  nonpregnant women. One drink equals 12 ounces of beer, 5 ounces of wine, or 1 ounces of hard liquor.  Do not use street drugs.  Do not share needles.  Ask your health care provider for help if you need support or information about quitting drugs.  Tell your health care provider if you often feel depressed.  Tell your health care provider if you have ever been abused or do not feel safe at home. Document Released: 11/22/2010 Document Revised: 09/23/2013 Document Reviewed: 04/10/2013 [facility] Patient Information 2015 [place], [place]. This information is not intended to replace advice given to you by your health care provider. Make sure you discuss any questions you have with your health care provider.

## 2015-06-17 LAB — URINALYSIS W MICROSCOPIC + REFLEX CULTURE
BACTERIA UA: NONE SEEN [HPF]
Bilirubin Urine: NEGATIVE
Casts: NONE SEEN [LPF]
Crystals: NONE SEEN [HPF]
GLUCOSE, UA: NEGATIVE
KETONES UR: NEGATIVE
Nitrite: NEGATIVE
Protein, ur: NEGATIVE
Specific Gravity, Urine: 1.007 (ref 1.001–1.035)
WBC UA: NONE SEEN WBC/HPF (ref <=5)
Yeast: NONE SEEN [HPF]
pH: 6 (ref 5.0–8.0)

## 2015-06-18 LAB — URINE CULTURE: Colony Count: >=100000

## 2015-07-14 ENCOUNTER — Telehealth: Payer: Self-pay | Admitting: Family Medicine

## 2015-07-14 NOTE — Telephone Encounter (Signed)
LMTRC

## 2015-07-14 NOTE — Telephone Encounter (Signed)
Patient has questions about getting an eye exam for a medicare physical  [phone]

## 2015-07-14 NOTE — Telephone Encounter (Signed)
Spoke to pt and informed her that she would have to call her ins to find out what they will cover and if we need to do a referral we will be happy to assist with that.

## 2015-07-21 DIAGNOSIS — H43813 Vitreous degeneration, bilateral: Secondary | ICD-10-CM | POA: Diagnosis not present

## 2015-08-06 ENCOUNTER — Ambulatory Visit (INDEPENDENT_AMBULATORY_CARE_PROVIDER_SITE_OTHER): Payer: Medicare Other | Admitting: Family Medicine

## 2015-08-06 ENCOUNTER — Encounter: Payer: Self-pay | Admitting: Family Medicine

## 2015-08-06 VITALS — BP 124/74 | HR 68 | Temp 98.1°F | Resp 16 | Ht 63.0 in | Wt 131.0 lb

## 2015-08-06 DIAGNOSIS — R053 Chronic cough: Secondary | ICD-10-CM

## 2015-08-06 DIAGNOSIS — M778 Other enthesopathies, not elsewhere classified: Secondary | ICD-10-CM | POA: Diagnosis not present

## 2015-08-06 DIAGNOSIS — R05 Cough: Secondary | ICD-10-CM | POA: Diagnosis not present

## 2015-08-06 DIAGNOSIS — J328 Other chronic sinusitis: Secondary | ICD-10-CM

## 2015-08-06 MED ORDER — CEFDINIR 300 MG PO CAPS: 300.0000 mg | ORAL_CAPSULE | Freq: Two times a day (BID) | ORAL | Status: DC

## 2015-08-06 MED ORDER — PREDNISONE 20 MG PO TABS: ORAL_TABLET | ORAL | Status: DC

## 2015-08-06 NOTE — Progress Notes (Signed)
Subjective:    Patient ID: Annette Barker, female    DOB: June 12, 1945, 70 y.o.   MRN: [id number]  HPI 05/28/15 Here to re establish care.   Last DEXA was 11/16 and diagnosed her with osteoporosis.  Last colonoscopy was 2012.  Past medical history is significant for hypothyroidism, osteoporosis, hyperlipidemia. She is currently taking vaginal estrogen for vaginal dryness. She is on no hormone replacement therapy however she is going to discuss this with her gynecologist as she has had a noticeably decreased libido since discontinuing hormone replacement therapy. Patient states she has had Pneumovax 23. She receives the flu shot today. She is hesitant to receive  Prevnar 13 after she had an adverse reaction to Pneumovax 23. Her gynecologist performs her Pap smears and her pelvic. She is overdue for fasting lab work.  At that time, my plan was:  I would like the patient to return fasting for a CBC, CMP, fasting lipid panel, and a TSH. I recommended 1200 mg a day of calcium. I recommended 1000 units a day of vitamin D. Her Pap smear and her pelvic exam performed by her gynecologist. She will discuss resuming hormone replacement therapy with her gynecologist. I would recommend Fosamax for osteoporosis especially if she does not resume hormone replacement therapy. I would like to check a fasting lipid panel and treat her LDL cholesterol is significantly elevated. She received her flu shot today. I recommended returning her convenience for Prevnar 13. Colonoscopy is up-to-date.  08/06/15 Patient has had a constant cough since January. It is nonproductive. She denies any chest pain or pleurisy or hemoptysis or shortness of breath. She denies any fever or weight loss. She does have constant postnasal drip, frontal sinus pain and pressure, sinus headache. It sounds as though the drainage is causing her cough. She is also had pain in the dorsum of her left wrist for several weeks. She has a positive Finkelstein test.  Pain is worse with wrist flexion.  Patient has seen an orthopedist and an x-ray was normal. Past Medical History  Diagnosis Date  . IBS (irritable bowel syndrome)   . Hair loss   . Thyroid disease     hypothyroid  . Hemorrhoids   . Hypothyroidism     on synthroid  . Arthritis     in her hands   . Anxiety   . Elevated cholesterol   . Osteoporosis 03/2015    T score -2.8   Past Surgical History  Procedure Laterality Date  . Shoulder surgery    . Tubal ligation    . Laparoscopic salpingo oopherectomy Bilateral 10/29/2014    Procedure: LAPAROSCOPIC SALPINGO OOPHORECTOMY;  Surgeon: [name], MD;  Location: [facility];  Service: Gynecology;  Laterality: Bilateral;  1:00pm OR time requested  1 1/2 hours OR time requested.   Current Outpatient Prescriptions on File Prior to Visit  Medication Sig Dispense Refill  . desonide (DESOWEN) 0.05 % cream Apply 1 application topically 2 (two) times daily as needed (rash). Reported on 06/16/2015    . diazepam (VALIUM) 5 MG tablet Take 1/4 of tablet by mouth at bedtime nightly. 30 tablet 3  . diphenhydrAMINE (BENADRYL) 12.5 MG chewable tablet Chew 12.5 mg by mouth at bedtime.    . Estradiol (ESTROGEL) 0.75 MG/1.25 GM (0.06%) topical gel Use one squirt daily 1.25 g 12  . levothyroxine (SYNTHROID, LEVOTHROID) 75 MCG tablet Take 75 mcg by mouth daily before breakfast.    . medroxyPROGESTERone (PROVERA) 2.5 MG tablet Take 1  tablet (2.5 mg total) by mouth daily. (Patient not taking: Reported on 05/28/2015) 30 tablet 12  . NONFORMULARY OR COMPOUNDED ITEM Estradiol 0.02% vaginal cream twice weekly 90 each 3  . pravastatin (PRAVACHOL) 20 MG tablet Take 1 tablet (20 mg total) by mouth daily. 30 tablet 3  . Probiotic Product (PROBIOTIC COMPLEX ACIDOPHILUS PO) Take 15 mLs by mouth daily. Reported on 05/28/2015    . progesterone (PROMETRIUM) 100 MG capsule Take 1 capsule (100 mg total) by mouth at bedtime. 30 capsule 12   No current facility-administered  medications on file prior to visit.   Allergies  Allergen Reactions  . Statins Other (See Comments)    Body cramps all over body   Social History   Social History  . Marital Status: Married    Spouse Name: N/A  . Number of Children: N/A  . Years of Education: N/A   Occupational History  . Not on file.   Social History Main Topics  . Smoking status: Former Smoker    Types: Cigarettes    Quit date: 05/27/1991  . Smokeless tobacco: Never Used  . Alcohol Use: 0.0 oz/week    0 Standard drinks or equivalent per week     Comment: rare  . Drug Use: No  . Sexual Activity: Yes    Birth Control/ Protection: Post-menopausal     Comment: 1st intercourse 69 yo--Fewer than 5 partners   Other Topics Concern  . Not on file   Social History Narrative   Family History  Problem Relation Age of Onset  . Alzheimer's disease Mother   . Arthritis Mother   . Osteoporosis Father   . Cancer Father     leukemia  . Arthritis Father   . Hyperlipidemia Father   . Cancer Sister     PERITONEAL CANCER  . Arthritis Sister   . Colitis Son   . Heart disease Sister   . Arthritis Sister   . Alcohol abuse Sister     Review of Systems      Objective:   Physical Exam  Constitutional: She appears well-developed and well-nourished. No distress.  HENT:  Head: Normocephalic and atraumatic.  Right Ear: External ear normal.  Left Ear: External ear normal.  Nose: Nose normal.  Mouth/Throat: Oropharynx is clear and moist.  Cardiovascular: Normal rate, regular rhythm, normal heart sounds and intact distal pulses.  Exam reveals no gallop and no friction rub.   No murmur heard. Pulmonary/Chest: Effort normal and breath sounds normal. No respiratory distress. She has no wheezes. She has no rales. She exhibits no tenderness.  Skin: She is not diaphoretic.  Vitals reviewed.         Assessment & Plan:  Chronic cough - Plan: DG Chest 2 View  Other chronic sinusitis - Plan: cefdinir (OMNICEF)  300 MG capsule, predniSONE (DELTASONE) 20 MG tablet, DISCONTINUED: predniSONE (DELTASONE) 20 MG tablet, DISCONTINUED: cefdinir (OMNICEF) 300 MG capsule  Left wrist tendonitis  She denies any heartburn. I will obtain a chest x-ray given the chronic cough but I suspect constant postnasal drip from a sinus infection. Begin a prednisone taper pack along with Omnicef. I believe she also has tendinitis in her left wrist. I recommended 2 weeks of immobilization with a thumb spica splint and then recheck if no better

## 2015-10-01 ENCOUNTER — Telehealth: Payer: Self-pay | Admitting: Family Medicine

## 2015-10-01 NOTE — Telephone Encounter (Signed)
Patient is calling to ask questions about her blood work, and ibs  [phone]

## 2015-10-01 NOTE — Telephone Encounter (Signed)
Pt had questions about labs and a new diet she is currently doing - recommended that pt come in for an apt and labs as it is time for her to follow up. Apt made for md and labs.

## 2015-10-03 ENCOUNTER — Other Ambulatory Visit: Payer: Self-pay | Admitting: Gynecology

## 2015-10-05 NOTE — Telephone Encounter (Signed)
Rx called in 

## 2015-10-05 NOTE — Telephone Encounter (Signed)
Last filled in 03/2015 with 3 refills

## 2015-10-08 ENCOUNTER — Other Ambulatory Visit: Payer: Medicare Other

## 2015-10-08 ENCOUNTER — Other Ambulatory Visit: Payer: Self-pay

## 2015-10-08 ENCOUNTER — Other Ambulatory Visit: Payer: Self-pay | Admitting: Family Medicine

## 2015-10-08 MED ORDER — NONFORMULARY OR COMPOUNDED ITEM: Status: DC

## 2015-10-08 NOTE — Telephone Encounter (Signed)
Ok

## 2015-10-08 NOTE — Telephone Encounter (Signed)
Per January 2017 office note patient was using estradiol vag cream but wanted to switch to Estrogen gel. She said she tried it but was having hotflashes on it. She wants to go back on Estradiol 0.02% vag cream.  OK to refill?   She said that you told her the Estrogen gel would offer some help to her in regards to osteoporosis. She said she expected she needed to go on something for that since she has discontinued the gel?

## 2015-10-08 NOTE — Telephone Encounter (Signed)
Patient advised. She wants to have time to make a decision. She is going to see PCP tomorrow and wants to discuss with him as well as read about it online. She said she will call Monday and let us know what she would like do.

## 2015-10-08 NOTE — Telephone Encounter (Signed)
It looks like the patient wants to stop her HRT altogether and just continue on the vaginal estrogen supplement. If this is correct then she should consider alternative treatment for the osteoporosis such as Fosamax weekly. Again the risks of Fosamax are reflux, osteonecrosis of the jaw and atypical fractures with prolonged use. It would be taking 1 tablet weekly on an empty stomach and remaining upright for 1 hour taking the medication.

## 2015-10-08 NOTE — Telephone Encounter (Signed)
See note below. Second question. What to tell her?

## 2015-10-13 ENCOUNTER — Ambulatory Visit (INDEPENDENT_AMBULATORY_CARE_PROVIDER_SITE_OTHER): Payer: Medicare Other | Admitting: Family Medicine

## 2015-10-13 ENCOUNTER — Encounter: Payer: Self-pay | Admitting: Family Medicine

## 2015-10-13 VITALS — BP 124/72 | HR 58 | Temp 97.6°F | Resp 14 | Ht 63.0 in | Wt 120.0 lb

## 2015-10-13 DIAGNOSIS — K589 Irritable bowel syndrome without diarrhea: Secondary | ICD-10-CM | POA: Diagnosis not present

## 2015-10-13 DIAGNOSIS — E785 Hyperlipidemia, unspecified: Secondary | ICD-10-CM

## 2015-10-13 DIAGNOSIS — R197 Diarrhea, unspecified: Secondary | ICD-10-CM | POA: Diagnosis not present

## 2015-10-13 LAB — COMPLETE METABOLIC PANEL WITH GFR
ALBUMIN: 4.1 g/dL (ref 3.6–5.1)
ALT: 12 U/L (ref 6–29)
AST: 14 U/L (ref 10–35)
Alkaline Phosphatase: 93 U/L (ref 33–130)
BILIRUBIN TOTAL: 0.4 mg/dL (ref 0.2–1.2)
BUN: 14 mg/dL (ref 7–25)
CO2: 26 mmol/L (ref 20–31)
CREATININE: 0.82 mg/dL (ref 0.50–0.99)
Calcium: 9.4 mg/dL (ref 8.6–10.4)
Chloride: 102 mmol/L (ref 98–110)
GFR, Est African American: 86 mL/min (ref >=60)
GFR, Est Non African American: 74 mL/min (ref >=60)
GLUCOSE: 85 mg/dL (ref 70–99)
Potassium: 4.1 mmol/L (ref 3.5–5.3)
SODIUM: 138 mmol/L (ref 135–146)
TOTAL PROTEIN: 6.7 g/dL (ref 6.1–8.1)

## 2015-10-13 LAB — CBC WITH DIFFERENTIAL/PLATELET
Basophils Absolute: 42 cells/uL (ref 0–200)
Basophils Relative: 1 %
EOS PCT: 4 %
Eosinophils Absolute: 168 cells/uL (ref 15–500)
HEMATOCRIT: 37.2 % (ref 35.0–45.0)
HEMOGLOBIN: 12.3 g/dL (ref 12.0–15.0)
LYMPHS ABS: 1554 {cells}/uL (ref 850–3900)
Lymphocytes Relative: 37 %
MCH: 32.6 pg (ref 27.0–33.0)
MCHC: 33.1 g/dL (ref 32.0–36.0)
MCV: 98.7 fL (ref 80.0–100.0)
MONO ABS: 462 {cells}/uL (ref 200–950)
MPV: 10.5 fL (ref 7.5–12.5)
Monocytes Relative: 11 %
NEUTROS ABS: 1974 {cells}/uL (ref 1500–7800)
Neutrophils Relative %: 47 %
Platelets: 255 10*3/uL (ref 140–400)
RBC: 3.77 MIL/uL — AB (ref 3.80–5.10)
RDW: 13.4 % (ref 11.0–15.0)
WBC: 4.2 10*3/uL (ref 3.8–10.8)

## 2015-10-13 LAB — LIPID PANEL
CHOLESTEROL: 209 mg/dL — AB (ref 125–200)
HDL: 57 mg/dL (ref >=46)
LDL Cholesterol: 138 mg/dL — ABNORMAL HIGH (ref <130)
Total CHOL/HDL Ratio: 3.7 Ratio (ref <=5.0)
Triglycerides: 72 mg/dL (ref <150)
VLDL: 14 mg/dL (ref <30)

## 2015-10-13 NOTE — Progress Notes (Signed)
Subjective:    Patient ID: Annette Barker, female    DOB: 02-10-1946, 70 y.o.   MRN: [id number]  HPI  Patient is here today complaining of IBS. She states that she's been diagnosed with IBS due to chronic daily watery diarrhea. She reports having had a colonoscopy several years ago that was completely normal. She states that there was no evidence of inflammatory bowel disease. She is also been checked for celiac disease in the past and found to be negative. Independently she is tried a gluten-free elimination diet with no improvement in her diarrhea. She denies any weight loss fevers chills black tarry stools or bloody stools. She denies any joint pain. Family history is significant for a son with ulcerative colitis. Her grandfather had stomach ulcers. Her daughter has some type of colitis although not specified. Past Medical History  Diagnosis Date  . IBS (irritable bowel syndrome)   . Hair loss   . Thyroid disease     hypothyroid  . Hemorrhoids   . Hypothyroidism     on synthroid  . Arthritis     in her hands   . Anxiety   . Elevated cholesterol   . Osteoporosis 03/2015    T score -2.8   Past Surgical History  Procedure Laterality Date  . Shoulder surgery    . Tubal ligation    . Laparoscopic salpingo oopherectomy Bilateral 10/29/2014    Procedure: LAPAROSCOPIC SALPINGO OOPHORECTOMY;  Surgeon: [name], MD;  Location: [facility];  Service: Gynecology;  Laterality: Bilateral;  1:00pm OR time requested  1 1/2 hours OR time requested.   Current Outpatient Prescriptions on File Prior to Visit  Medication Sig Dispense Refill  . cefdinir (OMNICEF) 300 MG capsule Take 1 capsule (300 mg total) by mouth 2 (two) times daily. 20 capsule 0  . desonide (DESOWEN) 0.05 % cream Apply 1 application topically 2 (two) times daily as needed (rash). Reported on 06/16/2015    . diazepam (VALIUM) 5 MG tablet TAKE 1/4 TABLET BY MOUTH EVERY NIGHT AT BEDTIME 30 tablet 0  . diphenhydrAMINE  (BENADRYL) 12.5 MG chewable tablet Chew 12.5 mg by mouth at bedtime.    Marland Kitchen levothyroxine (SYNTHROID, LEVOTHROID) 75 MCG tablet Take 75 mcg by mouth daily before breakfast.    . NONFORMULARY OR COMPOUNDED ITEM Estradiol 0.02% vaginal cream twice weekly 90 each 4  . pravastatin (PRAVACHOL) 20 MG tablet Take 1 tablet (20 mg total) by mouth daily. 30 tablet 3  . Probiotic Product (PROBIOTIC COMPLEX ACIDOPHILUS PO) Take 15 mLs by mouth daily. Reported on 05/28/2015    . progesterone (PROMETRIUM) 100 MG capsule Take 1 capsule (100 mg total) by mouth at bedtime. 30 capsule 12   No current facility-administered medications on file prior to visit.   Allergies  Allergen Reactions  . Statins Other (See Comments)    Body cramps all over body   Social History   Social History  . Marital Status: Married    Spouse Name: N/A  . Number of Children: N/A  . Years of Education: N/A   Occupational History  . Not on file.   Social History Main Topics  . Smoking status: Former Smoker    Types: Cigarettes    Quit date: 05/27/1991  . Smokeless tobacco: Never Used  . Alcohol Use: 0.0 oz/week    0 Standard drinks or equivalent per week     Comment: rare  . Drug Use: No  . Sexual Activity: Yes    Birth  Control/ Protection: Post-menopausal     Comment: 1st intercourse 70 yo--Fewer than 5 partners   Other Topics Concern  . Not on file   Social History Narrative     Review of Systems  All other systems reviewed and are negative.      Objective:   Physical Exam  Cardiovascular: Normal rate, regular rhythm and normal heart sounds.   Pulmonary/Chest: Effort normal and breath sounds normal. No respiratory distress. She has no wheezes. She has no rales.  Abdominal: Soft. Bowel sounds are normal. She exhibits no distension. There is no tenderness. There is no rebound and no guarding.  Vitals reviewed.         Assessment & Plan:  IBS (irritable bowel syndrome) - Plan: CBC with  Differential/Platelet, COMPLETE METABOLIC PANEL WITH GFR, Celiac panel 10  Diarrhea, unspecified type  HLD (hyperlipidemia) - Plan: Lipid panel  Patient may have irritable bowel syndrome. We will try Viberzi 75 mg pobid.  Recheck in one week. Samples were given. I will also do lab work to screen for celiac disease as well as I deficiencies that she's been complaining of some extremity cramping. She changed her diet significantly and would like to recheck her cholesterol and she was found to have significant elevations in her LDL cholesterol in January

## 2015-10-14 ENCOUNTER — Telehealth: Payer: Self-pay | Admitting: *Deleted

## 2015-10-14 LAB — CELIAC PANEL 10
Endomysial Screen: NEGATIVE
GLIADIN IGG: 2 U (ref <20)
Gliadin IgA: 9 Units (ref <20)
IgA: 287 mg/dL (ref 81–463)
Tissue Transglut Ab: 1 U/mL (ref <6)
Tissue Transglutaminase Ab, IgA: 1 U/mL (ref <4)

## 2015-10-14 NOTE — Telephone Encounter (Signed)
Pt called back from a conservation she had with Juliann Pulse about Dr.Fontaine wanting her to start on Fosamax for bones, pt said she would like to speak with TF about other options such a possibly prolia. I told pt this is fine she can schedule consults visit with TF, transferred to front desk

## 2015-10-14 NOTE — Telephone Encounter (Signed)
Pt informed with the below note, states she stopped the estrogen in mid march due to increased hot flashes. I re-read his message again and told her to try again for 3 months and see if relief. Pt thinks she didn't give medication enough time for body to adjust to medication  Pt is going to re-start estrogen and follow up in 3 months

## 2015-10-14 NOTE — Telephone Encounter (Signed)
(  Pt aware you are out of the office) pt called asking if you thought Boniva, Actonel, Prolia would be an option for her? Pt said she does suffer from stomach issues, states you spoke with her about fosamax, but she saw that fosamax had several side effects related to stomach. Pt would like to know if the 3 medications would be as effective as fosamax. Please advise

## 2015-10-14 NOTE — Telephone Encounter (Signed)
My impression was that since she was going back on estrogen, which is good for the bones, we were going to hold on the other meds and relook at her bones 2 years from her last bone density.

## 2015-10-27 ENCOUNTER — Telehealth: Payer: Self-pay | Admitting: *Deleted

## 2015-10-27 ENCOUNTER — Ambulatory Visit: Payer: Medicare Other | Admitting: Gynecology

## 2015-10-27 MED ORDER — ESTRADIOL 0.75 MG/1.25 GM (0.06%) TD GEL: TRANSDERMAL | Status: DC

## 2015-10-27 MED ORDER — PROGESTERONE MICRONIZED 100 MG PO CAPS: 100.0000 mg | ORAL_CAPSULE | Freq: Every day | ORAL | Status: DC

## 2015-10-27 NOTE — Telephone Encounter (Signed)
Okay for estrogen gel, Prometrium 100 mg at bedtime and formulated estradiol vaginal cream twice weekly

## 2015-10-27 NOTE — Telephone Encounter (Signed)
Pt was prescribed estrogel 0.06 % and progesterone 100 mg at bedtime, needs refill on estrogel and I will take care of that part. But pt mention she is also using the compound estradiol 0.02% vaginal cream (due to vaginal dryness that the HRT does not help with)  along with HRT, should pt be using estrogen vaginal cream along with HRT?   You told her that the HRT will help  her in regards to osteoporosis, but she does have vaginal dryness also. Pt said that you mention fosamax to her before and her PCP didn't think this would be a good choice due to her stomach issues. So if the answer is NO pt should not be taking the vaginal cream with HRT, she would like other options for medications for bones that would not cause stomach issues.

## 2015-10-27 NOTE — Telephone Encounter (Signed)
Pt aware with the below, she request printed Rx for estrogen gel, Rx printed and pt will come pick up

## 2015-11-11 ENCOUNTER — Telehealth: Payer: Self-pay | Admitting: Family Medicine

## 2015-11-11 NOTE — Telephone Encounter (Signed)
Patient says vibersi is working well would like more samples if possible  PATIENT IS OUT  [phone]

## 2015-11-12 NOTE — Telephone Encounter (Signed)
Samples left up front and pt aware to pick up 

## 2015-11-19 ENCOUNTER — Other Ambulatory Visit: Payer: Self-pay | Admitting: Gynecology

## 2015-11-19 MED ORDER — PROGESTERONE MICRONIZED 100 MG PO CAPS: 100.0000 mg | ORAL_CAPSULE | Freq: Every day | ORAL | Status: DC

## 2015-12-16 ENCOUNTER — Telehealth: Payer: Self-pay | Admitting: *Deleted

## 2015-12-16 NOTE — Telephone Encounter (Signed)
Pt called c/o progesterone price has increased to $100 per month which is not affordable for pt. I explained to pt we do not know cost of medication to contact the insurance company to see why medication is so expensive. Pt did this and called [facility] was told to send Rx to them to see if it could be bought at a cheaper price. I asked pt to get me the # or address to send in.

## 2015-12-18 MED ORDER — PROGESTERONE MICRONIZED 100 MG PO CAPS: 100.0000 mg | ORAL_CAPSULE | Freq: Every day | ORAL | 1 refills | Status: DC

## 2015-12-18 NOTE — Telephone Encounter (Signed)
Pt called back and gave me the fax # to send Rx [phone]. Rx will be printed and faxed to above #

## 2015-12-31 ENCOUNTER — Telehealth: Payer: Self-pay | Admitting: Family Medicine

## 2015-12-31 ENCOUNTER — Other Ambulatory Visit: Payer: Self-pay | Admitting: Gynecology

## 2015-12-31 NOTE — Telephone Encounter (Signed)
Called into pharmacy

## 2015-12-31 NOTE — Telephone Encounter (Signed)
Pt has questions about some of her medications.  Please call her.

## 2015-12-31 NOTE — Telephone Encounter (Signed)
Pt had questions about her diazepam as to weather [facility] needs to fill it or Dr. Phineas Real??? Told pt that it had already been called in to the pharm by Dr. Phineas Real so as long as he is willing to fill it let the pharm continue to send request.

## 2016-02-10 DIAGNOSIS — Z23 Encounter for immunization: Secondary | ICD-10-CM | POA: Diagnosis not present

## 2016-02-16 DIAGNOSIS — Z09 Encounter for follow-up examination after completed treatment for conditions other than malignant neoplasm: Secondary | ICD-10-CM | POA: Diagnosis not present

## 2016-02-16 DIAGNOSIS — Z1211 Encounter for screening for malignant neoplasm of colon: Secondary | ICD-10-CM | POA: Diagnosis not present

## 2016-02-16 DIAGNOSIS — K5730 Diverticulosis of large intestine without perforation or abscess without bleeding: Secondary | ICD-10-CM | POA: Diagnosis not present

## 2016-02-16 DIAGNOSIS — Z86010 Personal history of colonic polyps: Secondary | ICD-10-CM | POA: Diagnosis not present

## 2016-02-23 ENCOUNTER — Ambulatory Visit (INDEPENDENT_AMBULATORY_CARE_PROVIDER_SITE_OTHER): Payer: Medicare Other | Admitting: Family Medicine

## 2016-02-23 VITALS — BP 102/68 | HR 74 | Temp 98.1°F | Resp 14 | Ht 64.0 in | Wt 122.0 lb

## 2016-02-23 DIAGNOSIS — L249 Irritant contact dermatitis, unspecified cause: Secondary | ICD-10-CM | POA: Diagnosis not present

## 2016-02-23 MED ORDER — MOMETASONE FUROATE 0.1 % EX OINT: TOPICAL_OINTMENT | Freq: Every day | CUTANEOUS | 0 refills | Status: DC

## 2016-02-23 NOTE — Progress Notes (Signed)
Subjective:    Patient ID: Annette Barker, female    DOB: 1946/02/07, 70 y.o.   MRN: [id number]  HPI Patient reports a one-month history of an itchy rash on her chest. The rash are small erythematous papules approximately 3-4 mm in diameter. Many are excoriated with small pinpoint scabs. There are scattered all over her sternum and her upper chest which both shoulders. They're very itchy. She denies any known contacts. She denies any exposure to scabies. Her husband is not itching. She has not traveled recently. She is soaps or lotions.  Past Medical History:  Diagnosis Date  . Anxiety   . Arthritis    in her hands   . Elevated cholesterol   . Hair loss   . Hemorrhoids   . Hypothyroidism    on synthroid  . IBS (irritable bowel syndrome)   . Osteoporosis 03/2015   T score -2.8  . Thyroid disease    hypothyroid   Past Surgical History:  Procedure Laterality Date  . LAPAROSCOPIC SALPINGO OOPHERECTOMY Bilateral 10/29/2014   Procedure: LAPAROSCOPIC SALPINGO OOPHORECTOMY;  Surgeon: [name], MD;  Location: [facility];  Service: Gynecology;  Laterality: Bilateral;  1:00pm OR time requested  1 1/2 hours OR time requested.  Marland Kitchen SHOULDER SURGERY    . TUBAL LIGATION     Current Outpatient Prescriptions on File Prior to Visit  Medication Sig Dispense Refill  . desonide (DESOWEN) 0.05 % cream Apply 1 application topically 2 (two) times daily as needed (rash). Reported on 06/16/2015    . diazepam (VALIUM) 5 MG tablet TAKE 1/4 TABLET BY MOUTH EVERY NIGHT AT BEDTIME 30 tablet 0  . diphenhydrAMINE (BENADRYL) 12.5 MG chewable tablet Chew 12.5 mg by mouth at bedtime.    Marland Kitchen levothyroxine (SYNTHROID, LEVOTHROID) 75 MCG tablet Take 75 mcg by mouth daily before breakfast.    . NONFORMULARY OR COMPOUNDED ITEM Estradiol 0.02% vaginal cream twice weekly 90 each 4  . pravastatin (PRAVACHOL) 20 MG tablet Take 1 tablet (20 mg total) by mouth daily. 30 tablet 3  . Probiotic Product (PROBIOTIC COMPLEX  ACIDOPHILUS PO) Take 15 mLs by mouth daily. Reported on 05/28/2015    . progesterone (PROMETRIUM) 100 MG capsule Take 1 capsule (100 mg total) by mouth at bedtime. 90 capsule 1   No current facility-administered medications on file prior to visit.    Allergies  Allergen Reactions  . Statins Other (See Comments)    Body cramps all over body   Social History   Social History  . Marital status: Married    Spouse name: N/A  . Number of children: N/A  . Years of education: N/A   Occupational History  . Not on file.   Social History Main Topics  . Smoking status: Former Smoker    Types: Cigarettes    Quit date: 05/27/1991  . Smokeless tobacco: Never Used  . Alcohol use 0.0 oz/week     Comment: rare  . Drug use: No  . Sexual activity: Yes    Birth control/ protection: Post-menopausal     Comment: 1st intercourse 70 yo--Fewer than 5 partners   Other Topics Concern  . Not on file   Social History Narrative  . No narrative on file      Review of Systems  All other systems reviewed and are negative.      Objective:   Physical Exam  Cardiovascular: Normal rate, regular rhythm and normal heart sounds.   Pulmonary/Chest: Effort normal and breath sounds normal.  Skin: Rash noted. There is erythema.  Vitals reviewed.         Assessment & Plan:  Irritant dermatitis - Plan: mometasone (ELOCON) 0.1 % ointment, DISCONTINUED: mometasone (ELOCON) 0.1 % ointment  This appears to be some type of irritant dermatitis versus scabies. It certainly does not appear to be autoimmune. I recommended Elocon ointment once daily for 7-10 days. If rash worsens or persist, consider empiric treatment for scabies.

## 2016-04-19 ENCOUNTER — Ambulatory Visit (INDEPENDENT_AMBULATORY_CARE_PROVIDER_SITE_OTHER): Payer: Medicare Other | Admitting: Family Medicine

## 2016-04-19 ENCOUNTER — Encounter: Payer: Self-pay | Admitting: Family Medicine

## 2016-04-19 VITALS — BP 100/60 | HR 58 | Temp 98.3°F | Resp 16 | Ht 64.0 in | Wt 124.0 lb

## 2016-04-19 DIAGNOSIS — R05 Cough: Secondary | ICD-10-CM

## 2016-04-19 DIAGNOSIS — R053 Chronic cough: Secondary | ICD-10-CM

## 2016-04-19 NOTE — Progress Notes (Signed)
Subjective:    Patient ID: Annette Barker, female    DOB: 07-23-1945, 70 y.o.   MRN: [id number]  HPI1/5/17 Here to re establish care.   Last DEXA was 11/16 and diagnosed her with osteoporosis.  Last colonoscopy was 2012.  Past medical history is significant for hypothyroidism, osteoporosis, hyperlipidemia. She is currently taking vaginal estrogen for vaginal dryness. She is on no hormone replacement therapy however she is going to discuss this with her gynecologist as she has had a noticeably decreased libido since discontinuing hormone replacement therapy. Patient states she has had Pneumovax 23. She receives the flu shot today. She is hesitant to receive  Prevnar 13 after she had an adverse reaction to Pneumovax 23. Her gynecologist performs her Pap smears and her pelvic. She is overdue for fasting lab work.  At that time, my plan was:  I would like the patient to return fasting for a CBC, CMP, fasting lipid panel, and a TSH. I recommended 1200 mg a day of calcium. I recommended 1000 units a day of vitamin D. Her Pap smear and her pelvic exam performed by her gynecologist. She will discuss resuming hormone replacement therapy with her gynecologist. I would recommend Fosamax for osteoporosis especially if she does not resume hormone replacement therapy. I would like to check a fasting lipid panel and treat her LDL cholesterol is significantly elevated. She received her flu shot today. I recommended returning her convenience for Prevnar 13. Colonoscopy is up-to-date.  08/06/15 Patient has had a constant cough since January. It is nonproductive. She denies any chest pain or pleurisy or hemoptysis or shortness of breath. She denies any fever or weight loss. She does have constant postnasal drip, frontal sinus pain and pressure, sinus headache. It sounds as though the drainage is causing her cough. She is also had pain in the dorsum of her left wrist for several weeks. She has a positive Finkelstein test.  Pain is worse with wrist flexion.  Patient has seen an orthopedist and an x-ray was normal.  AT that time, my plan was: She denies any heartburn. I will obtain a chest x-ray given the chronic cough but I suspect constant postnasal drip from a sinus infection. Begin a prednisone taper pack along with Omnicef. I believe she also has tendinitis in her left wrist. I recommended 2 weeks of immobilization with a thumb spica splint and then recheck if no better  04/19/16 Patient states that her husband made her make this appointment. Apparently her cough is never completely went away. She continues to have an irritant upper airway cough on a daily basis. It is worse at night when she lies down. It is nonproductive. She describes it as a tickle in her throat. She denies any postnasal drip or drainage or sinus pain. She denies any sneezing or itchy watery eyes. She denies any headache or fever. She denies any wheezing or history of asthma Past Medical History:  Diagnosis Date  . Anxiety   . Arthritis    in her hands   . Elevated cholesterol   . Hair loss   . Hemorrhoids   . Hypothyroidism    on synthroid  . IBS (irritable bowel syndrome)   . Osteoporosis 03/2015   T score -2.8  . Thyroid disease    hypothyroid   Past Surgical History:  Procedure Laterality Date  . LAPAROSCOPIC SALPINGO OOPHERECTOMY Bilateral 10/29/2014   Procedure: LAPAROSCOPIC SALPINGO OOPHORECTOMY;  Surgeon: [name], MD;  Location: [facility];  Service:  Gynecology;  Laterality: Bilateral;  1:00pm OR time requested  1 1/2 hours OR time requested.  Marland Kitchen SHOULDER SURGERY    . TUBAL LIGATION     Current Outpatient Prescriptions on File Prior to Visit  Medication Sig Dispense Refill  . desonide (DESOWEN) 0.05 % cream Apply 1 application topically 2 (two) times daily as needed (rash). Reported on 06/16/2015    . diazepam (VALIUM) 5 MG tablet TAKE 1/4 TABLET BY MOUTH EVERY NIGHT AT BEDTIME 30 tablet 0  . levothyroxine  (SYNTHROID, LEVOTHROID) 75 MCG tablet Take 75 mcg by mouth daily before breakfast.    . mometasone (ELOCON) 0.1 % ointment Apply topically daily. 45 g 0  . NONFORMULARY OR COMPOUNDED ITEM Estradiol 0.02% vaginal cream twice weekly 90 each 4  . Probiotic Product (PROBIOTIC COMPLEX ACIDOPHILUS PO) Take 15 mLs by mouth daily. Reported on 05/28/2015    . progesterone (PROMETRIUM) 100 MG capsule Take 1 capsule (100 mg total) by mouth at bedtime. 90 capsule 1  . diphenhydrAMINE (BENADRYL) 12.5 MG chewable tablet Chew 12.5 mg by mouth at bedtime.    . pravastatin (PRAVACHOL) 20 MG tablet Take 1 tablet (20 mg total) by mouth daily. (Patient not taking: Reported on 04/19/2016) 30 tablet 3   No current facility-administered medications on file prior to visit.    Allergies  Allergen Reactions  . Statins Other (See Comments)    Body cramps all over body   Social History   Social History  . Marital status: Married    Spouse name: N/A  . Number of children: N/A  . Years of education: N/A   Occupational History  . Not on file.   Social History Main Topics  . Smoking status: Former Smoker    Types: Cigarettes    Quit date: 05/27/1991  . Smokeless tobacco: Never Used  . Alcohol use 0.0 oz/week     Comment: rare  . Drug use: No  . Sexual activity: Yes    Birth control/ protection: Post-menopausal     Comment: 1st intercourse 70 yo--Fewer than 5 partners   Other Topics Concern  . Not on file   Social History Narrative  . No narrative on file   Family History  Problem Relation Age of Onset  . Alzheimer's disease Mother   . Arthritis Mother   . Osteoporosis Father   . Cancer Father     leukemia  . Arthritis Father   . Hyperlipidemia Father   . Cancer Sister     PERITONEAL CANCER  . Arthritis Sister   . Colitis Son   . Heart disease Sister   . Arthritis Sister   . Alcohol abuse Sister     Review of Systems  Respiratory: Positive for cough.        Objective:   Physical Exam    Constitutional: She appears well-developed and well-nourished. No distress.  HENT:  Head: Normocephalic and atraumatic.  Right Ear: External ear normal.  Left Ear: External ear normal.  Nose: Nose normal.  Mouth/Throat: Oropharynx is clear and moist.  Cardiovascular: Normal rate, regular rhythm, normal heart sounds and intact distal pulses.  Exam reveals no gallop and no friction rub.   No murmur heard. Pulmonary/Chest: Effort normal and breath sounds normal. No respiratory distress. She has no wheezes. She has no rales. She exhibits no tenderness.  Skin: She is not diaphoretic.  Vitals reviewed.         Assessment & Plan:  Upper airway cough syndrome. Begin Prilosec 40 mg  by mouth daily and recheck in 3-4 weeks. Consider allergy testing versus pulmonary function tests if cough is not better at that time

## 2016-04-28 ENCOUNTER — Other Ambulatory Visit: Payer: Self-pay | Admitting: Gynecology

## 2016-04-28 NOTE — Telephone Encounter (Signed)
Called into pharmacy

## 2016-05-02 ENCOUNTER — Telehealth: Payer: Self-pay | Admitting: Family Medicine

## 2016-05-02 NOTE — Telephone Encounter (Signed)
LMTRC

## 2016-05-02 NOTE — Telephone Encounter (Signed)
Patient calling to speak to you regarding warning signs of mrsa and what to look for with [facility]  [phone]

## 2016-05-05 NOTE — Telephone Encounter (Signed)
Questions answered.

## 2016-06-21 ENCOUNTER — Encounter: Payer: Self-pay | Admitting: Gynecology

## 2016-06-21 ENCOUNTER — Ambulatory Visit (INDEPENDENT_AMBULATORY_CARE_PROVIDER_SITE_OTHER): Payer: Medicare Other | Admitting: Gynecology

## 2016-06-21 VITALS — BP 118/76 | Ht 64.0 in | Wt 125.0 lb

## 2016-06-21 DIAGNOSIS — Z01411 Encounter for gynecological examination (general) (routine) with abnormal findings: Secondary | ICD-10-CM

## 2016-06-21 DIAGNOSIS — N952 Postmenopausal atrophic vaginitis: Secondary | ICD-10-CM

## 2016-06-21 DIAGNOSIS — M810 Age-related osteoporosis without current pathological fracture: Secondary | ICD-10-CM

## 2016-06-21 NOTE — Progress Notes (Signed)
    KAZLYN KARAU [birth year] [id number]        71 y.o.  [id number] for breast and pelvic exam  Past medical history,surgical history, problem list, medications, allergies, family history and social history were all reviewed and documented as reviewed in the EPIC chart.  ROS:  Performed with pertinent positives and negatives included in the history, assessment and plan.   Additional significant findings :  None   Exam: [name] assistant Vitals:   06/21/16 1400  BP: 118/76  Weight: 125 lb (56.7 kg)  Height: 5\' 4"  (1.626 m)   Body mass index is 21.46 kg/m.  General appearance:  Normal affect, orientation and appearance. Skin: Grossly normal HEENT: Without gross lesions.  No cervical or supraclavicular adenopathy. Thyroid normal.  Lungs:  Clear without wheezing, rales or rhonchi Cardiac: RR, without RMG Abdominal:  Soft, nontender, without masses, guarding, rebound, organomegaly or hernia Breasts:  Examined lying and sitting without masses, retractions, discharge or axillary adenopathy. Pelvic:  Ext, BUS, Vagina with atrophic changes  Cervix with atrophic changes  Uterus anteverted, normal size, shape and contour, midline and mobile nontender   Adnexa without masses or tenderness    Anus and perineum normal   Rectovaginal normal sphincter tone without palpated masses or tenderness.    Assessment/Plan:  71 y.o. [id number] female for breast and pelvic exam.   1. Postmenopausal/atrophic genital changes. Patient continuing on estrogen gel daily and Prometrium 100 mg at bedtime. Is doing well with no hot flushes, night sweats, vaginal dryness or any vaginal bleeding. I reviewed the most current 2017 NAMS guidelines on HRT. Risks to include stroke heart attack DVT and breast cancer reviewed. Benefits to include symptom relief impossible cardiovascular/bone health and started early also discussed. Patient prefers to continue on her regimen for now I refilled her 1 year. 2. Mammography  01/2013. I again reminded the patient she is overdue. Benefits of early detection and the most common cancer in women reviewed. Patient acknowledges my recommendation to schedule her mammogram. SBE monthly reviewed. 3. Osteoporosis. DEXA 03/2015 T score -2.8. Showed loss from her prior study. Reinitiating HRT last year. Will schedule follow up DEXA end of this year and then go from there. Possible need for additional treatment reviewed. 4. Pap smear 2016. No Pap smear done today. No history of significant abnormal Pap smears. Reviewed current screening guidelines and options to stop screening based on age versus less frequent screening intervals reviewed. Will readdress on annual basis. 5. Colonoscopy 2017. Repeat at their recommended interval. 6. Health maintenance. No routine lab work done as she reports this done elsewhere. Follow up 1 year, sooner as needed.   [name] MD, 2:36 PM 06/21/2016

## 2016-06-21 NOTE — Patient Instructions (Signed)
Call to schedule your bone density at the end of this year.  Call to Schedule your mammogram  Facilities in [place]: 1)  [facility]. [address] [place] Phone: [phone] 2)  Dr. Isaiah Blakes at [facility] [address] Phone: [phone]     Mammogram A mammogram is an X-ray test to find changes in a woman's breast. You should get a mammogram if:  You are 69 years of age or older  You have risk factors.   Your doctor recommends that you have one.  BEFORE THE TEST  Do not schedule the test the week before your period, especially if your breasts are sore during this time.  On the day of your mammogram:  Wash your breasts and armpits well. After washing, do not put on any deodorant or talcum powder on until after your test.   Eat and drink as you usually do.   Take your medicines as usual.   If you are diabetic and take insulin, make sure you:   Eat before coming for your test.   Take your insulin as usual.   If you cannot keep your appointment, call before the appointment to cancel. Schedule another appointment.  TEST  You will need to undress from the waist up. You will put on a hospital gown.   Your breast will be put on the mammogram machine, and it will press firmly on your breast with a piece of plastic called a compression paddle. This will make your breast flatter so that the machine can X-ray all parts of your breast.   Both breasts will be X-rayed. Each breast will be X-rayed from above and from the side. An X-ray might need to be taken again if the picture is not good enough.   The mammogram will last about 15 to 30 minutes.  AFTER THE TEST Finding out the results of your test Ask when your test results will be ready. Make sure you get your test results.  Document Released: 08/05/2008 Document Revised: 04/28/2011 Document Reviewed: 08/05/2008 [facility] Patient Information 2012  [place].

## 2016-06-28 DIAGNOSIS — M545 Low back pain: Secondary | ICD-10-CM | POA: Diagnosis not present

## 2016-06-28 DIAGNOSIS — M25511 Pain in right shoulder: Secondary | ICD-10-CM | POA: Diagnosis not present

## 2016-06-30 ENCOUNTER — Encounter: Payer: Self-pay | Admitting: Family Medicine

## 2016-06-30 ENCOUNTER — Ambulatory Visit (INDEPENDENT_AMBULATORY_CARE_PROVIDER_SITE_OTHER): Payer: Medicare Other | Admitting: Family Medicine

## 2016-06-30 VITALS — BP 130/64 | HR 68 | Temp 98.0°F | Resp 18 | Ht 64.0 in | Wt 126.0 lb

## 2016-06-30 DIAGNOSIS — K589 Irritable bowel syndrome without diarrhea: Secondary | ICD-10-CM | POA: Diagnosis not present

## 2016-06-30 DIAGNOSIS — R5382 Chronic fatigue, unspecified: Secondary | ICD-10-CM

## 2016-06-30 DIAGNOSIS — R197 Diarrhea, unspecified: Secondary | ICD-10-CM | POA: Diagnosis not present

## 2016-06-30 LAB — COMPLETE METABOLIC PANEL WITH GFR
ALT: 7 U/L (ref 6–29)
AST: 13 U/L (ref 10–35)
Albumin: 4 g/dL (ref 3.6–5.1)
Alkaline Phosphatase: 90 U/L (ref 33–130)
BILIRUBIN TOTAL: 0.3 mg/dL (ref 0.2–1.2)
BUN: 17 mg/dL (ref 7–25)
CO2: 25 mmol/L (ref 20–31)
CREATININE: 0.81 mg/dL (ref 0.50–0.99)
Calcium: 9.3 mg/dL (ref 8.6–10.4)
Chloride: 102 mmol/L (ref 98–110)
GFR, EST AFRICAN AMERICAN: 86 mL/min (ref >=60)
GFR, Est Non African American: 75 mL/min (ref >=60)
GLUCOSE: 83 mg/dL (ref 70–99)
Potassium: 4.5 mmol/L (ref 3.5–5.3)
Sodium: 138 mmol/L (ref 135–146)
Total Protein: 7.3 g/dL (ref 6.1–8.1)

## 2016-06-30 LAB — CBC WITH DIFFERENTIAL/PLATELET
BASOS PCT: 0 %
Basophils Absolute: 0 cells/uL (ref 0–200)
EOS ABS: 0 {cells}/uL — AB (ref 15–500)
Eosinophils Relative: 0 %
HCT: 35.5 % (ref 35.0–45.0)
Hemoglobin: 11.5 g/dL — ABNORMAL LOW (ref 12.0–15.0)
LYMPHS ABS: 1500 {cells}/uL (ref 850–3900)
Lymphocytes Relative: 20 %
MCH: 31.7 pg (ref 27.0–33.0)
MCHC: 32.4 g/dL (ref 32.0–36.0)
MCV: 97.8 fL (ref 80.0–100.0)
MONO ABS: 375 {cells}/uL (ref 200–950)
MONOS PCT: 5 %
MPV: 9.1 fL (ref 7.5–12.5)
NEUTROS ABS: 5625 {cells}/uL (ref 1500–7800)
Neutrophils Relative %: 75 %
PLATELETS: 360 10*3/uL (ref 140–400)
RBC: 3.63 MIL/uL — ABNORMAL LOW (ref 3.80–5.10)
RDW: 13.8 % (ref 11.0–15.0)
WBC: 7.5 10*3/uL (ref 3.8–10.8)

## 2016-06-30 LAB — TSH: TSH: 0.89 mIU/L

## 2016-06-30 LAB — IRON: Iron: 69 ug/dL (ref 45–160)

## 2016-06-30 LAB — VITAMIN B12: Vitamin B-12: 371 pg/mL (ref 200–1100)

## 2016-06-30 NOTE — Progress Notes (Signed)
Subjective:    Patient ID: Annette Barker, female    DOB: 1945/11/13, 71 y.o.   MRN: [id number]  HPI Patient presents with several months of worsening fatigue. She is accompanied by her husband. They're both very concerned. She has no energy. She wants to go and do but she has very little stamina. Symptoms began after the fall when she was bitten by 2 tics. Therefore the concern about Lyme disease. She denies any chest pain, shortness of breath, dyspnea on exertion, orthopnea. She denies any fevers or night sweats or bruising. She denies any blood in her stool or hematochezia. She denies any nausea vomiting. She does have chronic diarrhea but she's been diagnosed with irritable bowel syndrome. She denies any weight loss. She is complaining of left shoulder pain and lower back pain however she just saw an orthopedist yesterday was placed on prednisone. X-rays were obtained of the lower back and the shoulder and showed no abnormalities. She denies any rash or bone pain anywhere else in the body. Past Medical History:  Diagnosis Date  . Anxiety   . Arthritis    in her hands   . Elevated cholesterol   . Hair loss   . Hemorrhoids   . Hypothyroidism    on synthroid  . IBS (irritable bowel syndrome)   . Osteoporosis 03/2015   T score -2.8  . Thyroid disease    hypothyroid   Past Surgical History:  Procedure Laterality Date  . LAPAROSCOPIC SALPINGO OOPHERECTOMY Bilateral 10/29/2014   Procedure: LAPAROSCOPIC SALPINGO OOPHORECTOMY;  Surgeon: [name], MD;  Location: [facility];  Service: Gynecology;  Laterality: Bilateral;  1:00pm OR time requested  1 1/2 hours OR time requested.  Marland Kitchen SHOULDER SURGERY    . TUBAL LIGATION     Current Outpatient Prescriptions on File Prior to Visit  Medication Sig Dispense Refill  . diazepam (VALIUM) 5 MG tablet TAKE 1 TABLET BY MOUTH EVERY DAY AT BEDTIME 30 tablet 0  . diphenhydrAMINE (BENADRYL) 12.5 MG chewable tablet Chew 12.5 mg by mouth at bedtime.      . Estradiol (ESTROGEL) 0.75 MG/1.25 GM (0.06%) topical gel Place 1.25 g onto the skin daily.    Marland Kitchen levothyroxine (SYNTHROID, LEVOTHROID) 75 MCG tablet Take 75 mcg by mouth daily before breakfast.    . mometasone (ELOCON) 0.1 % ointment Apply topically daily. 45 g 0  . Probiotic Product (PROBIOTIC COMPLEX ACIDOPHILUS PO) Take 15 mLs by mouth daily. Reported on 05/28/2015    . progesterone (PROMETRIUM) 100 MG capsule Take 1 capsule (100 mg total) by mouth at bedtime. 90 capsule 1  . NONFORMULARY OR COMPOUNDED ITEM Estradiol 0.02% vaginal cream twice weekly (Patient not taking: Reported on 06/21/2016) 90 each 4  . pravastatin (PRAVACHOL) 20 MG tablet Take 1 tablet (20 mg total) by mouth daily. (Patient not taking: Reported on 04/19/2016) 30 tablet 3   No current facility-administered medications on file prior to visit.    Allergies  Allergen Reactions  . Statins Other (See Comments)    Body cramps all over body   Social History   Social History  . Marital status: Married    Spouse name: N/A  . Number of children: N/A  . Years of education: N/A   Occupational History  . Not on file.   Social History Main Topics  . Smoking status: Former Smoker    Types: Cigarettes    Quit date: 05/27/1991  . Smokeless tobacco: Never Used  . Alcohol use 0.0 oz/week  Comment: rare  . Drug use: No  . Sexual activity: Yes    Birth control/ protection: Post-menopausal     Comment: 1st intercourse 71 yo--Fewer than 5 partners   Other Topics Concern  . Not on file   Social History Narrative  . No narrative on file       Review of Systems  All other systems reviewed and are negative.      Objective:   Physical Exam  Constitutional: She appears well-developed and well-nourished.  HENT:  Right Ear: External ear normal.  Left Ear: External ear normal.  Nose: Nose normal.  Mouth/Throat: Oropharynx is clear and moist. No oropharyngeal exudate.  Eyes: Conjunctivae and EOM are normal. Pupils  are equal, round, and reactive to light. Right eye exhibits no discharge. Left eye exhibits no discharge. No scleral icterus.  Neck: Neck supple. No JVD present. No tracheal deviation present. No thyromegaly present.  Cardiovascular: Normal rate, regular rhythm and normal heart sounds.  Exam reveals no gallop and no friction rub.   No murmur heard. Pulmonary/Chest: Effort normal and breath sounds normal. No respiratory distress. She has no wheezes. She has no rales. She exhibits no tenderness.  Abdominal: Soft. Bowel sounds are normal. She exhibits no distension. There is no tenderness. There is no rebound and no guarding.  Musculoskeletal: She exhibits no edema.  Lymphadenopathy:    She has no cervical adenopathy.  Skin: Skin is warm. No rash noted. No erythema. No pallor.  Psychiatric: She has a normal mood and affect. Her behavior is normal. Judgment and thought content normal.  Vitals reviewed.         Assessment & Plan:  Chronic fatigue - Plan: CBC with Differential/Platelet, COMPLETE METABOLIC PANEL WITH GFR, TSH, B. burgdorfi antibodies by WB, Sedimentation rate, Vitamin B12, Iron, Fecal occult blood, imunochemical, Fecal occult blood, imunochemical, Fecal occult blood, imunochemical Review of systems and exam is reassuring. I can find no source for the patient's fatigue. I will check a battery of blood tests given the patient's significant concern. I will check a CBC, CMP, TSH, vitamin B12, and iron panel. Given her diarrhea, I will check for fecal occult blood 3 to evaluate for inflammatory bowel disease such as Crohn's. Also check a sedimentation rate. I will also check Lyme titers. Await the results of blood tests and stool tests

## 2016-07-01 LAB — SEDIMENTATION RATE: SED RATE: 19 mm/h (ref 0–30)

## 2016-07-05 LAB — LYME ABY, WSTRN BLT IGG & IGM W/BANDS
B burgdorferi IgG Abs (IB): NEGATIVE
B burgdorferi IgM Abs (IB): NEGATIVE
LYME DISEASE 23 KD IGM: NONREACTIVE
LYME DISEASE 39 KD IGM: NONREACTIVE
LYME DISEASE 41 KD IGG: NONREACTIVE
LYME DISEASE 45 KD IGG: NONREACTIVE
LYME DISEASE 58 KD IGG: NONREACTIVE
LYME DISEASE 66 KD IGG: NONREACTIVE
LYME DISEASE 93 KD IGG: NONREACTIVE
Lyme Disease 18 kD IgG: NONREACTIVE
Lyme Disease 23 kD IgG: NONREACTIVE
Lyme Disease 28 kD IgG: NONREACTIVE
Lyme Disease 30 kD IgG: NONREACTIVE
Lyme Disease 39 kD IgG: NONREACTIVE
Lyme Disease 41 kD IgM: NONREACTIVE

## 2016-07-07 DIAGNOSIS — M25511 Pain in right shoulder: Secondary | ICD-10-CM | POA: Diagnosis not present

## 2016-07-18 ENCOUNTER — Telehealth: Payer: Self-pay | Admitting: Family Medicine

## 2016-07-18 ENCOUNTER — Other Ambulatory Visit: Payer: Medicare Other

## 2016-07-18 DIAGNOSIS — R5382 Chronic fatigue, unspecified: Secondary | ICD-10-CM | POA: Diagnosis not present

## 2016-07-18 DIAGNOSIS — K921 Melena: Secondary | ICD-10-CM

## 2016-07-18 NOTE — Telephone Encounter (Signed)
Called.  She is following up with ortho probably for MRI.  Will await the results.

## 2016-07-18 NOTE — Telephone Encounter (Signed)
Patient would like to speak to only dr pickard about her lab results

## 2016-07-19 LAB — FECAL OCCULT BLOOD, IMMUNOCHEMICAL
FECAL OCCULT BLOOD: NEGATIVE
Fecal Occult Blood: NEGATIVE
Fecal Occult Blood: NEGATIVE

## 2016-07-25 DIAGNOSIS — M545 Low back pain: Secondary | ICD-10-CM | POA: Diagnosis not present

## 2016-07-25 DIAGNOSIS — M25512 Pain in left shoulder: Secondary | ICD-10-CM | POA: Diagnosis not present

## 2016-07-29 DIAGNOSIS — M25512 Pain in left shoulder: Secondary | ICD-10-CM | POA: Diagnosis not present

## 2016-08-01 ENCOUNTER — Telehealth: Payer: Self-pay | Admitting: Family Medicine

## 2016-08-01 MED ORDER — LEVOTHYROXINE SODIUM 75 MCG PO TABS: 75.0000 ug | ORAL_TABLET | Freq: Every day | ORAL | 3 refills | Status: DC

## 2016-08-01 NOTE — Telephone Encounter (Signed)
Patient needs her levothyroxine she would like this called into [facility] at [facility]. Pt is completely out  CB# [phone]

## 2016-08-01 NOTE — Telephone Encounter (Signed)
Medication called/sent to requested pharmacy  

## 2016-08-02 DIAGNOSIS — M545 Low back pain: Secondary | ICD-10-CM | POA: Diagnosis not present

## 2016-08-02 DIAGNOSIS — M5136 Other intervertebral disc degeneration, lumbar region: Secondary | ICD-10-CM | POA: Diagnosis not present

## 2016-08-24 DIAGNOSIS — M545 Low back pain: Secondary | ICD-10-CM | POA: Diagnosis not present

## 2016-08-24 DIAGNOSIS — M5136 Other intervertebral disc degeneration, lumbar region: Secondary | ICD-10-CM | POA: Diagnosis not present

## 2016-08-31 ENCOUNTER — Other Ambulatory Visit: Payer: Self-pay

## 2016-08-31 DIAGNOSIS — M5416 Radiculopathy, lumbar region: Secondary | ICD-10-CM | POA: Diagnosis not present

## 2016-08-31 MED ORDER — DIAZEPAM 5 MG PO TABS: 5.0000 mg | ORAL_TABLET | Freq: Every day | ORAL | 1 refills | Status: DC

## 2016-08-31 NOTE — Telephone Encounter (Signed)
Called requesting a refill on Diazepam.

## 2016-08-31 NOTE — Telephone Encounter (Signed)
Left message to call.

## 2016-08-31 NOTE — Telephone Encounter (Signed)
Okay for #30 with 1 refill

## 2016-08-31 NOTE — Telephone Encounter (Signed)
I do not see where it is clearly documented her indications for the Valium. Can check with the patient and let me know.

## 2016-08-31 NOTE — Telephone Encounter (Signed)
Patient said that original Rx was from her PCP Dr. Deatra Ina years ago. He prescribed it because she grinds her teeth at night and even the mouth guard would not help she grinded it too.  She said the dentist said she would have to have oral surgery if she could not prevent it. Dr. Deatra Ina recommended a low dose Diazepam as needed at hs. Patient said some nights she is so tired she does not need it. She said she thinks she last filled it last Dec so she does not think she is overusing it.  She said Dr. Cherylann Banas had continued to fill it for her and most recently you have been doing it for her.

## 2016-08-31 NOTE — Telephone Encounter (Signed)
Rx sent. Patient informed. 

## 2016-09-19 DIAGNOSIS — M7542 Impingement syndrome of left shoulder: Secondary | ICD-10-CM | POA: Diagnosis not present

## 2016-09-19 DIAGNOSIS — S46012A Strain of muscle(s) and tendon(s) of the rotator cuff of left shoulder, initial encounter: Secondary | ICD-10-CM | POA: Diagnosis not present

## 2016-09-19 DIAGNOSIS — M24012 Loose body in left shoulder: Secondary | ICD-10-CM | POA: Diagnosis not present

## 2016-09-19 DIAGNOSIS — G8918 Other acute postprocedural pain: Secondary | ICD-10-CM | POA: Diagnosis not present

## 2016-09-19 DIAGNOSIS — M75122 Complete rotator cuff tear or rupture of left shoulder, not specified as traumatic: Secondary | ICD-10-CM | POA: Diagnosis not present

## 2016-09-19 DIAGNOSIS — M24112 Other articular cartilage disorders, left shoulder: Secondary | ICD-10-CM | POA: Diagnosis not present

## 2016-09-19 DIAGNOSIS — M948X1 Other specified disorders of cartilage, shoulder: Secondary | ICD-10-CM | POA: Diagnosis not present

## 2016-09-19 DIAGNOSIS — M19012 Primary osteoarthritis, left shoulder: Secondary | ICD-10-CM | POA: Diagnosis not present

## 2016-09-27 DIAGNOSIS — M19012 Primary osteoarthritis, left shoulder: Secondary | ICD-10-CM | POA: Diagnosis not present

## 2016-09-29 DIAGNOSIS — R531 Weakness: Secondary | ICD-10-CM | POA: Diagnosis not present

## 2016-09-29 DIAGNOSIS — M25612 Stiffness of left shoulder, not elsewhere classified: Secondary | ICD-10-CM | POA: Diagnosis not present

## 2016-09-29 DIAGNOSIS — M25512 Pain in left shoulder: Secondary | ICD-10-CM | POA: Diagnosis not present

## 2016-09-29 DIAGNOSIS — M75102 Unspecified rotator cuff tear or rupture of left shoulder, not specified as traumatic: Secondary | ICD-10-CM | POA: Diagnosis not present

## 2016-10-03 DIAGNOSIS — R531 Weakness: Secondary | ICD-10-CM | POA: Diagnosis not present

## 2016-10-03 DIAGNOSIS — M25612 Stiffness of left shoulder, not elsewhere classified: Secondary | ICD-10-CM | POA: Diagnosis not present

## 2016-10-03 DIAGNOSIS — M25512 Pain in left shoulder: Secondary | ICD-10-CM | POA: Diagnosis not present

## 2016-10-03 DIAGNOSIS — M75102 Unspecified rotator cuff tear or rupture of left shoulder, not specified as traumatic: Secondary | ICD-10-CM | POA: Diagnosis not present

## 2016-10-05 DIAGNOSIS — M75102 Unspecified rotator cuff tear or rupture of left shoulder, not specified as traumatic: Secondary | ICD-10-CM | POA: Diagnosis not present

## 2016-10-05 DIAGNOSIS — M25612 Stiffness of left shoulder, not elsewhere classified: Secondary | ICD-10-CM | POA: Diagnosis not present

## 2016-10-05 DIAGNOSIS — R531 Weakness: Secondary | ICD-10-CM | POA: Diagnosis not present

## 2016-10-05 DIAGNOSIS — M25512 Pain in left shoulder: Secondary | ICD-10-CM | POA: Diagnosis not present

## 2016-10-11 ENCOUNTER — Encounter: Payer: Self-pay | Admitting: Family Medicine

## 2016-10-11 DIAGNOSIS — R531 Weakness: Secondary | ICD-10-CM | POA: Diagnosis not present

## 2016-10-11 DIAGNOSIS — M25512 Pain in left shoulder: Secondary | ICD-10-CM | POA: Diagnosis not present

## 2016-10-11 DIAGNOSIS — M25612 Stiffness of left shoulder, not elsewhere classified: Secondary | ICD-10-CM | POA: Diagnosis not present

## 2016-10-11 DIAGNOSIS — M75102 Unspecified rotator cuff tear or rupture of left shoulder, not specified as traumatic: Secondary | ICD-10-CM | POA: Diagnosis not present

## 2016-10-13 DIAGNOSIS — M25612 Stiffness of left shoulder, not elsewhere classified: Secondary | ICD-10-CM | POA: Diagnosis not present

## 2016-10-13 DIAGNOSIS — M25512 Pain in left shoulder: Secondary | ICD-10-CM | POA: Diagnosis not present

## 2016-10-13 DIAGNOSIS — M75102 Unspecified rotator cuff tear or rupture of left shoulder, not specified as traumatic: Secondary | ICD-10-CM | POA: Diagnosis not present

## 2016-10-13 DIAGNOSIS — R531 Weakness: Secondary | ICD-10-CM | POA: Diagnosis not present

## 2016-10-14 DIAGNOSIS — M7541 Impingement syndrome of right shoulder: Secondary | ICD-10-CM | POA: Diagnosis not present

## 2016-10-14 DIAGNOSIS — M2242 Chondromalacia patellae, left knee: Secondary | ICD-10-CM | POA: Diagnosis not present

## 2016-10-14 DIAGNOSIS — M2241 Chondromalacia patellae, right knee: Secondary | ICD-10-CM | POA: Diagnosis not present

## 2016-10-18 DIAGNOSIS — M75102 Unspecified rotator cuff tear or rupture of left shoulder, not specified as traumatic: Secondary | ICD-10-CM | POA: Diagnosis not present

## 2016-10-18 DIAGNOSIS — M25512 Pain in left shoulder: Secondary | ICD-10-CM | POA: Diagnosis not present

## 2016-10-18 DIAGNOSIS — M25612 Stiffness of left shoulder, not elsewhere classified: Secondary | ICD-10-CM | POA: Diagnosis not present

## 2016-10-18 DIAGNOSIS — R531 Weakness: Secondary | ICD-10-CM | POA: Diagnosis not present

## 2016-10-24 DIAGNOSIS — M75102 Unspecified rotator cuff tear or rupture of left shoulder, not specified as traumatic: Secondary | ICD-10-CM | POA: Diagnosis not present

## 2016-10-24 DIAGNOSIS — M25512 Pain in left shoulder: Secondary | ICD-10-CM | POA: Diagnosis not present

## 2016-10-24 DIAGNOSIS — R531 Weakness: Secondary | ICD-10-CM | POA: Diagnosis not present

## 2016-10-24 DIAGNOSIS — M25612 Stiffness of left shoulder, not elsewhere classified: Secondary | ICD-10-CM | POA: Diagnosis not present

## 2016-10-26 DIAGNOSIS — M75102 Unspecified rotator cuff tear or rupture of left shoulder, not specified as traumatic: Secondary | ICD-10-CM | POA: Diagnosis not present

## 2016-10-26 DIAGNOSIS — R531 Weakness: Secondary | ICD-10-CM | POA: Diagnosis not present

## 2016-10-26 DIAGNOSIS — M25612 Stiffness of left shoulder, not elsewhere classified: Secondary | ICD-10-CM | POA: Diagnosis not present

## 2016-10-26 DIAGNOSIS — M25512 Pain in left shoulder: Secondary | ICD-10-CM | POA: Diagnosis not present

## 2016-10-27 ENCOUNTER — Other Ambulatory Visit: Payer: Self-pay | Admitting: Gynecology

## 2016-10-28 DIAGNOSIS — M25512 Pain in left shoulder: Secondary | ICD-10-CM | POA: Diagnosis not present

## 2016-11-07 ENCOUNTER — Telehealth: Payer: Self-pay | Admitting: Family Medicine

## 2016-11-07 NOTE — Telephone Encounter (Signed)
Could try K58.9 IBS and R19.7 Diarrhea.

## 2016-11-07 NOTE — Telephone Encounter (Signed)
Patient called in states she has received a bill from [facility] for $497.50 Invoice # [id number]. It is for DOS 06/30/16 the only dx code they have is R53.82 chronic fatigue, unspecified Medicare isn't paying for the Vit B or Iron lab work. Please give me additional codes to add so they can refile to Medicare. Thank you.

## 2016-11-09 DIAGNOSIS — M75102 Unspecified rotator cuff tear or rupture of left shoulder, not specified as traumatic: Secondary | ICD-10-CM | POA: Diagnosis not present

## 2016-11-09 DIAGNOSIS — M25612 Stiffness of left shoulder, not elsewhere classified: Secondary | ICD-10-CM | POA: Diagnosis not present

## 2016-11-09 DIAGNOSIS — M25512 Pain in left shoulder: Secondary | ICD-10-CM | POA: Diagnosis not present

## 2016-11-09 DIAGNOSIS — R531 Weakness: Secondary | ICD-10-CM | POA: Diagnosis not present

## 2016-11-15 DIAGNOSIS — M75102 Unspecified rotator cuff tear or rupture of left shoulder, not specified as traumatic: Secondary | ICD-10-CM | POA: Diagnosis not present

## 2016-11-15 DIAGNOSIS — M25512 Pain in left shoulder: Secondary | ICD-10-CM | POA: Diagnosis not present

## 2016-11-15 DIAGNOSIS — R531 Weakness: Secondary | ICD-10-CM | POA: Diagnosis not present

## 2016-11-15 DIAGNOSIS — M25612 Stiffness of left shoulder, not elsewhere classified: Secondary | ICD-10-CM | POA: Diagnosis not present

## 2016-11-17 DIAGNOSIS — M25612 Stiffness of left shoulder, not elsewhere classified: Secondary | ICD-10-CM | POA: Diagnosis not present

## 2016-11-17 DIAGNOSIS — M75102 Unspecified rotator cuff tear or rupture of left shoulder, not specified as traumatic: Secondary | ICD-10-CM | POA: Diagnosis not present

## 2016-11-17 DIAGNOSIS — R531 Weakness: Secondary | ICD-10-CM | POA: Diagnosis not present

## 2016-11-17 DIAGNOSIS — M25512 Pain in left shoulder: Secondary | ICD-10-CM | POA: Diagnosis not present

## 2016-11-29 DIAGNOSIS — M25612 Stiffness of left shoulder, not elsewhere classified: Secondary | ICD-10-CM | POA: Diagnosis not present

## 2016-11-29 DIAGNOSIS — M25512 Pain in left shoulder: Secondary | ICD-10-CM | POA: Diagnosis not present

## 2016-11-29 DIAGNOSIS — R531 Weakness: Secondary | ICD-10-CM | POA: Diagnosis not present

## 2016-11-29 DIAGNOSIS — M75102 Unspecified rotator cuff tear or rupture of left shoulder, not specified as traumatic: Secondary | ICD-10-CM | POA: Diagnosis not present

## 2016-12-01 DIAGNOSIS — M25512 Pain in left shoulder: Secondary | ICD-10-CM | POA: Diagnosis not present

## 2016-12-01 DIAGNOSIS — M25612 Stiffness of left shoulder, not elsewhere classified: Secondary | ICD-10-CM | POA: Diagnosis not present

## 2016-12-01 DIAGNOSIS — R531 Weakness: Secondary | ICD-10-CM | POA: Diagnosis not present

## 2016-12-01 DIAGNOSIS — M75102 Unspecified rotator cuff tear or rupture of left shoulder, not specified as traumatic: Secondary | ICD-10-CM | POA: Diagnosis not present

## 2016-12-05 NOTE — Telephone Encounter (Signed)
I provided the additional codes to [facility] and she will resubmit to insurance company.

## 2016-12-06 DIAGNOSIS — M25612 Stiffness of left shoulder, not elsewhere classified: Secondary | ICD-10-CM | POA: Diagnosis not present

## 2016-12-06 DIAGNOSIS — M75102 Unspecified rotator cuff tear or rupture of left shoulder, not specified as traumatic: Secondary | ICD-10-CM | POA: Diagnosis not present

## 2016-12-06 DIAGNOSIS — M25512 Pain in left shoulder: Secondary | ICD-10-CM | POA: Diagnosis not present

## 2016-12-06 DIAGNOSIS — R531 Weakness: Secondary | ICD-10-CM | POA: Diagnosis not present

## 2016-12-07 DIAGNOSIS — R531 Weakness: Secondary | ICD-10-CM | POA: Diagnosis not present

## 2016-12-07 DIAGNOSIS — M25612 Stiffness of left shoulder, not elsewhere classified: Secondary | ICD-10-CM | POA: Diagnosis not present

## 2016-12-07 DIAGNOSIS — M75102 Unspecified rotator cuff tear or rupture of left shoulder, not specified as traumatic: Secondary | ICD-10-CM | POA: Diagnosis not present

## 2016-12-07 DIAGNOSIS — M25512 Pain in left shoulder: Secondary | ICD-10-CM | POA: Diagnosis not present

## 2016-12-13 DIAGNOSIS — M25512 Pain in left shoulder: Secondary | ICD-10-CM | POA: Diagnosis not present

## 2016-12-14 DIAGNOSIS — R531 Weakness: Secondary | ICD-10-CM | POA: Diagnosis not present

## 2016-12-14 DIAGNOSIS — M75102 Unspecified rotator cuff tear or rupture of left shoulder, not specified as traumatic: Secondary | ICD-10-CM | POA: Diagnosis not present

## 2016-12-14 DIAGNOSIS — M25512 Pain in left shoulder: Secondary | ICD-10-CM | POA: Diagnosis not present

## 2016-12-14 DIAGNOSIS — M25612 Stiffness of left shoulder, not elsewhere classified: Secondary | ICD-10-CM | POA: Diagnosis not present

## 2016-12-16 DIAGNOSIS — M25612 Stiffness of left shoulder, not elsewhere classified: Secondary | ICD-10-CM | POA: Diagnosis not present

## 2016-12-16 DIAGNOSIS — M25512 Pain in left shoulder: Secondary | ICD-10-CM | POA: Diagnosis not present

## 2016-12-16 DIAGNOSIS — M75102 Unspecified rotator cuff tear or rupture of left shoulder, not specified as traumatic: Secondary | ICD-10-CM | POA: Diagnosis not present

## 2016-12-16 DIAGNOSIS — R531 Weakness: Secondary | ICD-10-CM | POA: Diagnosis not present

## 2016-12-19 ENCOUNTER — Telehealth: Payer: Self-pay | Admitting: *Deleted

## 2016-12-19 MED ORDER — ESTRADIOL 0.75 MG/1.25 GM (0.06%) TD GEL: 1.2500 g | Freq: Every day | TRANSDERMAL | 6 refills | Status: DC

## 2016-12-19 NOTE — Telephone Encounter (Signed)
Pt called requesting estrogel 0.06% faxed to [facility] at [phone] per note on 06/21/16 Rx will be printed and signed and faxed.

## 2016-12-20 ENCOUNTER — Ambulatory Visit (INDEPENDENT_AMBULATORY_CARE_PROVIDER_SITE_OTHER): Payer: Medicare Other | Admitting: Family Medicine

## 2016-12-20 ENCOUNTER — Other Ambulatory Visit: Payer: Self-pay | Admitting: Family Medicine

## 2016-12-20 VITALS — BP 100/60 | HR 76 | Temp 97.8°F | Resp 14 | Ht 64.0 in | Wt 130.0 lb

## 2016-12-20 DIAGNOSIS — K219 Gastro-esophageal reflux disease without esophagitis: Secondary | ICD-10-CM

## 2016-12-20 DIAGNOSIS — M2550 Pain in unspecified joint: Secondary | ICD-10-CM | POA: Diagnosis not present

## 2016-12-20 MED ORDER — OMEPRAZOLE 40 MG PO CPDR: 40.0000 mg | DELAYED_RELEASE_CAPSULE | Freq: Every day | ORAL | 1 refills | Status: DC

## 2016-12-20 NOTE — Addendum Note (Signed)
Addended by: Jenna Luo T on: 12/20/2016 03:12 PM   Modules accepted: Orders

## 2016-12-20 NOTE — Progress Notes (Signed)
Subjective:    Patient ID: Annette Barker, female    DOB: July 01, 1945, 71 y.o.   MRN: [id number]  HPI  06/2016 Patient presents with several months of worsening fatigue. She is accompanied by her husband. They're both very concerned. She has no energy. She wants to go and do but she has very little stamina. Symptoms began after the fall when she was bitten by 2 tics. Therefore the concern about Lyme disease. She denies any chest pain, shortness of breath, dyspnea on exertion, orthopnea. She denies any fevers or night sweats or bruising. She denies any blood in her stool or hematochezia. She denies any nausea vomiting. She does have chronic diarrhea but she's been diagnosed with irritable bowel syndrome. She denies any weight loss. She is complaining of left shoulder pain and lower back pain however she just saw an orthopedist yesterday was placed on prednisone. X-rays were obtained of the lower back and the shoulder and showed no abnormalities. She denies any rash or bone pain anywhere else in the body.  At that time, my plan was: Review of systems and exam is reassuring. I can find no source for the patient's fatigue. I will check a battery of blood tests given the patient's significant concern. I will check a CBC, CMP, TSH, vitamin B12, and iron panel. Given her diarrhea, I will check for fecal occult blood 3 to evaluate for inflammatory bowel disease such as Crohn's. Also check a sedimentation rate. I will also check Lyme titers. Await the results of blood tests and stool tests  12/20/16 Since the patient was last seen, she underwent surgery for torn rotator cuff. She is now complaining of severe pain in her right shoulder. She is also having severe pain in her right wrist primarily over the carpal row adjacent to the [facility] along the dorsal surface of the fifth metacarpal. She also complains of pain in both hips primarily posteriorly. She is also complaining of severe pain in both knees as well as  her ankles. She finds it difficult to even walk. She is seeing orthopedic surgeon has recommended physical therapy and even suggested an antidepressant. She's had x-rays of the right shoulder. She also complains of severe fatigue. She does not endorse any depression, fever, chills, rash, nausea vomiting or diarrhea. Differential diagnosis includes autoimmune diseases versus fibromyalgia. The patient also requested a medical exemption from jury duty. Unfortunately at the present time I do not have a medical diagnosis that would justify exclusion from jury duty. Therefore I declined unless labworks suggest an underlying severe autoimmune illness Past Medical History:  Diagnosis Date  . Anxiety   . Arthritis    in her hands   . Elevated cholesterol   . Hair loss   . Hemorrhoids   . Hypothyroidism    on synthroid  . IBS (irritable bowel syndrome)   . Osteoporosis 03/2015   T score -2.8  . Thyroid disease    hypothyroid   Past Surgical History:  Procedure Laterality Date  . LAPAROSCOPIC SALPINGO OOPHERECTOMY Bilateral 10/29/2014   Procedure: LAPAROSCOPIC SALPINGO OOPHORECTOMY;  Surgeon: [name], MD;  Location: [facility];  Service: Gynecology;  Laterality: Bilateral;  1:00pm OR time requested  1 1/2 hours OR time requested.  Marland Kitchen SHOULDER SURGERY    . TUBAL LIGATION     Current Outpatient Prescriptions on File Prior to Visit  Medication Sig Dispense Refill  . diazepam (VALIUM) 5 MG tablet Take 1 tablet (5 mg total) by mouth at bedtime.  30 tablet 1  . Estradiol (ESTROGEL) 0.75 MG/1.25 GM (0.06%) topical gel Place 1.25 g onto the skin daily. 50 g 6  . levothyroxine (SYNTHROID, LEVOTHROID) 75 MCG tablet Take 1 tablet (75 mcg total) by mouth daily before breakfast. 90 tablet 3  . Probiotic Product (PROBIOTIC COMPLEX ACIDOPHILUS PO) Take 15 mLs by mouth daily. Reported on 05/28/2015    . progesterone (PROMETRIUM) 100 MG capsule Take 1 capsule (100 mg total) by mouth at bedtime. 90 capsule 1    No current facility-administered medications on file prior to visit.    Allergies  Allergen Reactions  . Statins Other (See Comments)    Body cramps all over body   Social History   Social History  . Marital status: Married    Spouse name: N/A  . Number of children: N/A  . Years of education: N/A   Occupational History  . Not on file.   Social History Main Topics  . Smoking status: Former Smoker    Types: Cigarettes    Quit date: 05/27/1991  . Smokeless tobacco: Never Used  . Alcohol use 0.0 oz/week     Comment: rare  . Drug use: No  . Sexual activity: Yes    Birth control/ protection: Post-menopausal     Comment: 1st intercourse 71 yo--Fewer than 5 partners   Other Topics Concern  . Not on file   Social History Narrative  . No narrative on file       Review of Systems  All other systems reviewed and are negative.      Objective:   Physical Exam  Constitutional: She appears well-developed and well-nourished.  HENT:  Right Ear: External ear normal.  Left Ear: External ear normal.  Nose: Nose normal.  Mouth/Throat: Oropharynx is clear and moist. No oropharyngeal exudate.  Eyes: Pupils are equal, round, and reactive to light. Conjunctivae and EOM are normal. Right eye exhibits no discharge. Left eye exhibits no discharge. No scleral icterus.  Neck: Neck supple. No JVD present. No tracheal deviation present. No thyromegaly present.  Cardiovascular: Normal rate, regular rhythm and normal heart sounds.  Exam reveals no gallop and no friction rub.   No murmur heard. Pulmonary/Chest: Effort normal and breath sounds normal. No respiratory distress. She has no wheezes. She has no rales. She exhibits no tenderness.  Abdominal: Soft. Bowel sounds are normal. She exhibits no distension. There is no tenderness. There is no rebound and no guarding.  Musculoskeletal: She exhibits no edema.       Right shoulder: She exhibits decreased range of motion and pain.        Right wrist: She exhibits decreased range of motion, tenderness and bony tenderness. She exhibits no swelling, no effusion and no crepitus.       Right hip: She exhibits decreased range of motion and tenderness.       Left hip: She exhibits decreased range of motion and tenderness.       Right knee: She exhibits decreased range of motion. Tenderness found.       Left knee: She exhibits decreased range of motion. Tenderness found.  Lymphadenopathy:    She has no cervical adenopathy.  Skin: Skin is warm. No rash noted. No erythema. No pallor.  Psychiatric: She has a normal mood and affect. Her behavior is normal. Judgment and thought content normal.  Vitals reviewed.         Assessment & Plan:  Polyarthralgia - Plan: CBC with Differential/Platelet, Sedimentation rate, ANA, Rheumatoid factor,  Uric acid, Cyclic Citrul Peptide Antibody, IGG, C-reactive protein  Differential diagnosis includes fibromyalgia versus autoimmune diseases. I will begin by obtaining a CBC to evaluate for leukocytosis or any evidence of bone marrow involvement, I'll check a sedimentation rate and a CRP to evaluate for any evidence of elevated inflammation in the body, I'll check an ANA as well as a rheumatoid factor. I will also check a uric acid as well as an anti-CCP. If lab work is completely normal, I will turn our attention towards trying to treat fibromyalgia and I will suggest either Cymbalta or Lyrica. However if lab work suggests an underlying autoimmune illness, she may benefit from seeing a rheumatologist. Again, I politely declined to write a letter for jury duty as I do not believe is medically appropriate at the present time. If there were an evidence of an underlying autoimmune illness, I would reconsider

## 2016-12-21 ENCOUNTER — Other Ambulatory Visit: Payer: Self-pay | Admitting: Family Medicine

## 2016-12-21 DIAGNOSIS — D649 Anemia, unspecified: Secondary | ICD-10-CM

## 2016-12-21 DIAGNOSIS — M75102 Unspecified rotator cuff tear or rupture of left shoulder, not specified as traumatic: Secondary | ICD-10-CM | POA: Diagnosis not present

## 2016-12-21 DIAGNOSIS — R531 Weakness: Secondary | ICD-10-CM | POA: Diagnosis not present

## 2016-12-21 DIAGNOSIS — M25612 Stiffness of left shoulder, not elsewhere classified: Secondary | ICD-10-CM | POA: Diagnosis not present

## 2016-12-21 DIAGNOSIS — M25512 Pain in left shoulder: Secondary | ICD-10-CM | POA: Diagnosis not present

## 2016-12-21 LAB — CBC WITH DIFFERENTIAL/PLATELET
Basophils Absolute: 69 cells/uL (ref 0–200)
Basophils Relative: 1 %
EOS PCT: 3 %
Eosinophils Absolute: 207 cells/uL (ref 15–500)
HCT: 29.6 % — ABNORMAL LOW (ref 35.0–45.0)
HEMOGLOBIN: 9.4 g/dL — AB (ref 12.0–15.0)
Lymphocytes Relative: 29 %
Lymphs Abs: 2001 cells/uL (ref 850–3900)
MCH: 29.7 pg (ref 27.0–33.0)
MCHC: 31.8 g/dL — ABNORMAL LOW (ref 32.0–36.0)
MCV: 93.4 fL (ref 80.0–100.0)
MONO ABS: 690 {cells}/uL (ref 200–950)
MPV: 8.8 fL (ref 7.5–12.5)
Monocytes Relative: 10 %
NEUTROS ABS: 3933 {cells}/uL (ref 1500–7800)
Neutrophils Relative %: 57 %
PLATELETS: 428 10*3/uL — AB (ref 140–400)
RBC: 3.17 MIL/uL — AB (ref 3.80–5.10)
RDW: 13.6 % (ref 11.0–15.0)
WBC: 6.9 10*3/uL (ref 3.8–10.8)

## 2016-12-21 LAB — RHEUMATOID FACTOR

## 2016-12-21 LAB — ANA: Anti Nuclear Antibody(ANA): POSITIVE — AB

## 2016-12-21 LAB — SEDIMENTATION RATE: Sed Rate: 61 mm/hr — ABNORMAL HIGH (ref 0–30)

## 2016-12-21 LAB — ANTI-NUCLEAR AB-TITER (ANA TITER)

## 2016-12-21 LAB — CYCLIC CITRUL PEPTIDE ANTIBODY, IGG: CYCLIC CITRULLIN PEPTIDE AB: 18 U

## 2016-12-21 LAB — C-REACTIVE PROTEIN: CRP: 49.3 mg/L — ABNORMAL HIGH (ref <8.0)

## 2016-12-21 LAB — URIC ACID: URIC ACID, SERUM: 4 mg/dL (ref 2.5–7.0)

## 2016-12-22 ENCOUNTER — Encounter: Payer: Self-pay | Admitting: Family Medicine

## 2016-12-22 ENCOUNTER — Other Ambulatory Visit: Payer: Self-pay | Admitting: Family Medicine

## 2016-12-22 DIAGNOSIS — M2550 Pain in unspecified joint: Secondary | ICD-10-CM

## 2016-12-22 DIAGNOSIS — D649 Anemia, unspecified: Secondary | ICD-10-CM

## 2016-12-22 DIAGNOSIS — E611 Iron deficiency: Secondary | ICD-10-CM

## 2016-12-22 DIAGNOSIS — R768 Other specified abnormal immunological findings in serum: Secondary | ICD-10-CM

## 2016-12-22 LAB — FOLATE: FOLATE: 10.7 ng/mL (ref >5.4)

## 2016-12-22 LAB — IRON: Iron: 13 ug/dL — ABNORMAL LOW (ref 45–160)

## 2016-12-22 LAB — VITAMIN B12: VITAMIN B 12: 418 pg/mL (ref 200–1100)

## 2016-12-22 NOTE — Addendum Note (Signed)
Addended by: Shary Decamp B on: 12/22/2016 03:27 PM   Modules accepted: Orders

## 2016-12-23 ENCOUNTER — Other Ambulatory Visit: Payer: Self-pay | Admitting: Family Medicine

## 2016-12-23 DIAGNOSIS — D649 Anemia, unspecified: Secondary | ICD-10-CM | POA: Diagnosis not present

## 2016-12-25 ENCOUNTER — Other Ambulatory Visit: Payer: Self-pay | Admitting: Family Medicine

## 2016-12-25 DIAGNOSIS — D649 Anemia, unspecified: Secondary | ICD-10-CM | POA: Diagnosis not present

## 2016-12-27 ENCOUNTER — Other Ambulatory Visit: Payer: Self-pay | Admitting: Family Medicine

## 2016-12-27 DIAGNOSIS — D649 Anemia, unspecified: Secondary | ICD-10-CM | POA: Diagnosis not present

## 2016-12-28 ENCOUNTER — Other Ambulatory Visit: Payer: Medicare Other

## 2016-12-28 DIAGNOSIS — D649 Anemia, unspecified: Secondary | ICD-10-CM

## 2016-12-29 ENCOUNTER — Encounter: Payer: Self-pay | Admitting: Family Medicine

## 2016-12-29 DIAGNOSIS — M25612 Stiffness of left shoulder, not elsewhere classified: Secondary | ICD-10-CM | POA: Diagnosis not present

## 2016-12-29 DIAGNOSIS — R531 Weakness: Secondary | ICD-10-CM | POA: Diagnosis not present

## 2016-12-29 DIAGNOSIS — M25512 Pain in left shoulder: Secondary | ICD-10-CM | POA: Diagnosis not present

## 2016-12-29 DIAGNOSIS — M75102 Unspecified rotator cuff tear or rupture of left shoulder, not specified as traumatic: Secondary | ICD-10-CM | POA: Diagnosis not present

## 2016-12-29 LAB — FECAL GLOBIN BY IMMUNOCHEMISTRY
Fecal Globin Immuno: NOT DETECTED
Fecal Globin Immuno: NOT DETECTED
Fecal Globin Immuno: NOT DETECTED

## 2016-12-30 ENCOUNTER — Encounter: Payer: Self-pay | Admitting: Family Medicine

## 2017-01-02 ENCOUNTER — Encounter: Payer: Self-pay | Admitting: Family Medicine

## 2017-01-02 DIAGNOSIS — M75102 Unspecified rotator cuff tear or rupture of left shoulder, not specified as traumatic: Secondary | ICD-10-CM | POA: Diagnosis not present

## 2017-01-02 DIAGNOSIS — M25612 Stiffness of left shoulder, not elsewhere classified: Secondary | ICD-10-CM | POA: Diagnosis not present

## 2017-01-02 DIAGNOSIS — R531 Weakness: Secondary | ICD-10-CM | POA: Diagnosis not present

## 2017-01-02 DIAGNOSIS — M25512 Pain in left shoulder: Secondary | ICD-10-CM | POA: Diagnosis not present

## 2017-01-04 DIAGNOSIS — M25612 Stiffness of left shoulder, not elsewhere classified: Secondary | ICD-10-CM | POA: Diagnosis not present

## 2017-01-04 DIAGNOSIS — M25512 Pain in left shoulder: Secondary | ICD-10-CM | POA: Diagnosis not present

## 2017-01-04 DIAGNOSIS — M75102 Unspecified rotator cuff tear or rupture of left shoulder, not specified as traumatic: Secondary | ICD-10-CM | POA: Diagnosis not present

## 2017-01-04 DIAGNOSIS — R531 Weakness: Secondary | ICD-10-CM | POA: Diagnosis not present

## 2017-01-06 ENCOUNTER — Telehealth: Payer: Self-pay | Admitting: Family Medicine

## 2017-01-06 MED ORDER — PREDNISONE 20 MG PO TABS: ORAL_TABLET | ORAL | 0 refills | Status: DC

## 2017-01-06 NOTE — Telephone Encounter (Signed)
Pt can barely walk, knees feel like there are bricks in them. [place] to prescribe doxycycline, thinks she still has lymes disease even after a negative blood test. [facility] [place], if we decide to call it in. Pt is literally "FREAKING OUT" per her.

## 2017-01-06 NOTE — Telephone Encounter (Signed)
Per Dr. Dennard Schaumann pt is neg for lymes dz and does not need doxy but will put her on pred taper dose and wants to see her on Monday. Pt aware and med sent to pharm.

## 2017-01-09 ENCOUNTER — Encounter: Payer: Self-pay | Admitting: Family Medicine

## 2017-01-09 ENCOUNTER — Ambulatory Visit (INDEPENDENT_AMBULATORY_CARE_PROVIDER_SITE_OTHER): Payer: Medicare Other | Admitting: Family Medicine

## 2017-01-09 VITALS — BP 118/68 | HR 60 | Temp 98.0°F | Resp 14 | Ht 64.0 in | Wt 130.0 lb

## 2017-01-09 DIAGNOSIS — M329 Systemic lupus erythematosus, unspecified: Secondary | ICD-10-CM | POA: Diagnosis not present

## 2017-01-09 DIAGNOSIS — M75102 Unspecified rotator cuff tear or rupture of left shoulder, not specified as traumatic: Secondary | ICD-10-CM | POA: Diagnosis not present

## 2017-01-09 DIAGNOSIS — M25512 Pain in left shoulder: Secondary | ICD-10-CM | POA: Diagnosis not present

## 2017-01-09 DIAGNOSIS — R531 Weakness: Secondary | ICD-10-CM | POA: Diagnosis not present

## 2017-01-09 DIAGNOSIS — M25612 Stiffness of left shoulder, not elsewhere classified: Secondary | ICD-10-CM | POA: Diagnosis not present

## 2017-01-09 LAB — CBC WITH DIFFERENTIAL/PLATELET
Basophils Absolute: 0 cells/uL (ref 0–200)
Basophils Relative: 0 %
Eosinophils Absolute: 91 cells/uL (ref 15–500)
Eosinophils Relative: 1 %
HEMATOCRIT: 35.3 % (ref 35.0–45.0)
Hemoglobin: 11.2 g/dL — ABNORMAL LOW (ref 12.0–15.0)
LYMPHS PCT: 43 %
Lymphs Abs: 3913 cells/uL — ABNORMAL HIGH (ref 850–3900)
MCH: 29.7 pg (ref 27.0–33.0)
MCHC: 31.7 g/dL — AB (ref 32.0–36.0)
MCV: 93.6 fL (ref 80.0–100.0)
MONO ABS: 910 {cells}/uL (ref 200–950)
MONOS PCT: 10 %
MPV: 9.1 fL (ref 7.5–12.5)
NEUTROS PCT: 46 %
Neutro Abs: 4186 cells/uL (ref 1500–7800)
Platelets: 516 10*3/uL — ABNORMAL HIGH (ref 140–400)
RBC: 3.77 MIL/uL — AB (ref 3.80–5.10)
RDW: 13.8 % (ref 11.0–15.0)
WBC: 9.1 10*3/uL (ref 3.8–10.8)

## 2017-01-09 LAB — URINALYSIS, ROUTINE W REFLEX MICROSCOPIC
BILIRUBIN URINE: NEGATIVE
GLUCOSE, UA: NEGATIVE
Ketones, ur: NEGATIVE
Nitrite: NEGATIVE
PROTEIN: NEGATIVE
pH: 7 (ref 5.0–8.0)

## 2017-01-09 LAB — URINALYSIS, MICROSCOPIC ONLY
BACTERIA UA: NONE SEEN [HPF]
CASTS: NONE SEEN [LPF]
Crystals: NONE SEEN [HPF]
RBC / HPF: NONE SEEN RBC/HPF (ref <=2)
YEAST: NONE SEEN [HPF]

## 2017-01-09 MED ORDER — PREDNISONE 10 MG PO TABS: 10.0000 mg | ORAL_TABLET | Freq: Every day | ORAL | 1 refills | Status: DC

## 2017-01-09 NOTE — Progress Notes (Signed)
Subjective:    Patient ID: Annette Barker, female    DOB: 13-Sep-1945, 71 y.o.   MRN: [id number]  HPI  06/2016 Patient presents with several months of worsening fatigue. She is accompanied by her husband. They're both very concerned. She has no energy. She wants to go and do but she has very little stamina. Symptoms began after the fall when she was bitten by 2 tics. Therefore the concern about Lyme disease. She denies any chest pain, shortness of breath, dyspnea on exertion, orthopnea. She denies any fevers or night sweats or bruising. She denies any blood in her stool or hematochezia. She denies any nausea vomiting. She does have chronic diarrhea but she's been diagnosed with irritable bowel syndrome. She denies any weight loss. She is complaining of left shoulder pain and lower back pain however she just saw an orthopedist yesterday was placed on prednisone. X-rays were obtained of the lower back and the shoulder and showed no abnormalities. She denies any rash or bone pain anywhere else in the body.  At that time, my plan was: Review of systems and exam is reassuring. I can find no source for the patient's fatigue. I will check a battery of blood tests given the patient's significant concern. I will check a CBC, CMP, TSH, vitamin B12, and iron panel. Given her diarrhea, I will check for fecal occult blood 3 to evaluate for inflammatory bowel disease such as Crohn's. Also check a sedimentation rate. I will also check Lyme titers. Await the results of blood tests and stool tests  12/20/16 Since the patient was last seen, she underwent surgery for torn rotator cuff. She is now complaining of severe pain in her right shoulder. She is also having severe pain in her right wrist primarily over the carpal row adjacent to the [facility] along the dorsal surface of the fifth metacarpal. She also complains of pain in both hips primarily posteriorly. She is also complaining of severe pain in both knees as well as  her ankles. She finds it difficult to even walk. She is seeing orthopedic surgeon has recommended physical therapy and even suggested an antidepressant. She's had x-rays of the right shoulder. She also complains of severe fatigue. She does not endorse any depression, fever, chills, rash, nausea vomiting or diarrhea. Differential diagnosis includes autoimmune diseases versus fibromyalgia. The patient also requested a medical exemption from jury duty. Unfortunately at the present time I do not have a medical diagnosis that would justify exclusion from jury duty. Therefore I declined unless labworks suggest an underlying severe autoimmune illness.  At that time, my plan was: Differential diagnosis includes fibromyalgia versus autoimmune diseases. I will begin by obtaining a CBC to evaluate for leukocytosis or any evidence of bone marrow involvement, I'll check a sedimentation rate and a CRP to evaluate for any evidence of elevated inflammation in the body, I'll check an ANA as well as a rheumatoid factor. I will also check a uric acid as well as an anti-CCP. If lab work is completely normal, I will turn our attention towards trying to treat fibromyalgia and I will suggest either Cymbalta or Lyrica. However if lab work suggests an underlying autoimmune illness, she may benefit from seeing a rheumatologist. Again, I politely declined to write a letter for jury duty as I do not believe is medically appropriate at the present time. If there were an evidence of an underlying autoimmune illness, I would reconsider  01/09/17 Patient sedimentation rate came back greater than 60. Her  ANA was positive. She was also found to have new onset anemia that was not previously diagnosed in February. Stool cards were negative for blood 3. Rheumatology was consult and however appointment is not until August 27. Patient's polyarthralgias and myalgias progress to the point that the patient was essentially non-mobile last week when she  called. In desperation, the patient stated that she was in severe pain and couldn't even move. Patient was started on prednisone taper pack. Her symptoms improved dramatically is just on prednisone. She denies any history of kidney problems. She does report a history of diagnosis of pleurisy. She denies any history of seizures or psychosis. She denies any history of liver problems or pericarditis. Therefore of the 11 categories, patient has 3 apparent symptoms. She has had arthralgias. She has a positive ANA. She has what appears to be a hemolytic anemia although LDH and haptoglobin are pending. It is possible she has a fourth symptom of pleurisy in the past. Past Medical History:  Diagnosis Date  . Anxiety   . Arthritis    in her hands   . Elevated cholesterol   . Hair loss   . Hemorrhoids   . Hypothyroidism    on synthroid  . IBS (irritable bowel syndrome)   . Osteoporosis 03/2015   T score -2.8  . Thyroid disease    hypothyroid   Past Surgical History:  Procedure Laterality Date  . LAPAROSCOPIC SALPINGO OOPHERECTOMY Bilateral 10/29/2014   Procedure: LAPAROSCOPIC SALPINGO OOPHORECTOMY;  Surgeon: [name], MD;  Location: [facility];  Service: Gynecology;  Laterality: Bilateral;  1:00pm OR time requested  1 1/2 hours OR time requested.  Marland Kitchen SHOULDER SURGERY    . TUBAL LIGATION     Current Outpatient Prescriptions on File Prior to Visit  Medication Sig Dispense Refill  . diazepam (VALIUM) 5 MG tablet Take 1 tablet (5 mg total) by mouth at bedtime. 30 tablet 1  . Estradiol (ESTROGEL) 0.75 MG/1.25 GM (0.06%) topical gel Place 1.25 g onto the skin daily. 50 g 6  . levothyroxine (SYNTHROID, LEVOTHROID) 75 MCG tablet Take 1 tablet (75 mcg total) by mouth daily before breakfast. 90 tablet 3  . predniSONE (DELTASONE) 20 MG tablet 3 tabs po qd x 2 days, 2 tabs po qd x 2 days, 1 tab po qd x 2 days 12 tablet 0  . Probiotic Product (PROBIOTIC COMPLEX ACIDOPHILUS PO) Take 15 mLs by mouth daily.  Reported on 05/28/2015    . progesterone (PROMETRIUM) 100 MG capsule Take 1 capsule (100 mg total) by mouth at bedtime. 90 capsule 1   No current facility-administered medications on file prior to visit.    Allergies  Allergen Reactions  . Statins Other (See Comments)    Body cramps all over body   Social History   Social History  . Marital status: Married    Spouse name: N/A  . Number of children: N/A  . Years of education: N/A   Occupational History  . Not on file.   Social History Main Topics  . Smoking status: Former Smoker    Types: Cigarettes    Quit date: 05/27/1991  . Smokeless tobacco: Never Used  . Alcohol use 0.0 oz/week     Comment: rare  . Drug use: No  . Sexual activity: Yes    Birth control/ protection: Post-menopausal     Comment: 1st intercourse 71 yo--Fewer than 5 partners   Other Topics Concern  . Not on file   Social History Narrative  .  No narrative on file       Review of Systems  All other systems reviewed and are negative.      Objective:   Physical Exam  Constitutional: She appears well-developed and well-nourished.  HENT:  Right Ear: External ear normal.  Left Ear: External ear normal.  Nose: Nose normal.  Mouth/Throat: Oropharynx is clear and moist. No oropharyngeal exudate.  Eyes: Pupils are equal, round, and reactive to light. Conjunctivae and EOM are normal. Right eye exhibits no discharge. Left eye exhibits no discharge. No scleral icterus.  Neck: Neck supple. No JVD present. No tracheal deviation present. No thyromegaly present.  Cardiovascular: Normal rate, regular rhythm and normal heart sounds.  Exam reveals no gallop and no friction rub.   No murmur heard. Pulmonary/Chest: Effort normal and breath sounds normal. No respiratory distress. She has no wheezes. She has no rales. She exhibits no tenderness.  Abdominal: Soft. Bowel sounds are normal. She exhibits no distension. There is no tenderness. There is no rebound and no  guarding.  Musculoskeletal: She exhibits no edema.       Right shoulder: She exhibits decreased range of motion and pain.       Right wrist: She exhibits decreased range of motion, tenderness and bony tenderness. She exhibits no swelling, no effusion and no crepitus.       Right hip: She exhibits decreased range of motion and tenderness.       Left hip: She exhibits decreased range of motion and tenderness.       Right knee: She exhibits decreased range of motion. Tenderness found.       Left knee: She exhibits decreased range of motion. Tenderness found.  Lymphadenopathy:    She has no cervical adenopathy.  Skin: Skin is warm. No rash noted. No erythema. No pallor.  Psychiatric: She has a normal mood and affect. Her behavior is normal. Judgment and thought content normal.  Vitals reviewed.         Assessment & Plan:  Systemic lupus erythematosus, unspecified SLE type, unspecified organ involvement status ([place]) - Plan: predniSONE (DELTASONE) 10 MG tablet, ANA+ENA+DNA/DS+Antich+Centr, B. burgdorfi antibodies by WB, CBC with Differential/Platelet, Haptoglobin, Lactate dehydrogenase, Urinalysis, Routine w reflex microscopic I will confirm hemolytic anemia by checking a CBC, haptoglobin, LDH. I will evaluate for nephritis by checking a urinalysis to evaluate for hematuria or bands. Patient requests a repeat Lyme titer. I will also check the patient for anti-double-stranded DNA. Meanwhile I will wean the patient down to prednisone 10 mg a day until she sees the rheumatologist next week. I will defer starting plaquenil to their expert opinion.  Originally, I had hoped to keep the patient off prednisone until seen by rheumatology however last week the patient was desperate and I felt like we were out of time. I am happy that she has improved on the prednisone.

## 2017-01-10 LAB — LACTATE DEHYDROGENASE: LDH: 110 U/L — ABNORMAL LOW (ref 120–250)

## 2017-01-10 LAB — HAPTOGLOBIN: Haptoglobin: 341 mg/dL — ABNORMAL HIGH (ref 43–212)

## 2017-01-10 LAB — ANA REFLEX TITER, PATTERN, CASCADE: ANA by IFA: NEGATIVE

## 2017-01-11 ENCOUNTER — Encounter: Payer: Self-pay | Admitting: Family Medicine

## 2017-01-11 DIAGNOSIS — M25512 Pain in left shoulder: Secondary | ICD-10-CM | POA: Diagnosis not present

## 2017-01-11 DIAGNOSIS — M75102 Unspecified rotator cuff tear or rupture of left shoulder, not specified as traumatic: Secondary | ICD-10-CM | POA: Diagnosis not present

## 2017-01-11 DIAGNOSIS — R531 Weakness: Secondary | ICD-10-CM | POA: Diagnosis not present

## 2017-01-11 DIAGNOSIS — M25612 Stiffness of left shoulder, not elsewhere classified: Secondary | ICD-10-CM | POA: Diagnosis not present

## 2017-01-11 LAB — LYME ABY, WSTRN BLT IGG & IGM W/BANDS
B burgdorferi IgG Abs (IB): NEGATIVE
B burgdorferi IgM Abs (IB): NEGATIVE
LYME DISEASE 18 KD IGG: NONREACTIVE
LYME DISEASE 23 KD IGG: NONREACTIVE
LYME DISEASE 28 KD IGG: NONREACTIVE
LYME DISEASE 30 KD IGG: NONREACTIVE
LYME DISEASE 41 KD IGM: NONREACTIVE
Lyme Disease 23 kD IgM: NONREACTIVE
Lyme Disease 39 kD IgG: NONREACTIVE
Lyme Disease 39 kD IgM: NONREACTIVE
Lyme Disease 41 kD IgG: NONREACTIVE
Lyme Disease 45 kD IgG: NONREACTIVE
Lyme Disease 58 kD IgG: NONREACTIVE
Lyme Disease 66 kD IgG: NONREACTIVE
Lyme Disease 93 kD IgG: NONREACTIVE

## 2017-01-12 ENCOUNTER — Encounter: Payer: Self-pay | Admitting: Family Medicine

## 2017-01-16 DIAGNOSIS — M25612 Stiffness of left shoulder, not elsewhere classified: Secondary | ICD-10-CM | POA: Diagnosis not present

## 2017-01-16 DIAGNOSIS — M75102 Unspecified rotator cuff tear or rupture of left shoulder, not specified as traumatic: Secondary | ICD-10-CM | POA: Diagnosis not present

## 2017-01-16 DIAGNOSIS — M2550 Pain in unspecified joint: Secondary | ICD-10-CM | POA: Diagnosis not present

## 2017-01-16 DIAGNOSIS — M064 Inflammatory polyarthropathy: Secondary | ICD-10-CM | POA: Diagnosis not present

## 2017-01-16 DIAGNOSIS — M25512 Pain in left shoulder: Secondary | ICD-10-CM | POA: Diagnosis not present

## 2017-01-16 DIAGNOSIS — D500 Iron deficiency anemia secondary to blood loss (chronic): Secondary | ICD-10-CM | POA: Diagnosis not present

## 2017-01-16 DIAGNOSIS — R531 Weakness: Secondary | ICD-10-CM | POA: Diagnosis not present

## 2017-01-16 DIAGNOSIS — Z6822 Body mass index (BMI) 22.0-22.9, adult: Secondary | ICD-10-CM | POA: Diagnosis not present

## 2017-01-16 DIAGNOSIS — M810 Age-related osteoporosis without current pathological fracture: Secondary | ICD-10-CM | POA: Diagnosis not present

## 2017-01-16 DIAGNOSIS — R5383 Other fatigue: Secondary | ICD-10-CM | POA: Diagnosis not present

## 2017-01-17 ENCOUNTER — Telehealth: Payer: Self-pay | Admitting: Family Medicine

## 2017-01-17 NOTE — Telephone Encounter (Signed)
All notes from 2/18 sent via [facility] fax

## 2017-01-17 NOTE — Telephone Encounter (Signed)
New Message  Annette Barker voiced she called yesterday to request lab results and only result they received was the lyme, Annette Barker voiced needing all results.  [phone]

## 2017-01-18 DIAGNOSIS — M25612 Stiffness of left shoulder, not elsewhere classified: Secondary | ICD-10-CM | POA: Diagnosis not present

## 2017-01-18 DIAGNOSIS — M25512 Pain in left shoulder: Secondary | ICD-10-CM | POA: Diagnosis not present

## 2017-01-18 DIAGNOSIS — M75102 Unspecified rotator cuff tear or rupture of left shoulder, not specified as traumatic: Secondary | ICD-10-CM | POA: Diagnosis not present

## 2017-01-18 DIAGNOSIS — R531 Weakness: Secondary | ICD-10-CM | POA: Diagnosis not present

## 2017-01-24 ENCOUNTER — Telehealth: Payer: Self-pay | Admitting: *Deleted

## 2017-01-24 DIAGNOSIS — M75102 Unspecified rotator cuff tear or rupture of left shoulder, not specified as traumatic: Secondary | ICD-10-CM | POA: Diagnosis not present

## 2017-01-24 DIAGNOSIS — M25612 Stiffness of left shoulder, not elsewhere classified: Secondary | ICD-10-CM | POA: Diagnosis not present

## 2017-01-24 DIAGNOSIS — R531 Weakness: Secondary | ICD-10-CM | POA: Diagnosis not present

## 2017-01-24 DIAGNOSIS — M25512 Pain in left shoulder: Secondary | ICD-10-CM | POA: Diagnosis not present

## 2017-01-24 DIAGNOSIS — M810 Age-related osteoporosis without current pathological fracture: Secondary | ICD-10-CM

## 2017-01-24 NOTE — Telephone Encounter (Signed)
Pt called asking when her next Dexa is due I told her Nov. 2018 per annual note.  Pt said she was referral to a rheumatologist who started her on prednisone 15 mg daily for autoimmune disease no name of diagnoses yet per patient. She asked if dexa should be done sooner due to this? Please advise

## 2017-01-24 NOTE — Telephone Encounter (Signed)
2 months is okay.

## 2017-01-24 NOTE — Telephone Encounter (Signed)
Front desk will schedule, order placed.

## 2017-01-26 ENCOUNTER — Encounter: Payer: Self-pay | Admitting: Family Medicine

## 2017-01-30 DIAGNOSIS — M75102 Unspecified rotator cuff tear or rupture of left shoulder, not specified as traumatic: Secondary | ICD-10-CM | POA: Diagnosis not present

## 2017-01-30 DIAGNOSIS — M25512 Pain in left shoulder: Secondary | ICD-10-CM | POA: Diagnosis not present

## 2017-01-30 DIAGNOSIS — R531 Weakness: Secondary | ICD-10-CM | POA: Diagnosis not present

## 2017-01-30 DIAGNOSIS — M25612 Stiffness of left shoulder, not elsewhere classified: Secondary | ICD-10-CM | POA: Diagnosis not present

## 2017-01-31 DIAGNOSIS — M25512 Pain in left shoulder: Secondary | ICD-10-CM | POA: Diagnosis not present

## 2017-01-31 MED ORDER — DIAZEPAM 5 MG PO TABS: 5.0000 mg | ORAL_TABLET | Freq: Every day | ORAL | 1 refills | Status: DC

## 2017-02-14 DIAGNOSIS — Z6823 Body mass index (BMI) 23.0-23.9, adult: Secondary | ICD-10-CM | POA: Diagnosis not present

## 2017-02-14 DIAGNOSIS — M2550 Pain in unspecified joint: Secondary | ICD-10-CM | POA: Diagnosis not present

## 2017-02-14 DIAGNOSIS — M810 Age-related osteoporosis without current pathological fracture: Secondary | ICD-10-CM | POA: Diagnosis not present

## 2017-02-14 DIAGNOSIS — M353 Polymyalgia rheumatica: Secondary | ICD-10-CM | POA: Diagnosis not present

## 2017-03-08 DIAGNOSIS — H25813 Combined forms of age-related cataract, bilateral: Secondary | ICD-10-CM | POA: Diagnosis not present

## 2017-03-08 DIAGNOSIS — H3561 Retinal hemorrhage, right eye: Secondary | ICD-10-CM | POA: Diagnosis not present

## 2017-03-08 DIAGNOSIS — H40013 Open angle with borderline findings, low risk, bilateral: Secondary | ICD-10-CM | POA: Diagnosis not present

## 2017-03-08 DIAGNOSIS — M353 Polymyalgia rheumatica: Secondary | ICD-10-CM | POA: Diagnosis not present

## 2017-03-15 DIAGNOSIS — M810 Age-related osteoporosis without current pathological fracture: Secondary | ICD-10-CM | POA: Diagnosis not present

## 2017-03-15 DIAGNOSIS — D509 Iron deficiency anemia, unspecified: Secondary | ICD-10-CM | POA: Diagnosis not present

## 2017-03-15 DIAGNOSIS — Z7952 Long term (current) use of systemic steroids: Secondary | ICD-10-CM | POA: Diagnosis not present

## 2017-03-15 DIAGNOSIS — M353 Polymyalgia rheumatica: Secondary | ICD-10-CM | POA: Diagnosis not present

## 2017-03-15 DIAGNOSIS — M2550 Pain in unspecified joint: Secondary | ICD-10-CM | POA: Diagnosis not present

## 2017-03-15 DIAGNOSIS — Z6823 Body mass index (BMI) 23.0-23.9, adult: Secondary | ICD-10-CM | POA: Diagnosis not present

## 2017-03-16 ENCOUNTER — Ambulatory Visit: Payer: Medicare Other

## 2017-03-17 DIAGNOSIS — Z23 Encounter for immunization: Secondary | ICD-10-CM | POA: Diagnosis not present

## 2017-03-20 ENCOUNTER — Encounter: Payer: Self-pay | Admitting: Family Medicine

## 2017-04-18 ENCOUNTER — Encounter: Payer: Self-pay | Admitting: *Deleted

## 2017-04-18 ENCOUNTER — Ambulatory Visit (INDEPENDENT_AMBULATORY_CARE_PROVIDER_SITE_OTHER): Payer: Medicare Other

## 2017-04-18 ENCOUNTER — Telehealth: Payer: Self-pay | Admitting: Gynecology

## 2017-04-18 ENCOUNTER — Encounter: Payer: Self-pay | Admitting: Gynecology

## 2017-04-18 DIAGNOSIS — M810 Age-related osteoporosis without current pathological fracture: Secondary | ICD-10-CM

## 2017-04-18 NOTE — Telephone Encounter (Signed)
Sent mychart message

## 2017-04-18 NOTE — Telephone Encounter (Signed)
Tell patient her most recent bone density continues to show osteoporosis with continued loss from her prior study.  Recommend office visit to discuss treatment options.  Recommend also checking a vitamin D level.

## 2017-04-20 ENCOUNTER — Telehealth: Payer: Self-pay | Admitting: Family Medicine

## 2017-04-20 MED ORDER — LEVOTHYROXINE SODIUM 75 MCG PO TABS: 75.0000 ug | ORAL_TABLET | Freq: Every day | ORAL | 3 refills | Status: DC

## 2017-04-20 NOTE — Telephone Encounter (Signed)
Pharmacy told patient to call us to get a refill on her levothyroxine if possible, she is out of refills  [facility]

## 2017-04-20 NOTE — Telephone Encounter (Signed)
Medication called/sent to requested pharmacy  

## 2017-04-21 NOTE — Telephone Encounter (Signed)
Patient informed. 

## 2017-04-21 NOTE — Telephone Encounter (Signed)
Patient had vitamin d drawn at PCP will bring result with her.

## 2017-04-25 DIAGNOSIS — Z1231 Encounter for screening mammogram for malignant neoplasm of breast: Secondary | ICD-10-CM | POA: Diagnosis not present

## 2017-04-25 LAB — HM MAMMOGRAPHY

## 2017-04-26 ENCOUNTER — Encounter: Payer: Self-pay | Admitting: Family Medicine

## 2017-04-27 ENCOUNTER — Encounter: Payer: Self-pay | Admitting: Gynecology

## 2017-04-27 ENCOUNTER — Other Ambulatory Visit: Payer: Self-pay

## 2017-04-27 ENCOUNTER — Ambulatory Visit (INDEPENDENT_AMBULATORY_CARE_PROVIDER_SITE_OTHER): Payer: Medicare Other | Admitting: Gynecology

## 2017-04-27 VITALS — BP 122/76

## 2017-04-27 DIAGNOSIS — M810 Age-related osteoporosis without current pathological fracture: Secondary | ICD-10-CM

## 2017-04-27 MED ORDER — ALENDRONATE SODIUM 70 MG PO TABS: 70.0000 mg | ORAL_TABLET | ORAL | 11 refills | Status: DC

## 2017-04-27 NOTE — Progress Notes (Signed)
    Annette Barker 22-Aug-1945 [id number]        71 y.o.  [id number] presents to discuss her most recent bone density which showed osteoporosis.  Past medical history,surgical history, problem list, medications, allergies, family history and social history were all reviewed and documented in the EPIC chart.  Directed ROS with pertinent positives and negatives documented in the history of present illness/assessment and plan.  Exam: Vitals:   04/27/17 0937  BP: 122/76   General appearance:  Normal  Assessment/Plan:  71 y.o. [id number] with most recent bone density 03/2017 T score -3.2.  When compared to her prior bone density 2016 she had a 4% loss at the AP spine 7.8% loss at the left total hip and 8.6% loss at the right total hip.  All exceeding least significant change calculation.  Had discussed her bone loss in the past and she is always been reluctant to consider medication.  She is on HRT to include Estrogel and Prometrium.  She has been inactive over the past year due to a fall and surgery on her rotator cuff.  Also has been on prednisone daily for her arthritis.  I reviewed her increased risk for fracture and diagnosis of osteoporosis.  I also discussed with her the significant bone loss from her prior study.  Cannot remember the last time she had a vitamin D level checked and we went ahead and order this today.  I recommended that she consider to start on an anti-resorptive therapy.  We reviewed various options to include bisphosphate's either oral or IV, Prolia and Forteo.  Evista discussed but since she is on HRT not appropriate.  I think given the picture at this point Forteo not indicated.  I discussed bisphosphonates and Prolia in detail to include administration, side effects and risks.  We reviewed GERD, osteonecrosis of the jaw, atypical fractures particularly with prolonged use, rashes and infections.  She also asked questions about OTC products which we discussed.  Adequate calcium and  vitamin D intake reviewed.  After lengthy discussion patient wants to go ahead and start on alendronate 70 mg weekly.  I discussed how to take the medication and the need to call me if she has any issues particularly GI.  We will go ahead and start this now and plan on repeating the bone density in 2 years.  Greater than 50% of my time was spent in direct face to face counseling and coordination of care with the patient.     [name] MD, 11:14 AM 04/27/2017

## 2017-04-27 NOTE — Patient Instructions (Signed)
Start on the Fosamax as we discussed.  Call me if you have any issues after starting the medication.

## 2017-05-08 ENCOUNTER — Telehealth: Payer: Self-pay | Admitting: Family Medicine

## 2017-05-08 NOTE — Telephone Encounter (Signed)
ok 

## 2017-05-08 NOTE — Telephone Encounter (Signed)
Pharm requesting a refill on Valium - Ok to refill??

## 2017-05-09 ENCOUNTER — Ambulatory Visit: Payer: Medicare Other | Admitting: Gynecology

## 2017-05-09 MED ORDER — DIAZEPAM 5 MG PO TABS: 5.0000 mg | ORAL_TABLET | Freq: Every day | ORAL | 2 refills | Status: DC

## 2017-05-09 NOTE — Telephone Encounter (Signed)
Medication called/sent to requested pharmacy  

## 2017-06-01 DIAGNOSIS — Z7952 Long term (current) use of systemic steroids: Secondary | ICD-10-CM | POA: Diagnosis not present

## 2017-06-01 DIAGNOSIS — M353 Polymyalgia rheumatica: Secondary | ICD-10-CM | POA: Diagnosis not present

## 2017-06-01 DIAGNOSIS — M2550 Pain in unspecified joint: Secondary | ICD-10-CM | POA: Diagnosis not present

## 2017-06-01 DIAGNOSIS — M810 Age-related osteoporosis without current pathological fracture: Secondary | ICD-10-CM | POA: Diagnosis not present

## 2017-06-01 DIAGNOSIS — Z6823 Body mass index (BMI) 23.0-23.9, adult: Secondary | ICD-10-CM | POA: Diagnosis not present

## 2017-08-01 DIAGNOSIS — Z6824 Body mass index (BMI) 24.0-24.9, adult: Secondary | ICD-10-CM | POA: Diagnosis not present

## 2017-08-01 DIAGNOSIS — M2550 Pain in unspecified joint: Secondary | ICD-10-CM | POA: Diagnosis not present

## 2017-08-01 DIAGNOSIS — M810 Age-related osteoporosis without current pathological fracture: Secondary | ICD-10-CM | POA: Diagnosis not present

## 2017-08-01 DIAGNOSIS — M722 Plantar fascial fibromatosis: Secondary | ICD-10-CM | POA: Diagnosis not present

## 2017-08-01 DIAGNOSIS — Z7952 Long term (current) use of systemic steroids: Secondary | ICD-10-CM | POA: Diagnosis not present

## 2017-08-01 DIAGNOSIS — M353 Polymyalgia rheumatica: Secondary | ICD-10-CM | POA: Diagnosis not present

## 2017-08-17 ENCOUNTER — Encounter: Payer: Self-pay | Admitting: Family Medicine

## 2017-08-17 ENCOUNTER — Ambulatory Visit (INDEPENDENT_AMBULATORY_CARE_PROVIDER_SITE_OTHER): Payer: Medicare Other | Admitting: Family Medicine

## 2017-08-17 VITALS — BP 132/70 | HR 60 | Temp 98.2°F | Resp 12 | Ht 64.0 in | Wt 136.0 lb

## 2017-08-17 DIAGNOSIS — M722 Plantar fascial fibromatosis: Secondary | ICD-10-CM

## 2017-08-17 NOTE — Progress Notes (Signed)
Subjective:    Patient ID: Annette Barker, female    DOB: [birth year], 72 y.o.   MRN: [id number]  HPI Patient presents with pain in both heels right greater than left.  Pain is located on the plantar aspect of the heel at the insertion of the plantar fascia.  She describes it as a stabbing pain whenever she first wakes up in the morning as soon as she tries to walk.  She states that the pain will gradually improved and then after prolonged standing and prolonged walking, the pain will return with severity.  She is unable to take NSAIDs due to chronic prednisone use due to PMR.  She denies any falls or injuries.  There is no visible abnormality in the foot Past Medical History:  Diagnosis Date  . Anxiety   . Arthritis    in her hands   . Elevated cholesterol   . Hair loss   . Hemorrhoids   . Hypothyroidism    on synthroid  . IBS (irritable bowel syndrome)   . Osteoporosis 03/2017   T score -3.2  . Thyroid disease    hypothyroid   Past Surgical History:  Procedure Laterality Date  . LAPAROSCOPIC SALPINGO OOPHERECTOMY Bilateral 10/29/2014   Procedure: LAPAROSCOPIC SALPINGO OOPHORECTOMY;  Surgeon: [name], MD;  Location: [facility];  Service: Gynecology;  Laterality: Bilateral;  1:00pm OR time requested  1 1/2 hours OR time requested.  Marland Kitchen SHOULDER SURGERY    . TUBAL LIGATION     Current Outpatient Medications on File Prior to Visit  Medication Sig Dispense Refill  . alendronate (FOSAMAX) 70 MG tablet Take 1 tablet (70 mg total) by mouth every 7 (seven) days. Take with a full glass of water on an empty stomach. 4 tablet 11  . diazepam (VALIUM) 5 MG tablet Take 1 tablet (5 mg total) by mouth at bedtime. 30 tablet 2  . Estradiol (ESTROGEL) 0.75 MG/1.25 GM (0.06%) topical gel Place 1.25 g onto the skin daily. (Patient taking differently: Place 1.25 g onto the skin every other day. ) 50 g 6  . levothyroxine (SYNTHROID, LEVOTHROID) 75 MCG tablet Take 1 tablet (75 mcg total) by mouth  daily before breakfast. 90 tablet 3  . predniSONE (DELTASONE) 10 MG tablet Take 1 tablet (10 mg total) by mouth daily with breakfast. 30 tablet 1  . progesterone (PROMETRIUM) 100 MG capsule Take 1 capsule (100 mg total) by mouth at bedtime. (Patient taking differently: Take 100 mg by mouth at bedtime. 2 per week) 90 capsule 1   No current facility-administered medications on file prior to visit.    Allergies  Allergen Reactions  . Statins Other (See Comments)    Body cramps all over body   Social History   Socioeconomic History  . Marital status: Married    Spouse name: Not on file  . Number of children: Not on file  . Years of education: Not on file  . Highest education level: Not on file  Occupational History  . Not on file  Social Needs  . Financial resource strain: Not on file  . Food insecurity:    Worry: Not on file    Inability: Not on file  . Transportation needs:    Medical: Not on file    Non-medical: Not on file  Tobacco Use  . Smoking status: Former Smoker    Types: Cigarettes    Last attempt to quit: 05/27/1991    Years since quitting: 26.2  . Smokeless  tobacco: Never Used  Substance and Sexual Activity  . Alcohol use: Yes    Alcohol/week: 0.0 oz    Comment: rare  . Drug use: No  . Sexual activity: Yes    Birth control/protection: Post-menopausal    Comment: 1st intercourse 72 yo--Fewer than 5 partners  Lifestyle  . Physical activity:    Days per week: Not on file    Minutes per session: Not on file  . Stress: Not on file  Relationships  . Social connections:    Talks on phone: Not on file    Gets together: Not on file    Attends religious service: Not on file    Active member of club or organization: Not on file    Attends meetings of clubs or organizations: Not on file    Relationship status: Not on file  . Intimate partner violence:    Fear of current or ex partner: Not on file    Emotionally abused: Not on file    Physically abused: Not on  file    Forced sexual activity: Not on file  Other Topics Concern  . Not on file  Social History Narrative  . Not on file      Review of Systems  All other systems reviewed and are negative.      Objective:   Physical Exam  Cardiovascular: Normal rate, regular rhythm and normal heart sounds.  Pulmonary/Chest: Effort normal and breath sounds normal.  Musculoskeletal:       Right foot: There is tenderness and bony tenderness. There is normal range of motion, no swelling, no crepitus and no deformity.       Feet:  Vitals reviewed.         Assessment & Plan:  Plantar fasciitis - Plan: DG Foot Complete Right  I believe the patient has plantar fasciitis.  Pain x-ray of the right heel to rule out stress fracture given her long-term prednisone use.  Otherwise I recommended stretches for plantar fasciitis.  I recommended a night splint.  I recommended orthotics with arch supports for her shoes.  If no better, would recommend a cortisone injection in the heel versus podiatry consult

## 2017-09-11 ENCOUNTER — Ambulatory Visit
Admission: RE | Admit: 2017-09-11 | Discharge: 2017-09-11 | Disposition: A | Discharge status: Home / Self Care | Payer: Medicare Other | Source: Ambulatory Visit | Attending: Family Medicine | Admitting: Family Medicine

## 2017-09-11 DIAGNOSIS — M722 Plantar fascial fibromatosis: Secondary | ICD-10-CM

## 2017-09-11 DIAGNOSIS — M7731 Calcaneal spur, right foot: Secondary | ICD-10-CM | POA: Diagnosis not present

## 2017-09-14 ENCOUNTER — Encounter: Payer: Self-pay | Admitting: Family Medicine

## 2017-10-26 ENCOUNTER — Ambulatory Visit
Admission: RE | Admit: 2017-10-26 | Discharge: 2017-10-26 | Disposition: A | Discharge status: Home / Self Care | Payer: Medicare Other | Source: Ambulatory Visit | Attending: Family Medicine | Admitting: Family Medicine

## 2017-10-26 ENCOUNTER — Encounter: Payer: Self-pay | Admitting: Family Medicine

## 2017-10-26 ENCOUNTER — Other Ambulatory Visit: Payer: Self-pay

## 2017-10-26 ENCOUNTER — Ambulatory Visit (INDEPENDENT_AMBULATORY_CARE_PROVIDER_SITE_OTHER): Payer: Medicare Other | Admitting: Family Medicine

## 2017-10-26 ENCOUNTER — Other Ambulatory Visit: Payer: Self-pay | Admitting: *Deleted

## 2017-10-26 VITALS — BP 118/68 | HR 78 | Temp 99.2°F | Resp 14 | Ht 64.0 in | Wt 134.2 lb

## 2017-10-26 DIAGNOSIS — R05 Cough: Secondary | ICD-10-CM

## 2017-10-26 DIAGNOSIS — J0140 Acute pansinusitis, unspecified: Secondary | ICD-10-CM

## 2017-10-26 DIAGNOSIS — R059 Cough, unspecified: Secondary | ICD-10-CM

## 2017-10-26 MED ORDER — AZITHROMYCIN 250 MG PO TABS: ORAL_TABLET | ORAL | 0 refills | Status: DC

## 2017-10-26 MED ORDER — HYDROCODONE-HOMATROPINE 5-1.5 MG/5ML PO SYRP: 5.0000 mL | ORAL_SOLUTION | Freq: Three times a day (TID) | ORAL | 0 refills | Status: DC | PRN

## 2017-10-26 MED ORDER — ALBUTEROL SULFATE HFA 108 (90 BASE) MCG/ACT IN AERS: 2.0000 | INHALATION_SPRAY | RESPIRATORY_TRACT | 0 refills | Status: DC | PRN

## 2017-10-26 MED ORDER — BENZONATATE 100 MG PO CAPS: 100.0000 mg | ORAL_CAPSULE | Freq: Three times a day (TID) | ORAL | 0 refills | Status: DC | PRN

## 2017-10-26 MED ORDER — AMOXICILLIN 875 MG PO TABS: 875.0000 mg | ORAL_TABLET | Freq: Three times a day (TID) | ORAL | 0 refills | Status: DC

## 2017-10-26 MED ORDER — LEVOFLOXACIN 750 MG PO TABS: 750.0000 mg | ORAL_TABLET | Freq: Every day | ORAL | 0 refills | Status: DC

## 2017-10-26 NOTE — Progress Notes (Addendum)
The CXR showed possible early pneumonia, which I feel strongly it is given your symptoms.  I would start the 5 days of levaquin and come in for follow up in 5 days. If you would prefer me to switch you to the other antibiotics, it would be amoxicillin TID and Azithro q d for 5 days.   Please let me know what you prefer.    Patient did prefer to start amoxicillin and Zithromax, will follow-up in 5 days

## 2017-10-26 NOTE — Patient Instructions (Signed)
Suspicious for possible pneumonia, get chest xray and start antibiotic.     The Levaquin has a new black box warning because its associated with potentially irreversible adverse reactions such as tendinitis and tendon rupture, peripheral neuropathy and central nervous system effects.  Recommended antibiotic for possible pneumonia with significant comorbidities, such as you with immunosuppression, is Levaquin.  Alternative treatments is Augmentin plus another antibiotic either Z-Pak or doxycycline.  Given your GI history I would prefer to do 5 days of Levaquin and reevaluate you to see if we can stop the medicine, if you are improved.

## 2017-10-26 NOTE — Progress Notes (Signed)
Patient ID: Annette Barker, female    DOB: 25-Apr-1946, 72 y.o.   MRN: [id number]  PCP: Susy Frizzle, MD  Chief Complaint  Patient presents with  . Cough    dry symptoms started monday   . low grade fever    Subjective:   Annette Barker is a 72 y.o. female, is a presents to clinic with CC of productive cough x4 days with fever, sweats and congestion and pressure.  T-max has been 102.  She states that cough is been mildly productive, without shortness of breath but she occasionally feels tight in her chest.  She has sinus pain and pressure with associated headache in her forehead and behind her eyes.  She feels generally ill with mild fatigue and malaise.  She denies sore throat, stiff neck, chest pain, abdominal pain, nausea, vomiting, diarrhea, near-syncope, orthopnea, PND, wheeze, lower extremity edema, palpitations, syncope.  She has a history of chronic prednisone use, currently is out of 4 mg dose, has been taking it since last February for PMR.   Patient Active Problem List   Diagnosis Date Noted  . Anxiety   . Elevated cholesterol   . Osteoporosis 03/24/2015  . Left ovarian cyst 09/24/2014  . Osteopenia   . Vaginal dryness   . Atrophic vaginitis   . IBS (irritable bowel syndrome)   . Thyroid disease   . EDEMA 04/20/2010     Prior to Admission medications   Medication Sig Start Date End Date Taking? Authorizing Provider  alendronate (FOSAMAX) 70 MG tablet Take 1 tablet (70 mg total) by mouth every 7 (seven) days. Take with a full glass of water on an empty stomach. 04/27/17  Yes Fontaine, Belinda Block, MD  diazepam (VALIUM) 5 MG tablet Take 1 tablet (5 mg total) by mouth at bedtime. 05/09/17  Yes Susy Frizzle, MD  Estradiol (ESTROGEL) 0.75 MG/1.25 GM (0.06%) topical gel Place 1.25 g onto the skin daily. Patient taking differently: Place 1.25 g onto the skin every other day.  12/19/16  Yes Fontaine, Belinda Block, MD  levothyroxine (SYNTHROID, LEVOTHROID) 75 MCG  tablet Take 1 tablet (75 mcg total) by mouth daily before breakfast. 04/20/17  Yes Pickard, Cammie Mcgee, MD  predniSONE (DELTASONE) 10 MG tablet Take 1 tablet (10 mg total) by mouth daily with breakfast. 01/09/17  Yes Susy Frizzle, MD  progesterone (PROMETRIUM) 100 MG capsule Take 1 capsule (100 mg total) by mouth at bedtime. Patient taking differently: Take 100 mg by mouth at bedtime. 2 per week 12/18/15  Yes Fontaine, Belinda Block, MD     Allergies  Allergen Reactions  . Statins Other (See Comments)    Body cramps all over body     Family History  Problem Relation Age of Onset  . Alzheimer's disease Mother   . Arthritis Mother   . Osteoporosis Father   . Cancer Father        leukemia  . Arthritis Father   . Hyperlipidemia Father   . Cancer Sister        PERITONEAL CANCER  . Arthritis Sister   . Heart disease Sister   . Arthritis Sister   . Alcohol abuse Sister   . Colitis Son   . Anorexia nervosa Grandchild      Social History   Socioeconomic History  . Marital status: Married    Spouse name: Not on file  . Number of children: Not on file  . Years of education: Not on file  .  Highest education level: Not on file  Occupational History  . Not on file  Social Needs  . Financial resource strain: Not on file  . Food insecurity:    Worry: Not on file    Inability: Not on file  . Transportation needs:    Medical: Not on file    Non-medical: Not on file  Tobacco Use  . Smoking status: Former Smoker    Types: Cigarettes    Last attempt to quit: 05/27/1991    Years since quitting: 26.4  . Smokeless tobacco: Never Used  Substance and Sexual Activity  . Alcohol use: Yes    Alcohol/week: 0.0 oz    Comment: rare  . Drug use: No  . Sexual activity: Yes    Birth control/protection: Post-menopausal    Comment: 1st intercourse 72 yo--Fewer than 5 partners  Lifestyle  . Physical activity:    Days per week: Not on file    Minutes per session: Not on file  . Stress: Not  on file  Relationships  . Social connections:    Talks on phone: Not on file    Gets together: Not on file    Attends religious service: Not on file    Active member of club or organization: Not on file    Attends meetings of clubs or organizations: Not on file    Relationship status: Not on file  . Intimate partner violence:    Fear of current or ex partner: Not on file    Emotionally abused: Not on file    Physically abused: Not on file    Forced sexual activity: Not on file  Other Topics Concern  . Not on file  Social History Narrative  . Not on file     Review of Systems  Constitutional: Positive for activity change, fatigue and fever. Negative for unexpected weight change.  HENT: Positive for congestion.   Eyes: Negative.   Respiratory: Positive for cough and chest tightness. Negative for apnea, shortness of breath, wheezing and stridor.   Cardiovascular: Negative.  Negative for chest pain, palpitations and leg swelling.  Gastrointestinal: Negative.   Endocrine: Negative.   Genitourinary: Negative.   Musculoskeletal: Negative.   Skin: Negative.   Allergic/Immunologic: Negative.   Neurological: Negative.   Hematological: Negative.   Psychiatric/Behavioral: Negative.   All other systems reviewed and are negative.      Objective:    Vitals:   10/26/17 1123  BP: 118/68  Pulse: 78  Resp: 14  Temp: 99.2 F (37.3 C)  TempSrc: Oral  SpO2: 95%  Weight: 134 lb 3.2 oz (60.9 kg)  Height: 5\' 4"  (1.626 m)      Physical Exam  Constitutional: She is oriented to person, place, and time. She appears well-developed and well-nourished.  Non-toxic appearance. No distress. She is not intubated.  HENT:  Head: Normocephalic and atraumatic. Head is without right periorbital erythema and without left periorbital erythema.  Right Ear: Tympanic membrane, external ear and ear canal normal.  Left Ear: Tympanic membrane, external ear and ear canal normal.  Nose: Mucosal edema  present. Right sinus exhibits frontal sinus tenderness. Right sinus exhibits no maxillary sinus tenderness. Left sinus exhibits frontal sinus tenderness. Left sinus exhibits no maxillary sinus tenderness.  Mouth/Throat: Uvula is midline, oropharynx is clear and moist and mucous membranes are normal. Mucous membranes are not pale, not dry and not cyanotic. No oropharyngeal exudate, posterior oropharyngeal edema, posterior oropharyngeal erythema or tonsillar abscesses.  Eyes: Pupils are equal, round, and  reactive to light. Conjunctivae, EOM and lids are normal.  Neck: Normal range of motion and phonation normal. Neck supple. No tracheal deviation present.  Cardiovascular: Normal rate, regular rhythm, normal heart sounds and normal pulses. Exam reveals no gallop and no friction rub.  No murmur heard. Pulses:      Radial pulses are 2+ on the right side, and 2+ on the left side.       Posterior tibial pulses are 2+ on the right side, and 2+ on the left side.  Pulmonary/Chest: Effort normal. No accessory muscle usage or stridor. No tachypnea. She is not intubated. No respiratory distress. She has decreased breath sounds in the right middle field, the right lower field, the left middle field and the left lower field. She has no wheezes. She has no rhonchi. She has rales in the right middle field and the right lower field. She exhibits no tenderness.  Abdominal: Soft. Normal appearance and bowel sounds are normal. She exhibits no distension. There is no tenderness. There is no rebound and no guarding.  Musculoskeletal: Normal range of motion. She exhibits no edema or deformity.  Lymphadenopathy:    She has no cervical adenopathy.  Neurological: She is alert and oriented to person, place, and time. She exhibits normal muscle tone. Gait normal.  Skin: Skin is warm, dry and intact. Capillary refill takes less than 2 seconds. No rash noted. She is not diaphoretic. No pallor.  Psychiatric: She has a normal mood  and affect. Her speech is normal and behavior is normal.          Assessment & Plan:      ICD-10-CM   1. Cough R05 DG Chest 2 View    albuterol (VENTOLIN HFA) 108 (90 Base) MCG/ACT inhaler    benzonatate (TESSALON) 100 MG capsule    HYDROcodone-homatropine (HYCODAN) 5-1.5 MG/5ML syrup    DISCONTINUED: levofloxacin (LEVAQUIN) 750 MG tablet  2. Acute non-recurrent pansinusitis J01.40 DISCONTINUED: levofloxacin (LEVAQUIN) 750 MG tablet    Patient with chronic steroid use, has PMR, also has plantar fasciitis, aunts with nasal congestion, sinus pain and pressure with headache, and productive cough with fever, x4 days.  Exam suspicious for pneumonia to the right lower lobe, chest x-ray ordered, discussed indicated antibiotics for community-acquired pneumonia in immunocompromised patient.  Discussed black box warning of Levaquin, which she may want to try because of IBS and concerns for sensitive stomach with antibiotics.  Alternative was high-dose amoxicillin or Augmentin with a Z-Pak or doxycycline.  I printed out black box warning given to the patient, and discussed positive the negatives, patient advised to think about it and let me know which she would like to do.  She went for chest x-ray which was suspicious for infiltrate versus ectasis, given her steroid use, will start treatment and reevaluate closely.  She did prefer to use amoxicillin and azithromycin the Levaquin was canceled.  She was also given Tessalon Perles, cough syrup and albuterol inhaler, did not wish to change steroids unless she has wheeze or shortness of breath.    Patient's vital signs are stable with out respiratory distress or hypoxia, no increased work of breathing, she was mildly ill and nontoxic-appearing.  She can start outpatient treatment with antibiotics however we did discuss concerning signs and symptoms to present to the ER for, she verbalized understanding.  Delsa Grana, PA-C 10/26/17 11:39 AM

## 2017-10-28 ENCOUNTER — Encounter: Payer: Self-pay | Admitting: Family Medicine

## 2017-10-31 ENCOUNTER — Other Ambulatory Visit: Payer: Self-pay

## 2017-10-31 ENCOUNTER — Ambulatory Visit (INDEPENDENT_AMBULATORY_CARE_PROVIDER_SITE_OTHER): Payer: Medicare Other | Admitting: Family Medicine

## 2017-10-31 ENCOUNTER — Encounter: Payer: Self-pay | Admitting: Family Medicine

## 2017-10-31 VITALS — BP 116/64 | HR 71 | Temp 98.4°F | Resp 15 | Ht 64.0 in | Wt 135.2 lb

## 2017-10-31 DIAGNOSIS — R059 Cough, unspecified: Secondary | ICD-10-CM

## 2017-10-31 DIAGNOSIS — R05 Cough: Secondary | ICD-10-CM

## 2017-10-31 DIAGNOSIS — J040 Acute laryngitis: Secondary | ICD-10-CM

## 2017-10-31 DIAGNOSIS — J309 Allergic rhinitis, unspecified: Secondary | ICD-10-CM

## 2017-10-31 MED ORDER — MONTELUKAST SODIUM 10 MG PO TABS: 10.0000 mg | ORAL_TABLET | Freq: Every day | ORAL | 3 refills | Status: DC

## 2017-10-31 MED ORDER — FLUTICASONE PROPIONATE 50 MCG/ACT NA SUSP: 2.0000 | Freq: Every day | NASAL | 6 refills | Status: DC

## 2017-10-31 MED ORDER — IPRATROPIUM-ALBUTEROL 0.5-2.5 (3) MG/3ML IN SOLN
3.0000 mL | Freq: Once | RESPIRATORY_TRACT | Status: AC
  Administered 2017-10-31: 3 mL via RESPIRATORY_TRACT

## 2017-10-31 NOTE — Patient Instructions (Addendum)
I would get a steroid nasal spray, start taking Singulair to help with allergic component of upper airways or lungs  I would try the albuterol inhaler for coughing and other wise use the medications over the counter that have been effective for you.  Laryngitis and bronchitis can last 1-3 weeks and can be very irritating.  I would come back to be rechecked if you develop new/recurrent fever, fatigue, new pain or new symptoms.   Laryngitis Laryngitis is swelling (inflammation) of your vocal cords. This causes hoarseness, coughing, loss of voice, sore throat, or a dry throat. When your vocal cords are inflamed, your voice sounds different. Laryngitis can be temporary (acute) or long-term (chronic). Most cases of acute laryngitis improve with time. Chronic laryngitis is laryngitis that lasts for more than three weeks. Follow these instructions at home:  Drink enough fluid to keep your pee (urine) clear or pale yellow.  Breathe in moist air. Use a humidifier if you live in a dry climate.  Take medicines only as told by your doctor.  Do not smoke cigarettes or electronic cigarettes. If you need help quitting, ask your doctor.  Talk as little as possible. Also avoid whispering, which can cause vocal strain.  Write instead of talking. Do this until your voice is back to normal. Contact a doctor if:  You have a fever.  Your pain is worse.  You have trouble swallowing. Get help right away if:  You cough up blood.  You have trouble breathing. This information is not intended to replace advice given to you by your health care provider. Make sure you discuss any questions you have with your health care provider. Document Released: 04/28/2011 Document Revised: 10/15/2015 Document Reviewed: 10/22/2013 Elsevier Interactive Patient Education  2018 [employer].    Upper Respiratory Infection, Adult Most upper respiratory infections (URIs) are caused by a virus. A URI affects the nose,  throat, and upper air passages. The most common type of URI is often called "the common cold." Follow these instructions at home:  Take medicines only as told by your doctor.  Gargle warm saltwater or take cough drops to comfort your throat as told by your doctor.  Use a warm mist humidifier or inhale steam from a shower to increase air moisture. This may make it easier to breathe.  Drink enough fluid to keep your pee (urine) clear or pale yellow.  Eat soups and other clear broths.  Have a healthy diet.  Rest as needed.  Go back to work when your fever is gone or your doctor says it is okay. ? You may need to stay home longer to avoid giving your URI to others. ? You can also wear a face mask and wash your hands often to prevent spread of the virus.  Use your inhaler more if you have asthma.  Do not use any tobacco products, including cigarettes, chewing tobacco, or electronic cigarettes. If you need help quitting, ask your doctor. Contact a doctor if:  You are getting worse, not better.  Your symptoms are not helped by medicine.  You have chills.  You are getting more short of breath.  You have brown or red mucus.  You have yellow or brown discharge from your nose.  You have pain in your face, especially when you bend forward.  You have a fever.  You have puffy (swollen) neck glands.  You have pain while swallowing.  You have white areas in the back of your throat. Get help right  away if:  You have very bad or constant: ? Headache. ? Ear pain. ? Pain in your forehead, behind your eyes, and over your cheekbones (sinus pain). ? Chest pain.  You have long-lasting (chronic) lung disease and any of the following: ? Wheezing. ? Long-lasting cough. ? Coughing up blood. ? A change in your usual mucus.  You have a stiff neck.  You have changes in your: ? Vision. ? Hearing. ? Thinking. ? Mood. This information is not intended to replace advice given to you  by your health care provider. Make sure you discuss any questions you have with your health care provider. Document Released: 10/26/2007 Document Revised: 01/10/2016 Document Reviewed: 08/14/2013 Elsevier Interactive Patient Education  2018 [employer].

## 2017-10-31 NOTE — Progress Notes (Signed)
Patient ID: Annette Barker, female    DOB: Oct 23, 1945, 72 y.o.   MRN: [id number]  PCP: Susy Frizzle, MD  Chief Complaint  Patient presents with  . Cough    Subjective:   Annette Barker is a 72 y.o. female, presents to clinic with CC of raspy voice and continued cough after starting treatment for pneumonia 5 days ago.  She states that her fatigue, fever decreased energy have resolved, but she continues to have irritating cough and raspy voice.  She additionally has had some sensation of burning in her central chest with coughing.  She states that she had allergic reaction to cough syrup which she requested.  She states that [employer] do nothing for her.  She has not gotten the albuterol inhaler.  She has taken and completed the azithromycin and is nearly finished with the amoxicillin.  Has been taking NyQuil and DayQuil and this seems to help with her nighttime coughing symptoms.  She denies any wheeze, chest pain, shortness of breath.     Patient Active Problem List   Diagnosis Date Noted  . Anxiety   . Elevated cholesterol   . Osteoporosis 03/24/2015  . Left ovarian cyst 09/24/2014  . Osteopenia   . Vaginal dryness   . Atrophic vaginitis   . IBS (irritable bowel syndrome)   . Thyroid disease   . EDEMA 04/20/2010     Prior to Admission medications   Medication Sig Start Date End Date Taking? Authorizing Provider  alendronate (FOSAMAX) 70 MG tablet Take 1 tablet (70 mg total) by mouth every 7 (seven) days. Take with a full glass of water on an empty stomach. 04/27/17  Yes Fontaine, Belinda Block, MD  amoxicillin (AMOXIL) 875 MG tablet Take 1 tablet (875 mg total) by mouth 3 (three) times daily. 10/26/17  Yes Delsa Grana, PA-C  diazepam (VALIUM) 5 MG tablet Take 1 tablet (5 mg total) by mouth at bedtime. 05/09/17  Yes Susy Frizzle, MD  Estradiol (ESTROGEL) 0.75 MG/1.25 GM (0.06%) topical gel Place 1.25 g onto the skin daily. Patient taking differently: Place 1.25 g  onto the skin every other day.  12/19/16  Yes Fontaine, Belinda Block, MD  levothyroxine (SYNTHROID, LEVOTHROID) 75 MCG tablet Take 1 tablet (75 mcg total) by mouth daily before breakfast. 04/20/17  Yes Pickard, Cammie Mcgee, MD  predniSONE (DELTASONE) 10 MG tablet Take 1 tablet (10 mg total) by mouth daily with breakfast. 01/09/17  Yes Susy Frizzle, MD  progesterone (PROMETRIUM) 100 MG capsule Take 1 capsule (100 mg total) by mouth at bedtime. Patient taking differently: Take 100 mg by mouth at bedtime. 2 per week 12/18/15  Yes Fontaine, Belinda Block, MD  albuterol (VENTOLIN HFA) 108 (90 Base) MCG/ACT inhaler Inhale 2 puffs into the lungs every 4 (four) hours as needed for wheezing or shortness of breath. Patient not taking: Reported on 10/31/2017 10/26/17   Delsa Grana, PA-C  benzonatate (TESSALON) 100 MG capsule Take 1 capsule (100 mg total) by mouth 3 (three) times daily as needed for cough. Patient not taking: Reported on 10/31/2017 10/26/17   Delsa Grana, PA-C     Allergies  Allergen Reactions  . Hydromet [Hydrocodone-Homatropine] Itching and Swelling  . Statins Other (See Comments)    Body cramps all over body     Family History  Problem Relation Age of Onset  . Alzheimer's disease Mother   . Arthritis Mother   . Osteoporosis Father   . Cancer Father  leukemia  . Arthritis Father   . Hyperlipidemia Father   . Cancer Sister        PERITONEAL CANCER  . Arthritis Sister   . Heart disease Sister   . Arthritis Sister   . Alcohol abuse Sister   . Colitis Son   . Anorexia nervosa Grandchild      Social History   Socioeconomic History  . Marital status: Married    Spouse name: Not on file  . Number of children: Not on file  . Years of education: Not on file  . Highest education level: Not on file  Occupational History  . Not on file  Social Needs  . Financial resource strain: Not on file  . Food insecurity:    Worry: Not on file    Inability: Not on file  .  Transportation needs:    Medical: Not on file    Non-medical: Not on file  Tobacco Use  . Smoking status: Former Smoker    Types: Cigarettes    Last attempt to quit: 05/27/1991    Years since quitting: 26.4  . Smokeless tobacco: Never Used  Substance and Sexual Activity  . Alcohol use: Yes    Alcohol/week: 0.0 oz    Comment: rare  . Drug use: No  . Sexual activity: Yes    Birth control/protection: Post-menopausal    Comment: 1st intercourse 72 yo--Fewer than 5 partners  Lifestyle  . Physical activity:    Days per week: Not on file    Minutes per session: Not on file  . Stress: Not on file  Relationships  . Social connections:    Talks on phone: Not on file    Gets together: Not on file    Attends religious service: Not on file    Active member of club or organization: Not on file    Attends meetings of clubs or organizations: Not on file    Relationship status: Not on file  . Intimate partner violence:    Fear of current or ex partner: Not on file    Emotionally abused: Not on file    Physically abused: Not on file    Forced sexual activity: Not on file  Other Topics Concern  . Not on file  Social History Narrative  . Not on file     Review of Systems  Constitutional: Negative for activity change, appetite change, chills, diaphoresis, fatigue, fever and unexpected weight change.  HENT: Positive for congestion, postnasal drip and voice change. Negative for rhinorrhea, sinus pressure, sinus pain, sneezing, sore throat and trouble swallowing.   Eyes: Negative.   Respiratory: Positive for cough. Negative for apnea, choking, chest tightness, shortness of breath, wheezing and stridor.   Cardiovascular: Negative.  Negative for chest pain, palpitations and leg swelling.  Gastrointestinal: Negative.   Endocrine: Negative.   Genitourinary: Negative.   Musculoskeletal: Negative.   Allergic/Immunologic: Negative.   Neurological: Negative.   Hematological: Negative.     Psychiatric/Behavioral: Negative.   All other systems reviewed and are negative.      Objective:    Vitals:   10/31/17 1513  BP: 116/64  Pulse: 71  Resp: 15  Temp: 98.4 F (36.9 C)  SpO2: 95%  Weight: 135 lb 4 oz (61.3 kg)  Height: 5\' 4"  (1.626 m)      Physical Exam  Constitutional: She is oriented to person, place, and time. She appears well-developed and well-nourished.  Non-toxic appearance. No distress.  HENT:  Head: Normocephalic and atraumatic.  Right Ear: External ear normal.  Left Ear: External ear normal.  Mouth/Throat: Uvula is midline, oropharynx is clear and moist and mucous membranes are normal. No oropharyngeal exudate.  Nasal turbinates enlarged, boggy and pale bilaterally Raspy voice Posterior oropharynx normal  Eyes: Pupils are equal, round, and reactive to light. Conjunctivae, EOM and lids are normal. Right eye exhibits no discharge. Left eye exhibits no discharge. No scleral icterus.  Neck: Normal range of motion and phonation normal. Neck supple. No tracheal deviation present.  Cardiovascular: Normal rate, regular rhythm, normal heart sounds, intact distal pulses and normal pulses. Exam reveals no gallop and no friction rub.  No murmur heard. Pulses:      Radial pulses are 2+ on the right side, and 2+ on the left side.       Posterior tibial pulses are 2+ on the right side, and 2+ on the left side.  Pulmonary/Chest: Effort normal and breath sounds normal. No stridor. No respiratory distress. She has no wheezes. She has no rhonchi. She has no rales. She exhibits no tenderness.  Abdominal: Soft. Normal appearance and bowel sounds are normal. She exhibits no distension. There is no tenderness. There is no rebound and no guarding.  Musculoskeletal: Normal range of motion. She exhibits no edema or deformity.  Lymphadenopathy:    She has no cervical adenopathy.  Neurological: She is alert and oriented to person, place, and time. She exhibits normal muscle  tone. Coordination and gait normal.  Skin: Skin is warm, dry and intact. Capillary refill takes less than 2 seconds. No rash noted. She is not diaphoretic. No pallor.  Psychiatric: She has a normal mood and affect. Her speech is normal and behavior is normal.  Nursing note and vitals reviewed.         Assessment & Plan:      ICD-10-CM   1. Cough R05 ipratropium-albuterol (DUONEB) 0.5-2.5 (3) MG/3ML nebulizer solution 3 mL  2. Laryngitis J04.0   3. Allergic rhinitis, unspecified seasonality, unspecified trigger J30.9     Patient returns for recheck of pneumonia, is improving, complains of continued cough, also has raspy voice.  Feels she is significantly improving, no more fever fatigue or decreased activity.  She found Tessalon Perles to be ineffective, and had an allergic reaction to the cough syrup.  Did not get the albuterol inhaler.    DuoNeb treatment given in clinic, she reevaluated afterwards, lungs CTA A&P, she did not notice much difference in her coughing, she currently does not have any other burning sensation in her upper chest.    On exam she appears to have some baseline allergies, will start treatment for this, reassured patient that laryngitis and continued cough can sometimes be prolonged, as long as she is without fever, pain, shortness of breath wheeze or chest tightness, supportive and symptomatic treatment as indicated.  She requested to see a physician, so Dr. Dennard Schaumann was kind enough to come into the room and see his patient.  Please see his documentation for any details.  Patient's allergies will be treated with montelukast and nasal spray, she is to continue over-the-counter medications that were effective, for her cough.  Still feel that albuterol may be helpful when she has any central chest burning or tightness with severe coughing fits.  Did not feel like we need to adjust her steroids at all or interfere with her taper from rheumatology, because lungs are clear,  has no wheeze, no chest tightness or shortness of breath currently.  Overall she appears  significantly improved from last visit with me, vital signs are stable, well-appearing without any respiratory distress  Delsa Grana, PA-C 10/31/17 3:30 PM

## 2017-11-03 ENCOUNTER — Telehealth: Payer: Self-pay | Admitting: *Deleted

## 2017-11-03 NOTE — Telephone Encounter (Signed)
Patient has been on fosamax 70 tablet since Dec 2018, went to dentist recently and was informed that she has teeth bone loss, states the dentist told that was a side effect of taking fosamax. Pt thought she would relay this information to you. Please advise

## 2017-11-06 DIAGNOSIS — M353 Polymyalgia rheumatica: Secondary | ICD-10-CM | POA: Diagnosis not present

## 2017-11-06 DIAGNOSIS — M2550 Pain in unspecified joint: Secondary | ICD-10-CM | POA: Diagnosis not present

## 2017-11-06 DIAGNOSIS — Z6823 Body mass index (BMI) 23.0-23.9, adult: Secondary | ICD-10-CM | POA: Diagnosis not present

## 2017-11-06 DIAGNOSIS — M810 Age-related osteoporosis without current pathological fracture: Secondary | ICD-10-CM | POA: Diagnosis not present

## 2017-11-06 DIAGNOSIS — Z7952 Long term (current) use of systemic steroids: Secondary | ICD-10-CM | POA: Diagnosis not present

## 2017-11-08 ENCOUNTER — Encounter: Payer: Self-pay | Admitting: *Deleted

## 2017-11-08 NOTE — Telephone Encounter (Signed)
Sent mychart message

## 2017-11-08 NOTE — Telephone Encounter (Signed)
Question is whether this is truly osteonecrosis of the jaw related to the medication versus bone loss related to aging.  Regardless if her dentist told her to stop taking the medication the problem is other medications seem to have the same risks such as Prolia.  It is limiting what we can use to treat her.  I think she is a good candidate for a consultation with Dr. Cruzita Lederer for her recommendations.  I would set up a consult appointment with Dr. Cruzita Lederer

## 2017-11-13 ENCOUNTER — Other Ambulatory Visit: Payer: Self-pay | Admitting: Family Medicine

## 2017-11-13 MED ORDER — DIAZEPAM 5 MG PO TABS: 5.0000 mg | ORAL_TABLET | Freq: Every day | ORAL | 2 refills | Status: DC

## 2017-11-13 NOTE — Telephone Encounter (Signed)
Requesting refill    Valium  LOV: 10/31/17  LRF:  05/09/18

## 2017-11-17 ENCOUNTER — Other Ambulatory Visit: Payer: Self-pay | Admitting: *Deleted

## 2017-11-24 NOTE — Telephone Encounter (Signed)
Spoke patient because my chart message came back un-read. I told her what Dr. Phineas Real said and patient said since then the dentist called back and told her the same he can't tell if bone loss came from aging or medication. Patient said declined referral for now and will continue on fosamax medication.

## 2017-12-04 DIAGNOSIS — H25813 Combined forms of age-related cataract, bilateral: Secondary | ICD-10-CM | POA: Diagnosis not present

## 2017-12-04 DIAGNOSIS — H40013 Open angle with borderline findings, low risk, bilateral: Secondary | ICD-10-CM | POA: Diagnosis not present

## 2017-12-07 ENCOUNTER — Encounter: Payer: Self-pay | Admitting: Family Medicine

## 2017-12-07 ENCOUNTER — Other Ambulatory Visit: Payer: Self-pay | Admitting: *Deleted

## 2017-12-07 MED ORDER — DIAZEPAM 5 MG PO TABS: 5.0000 mg | ORAL_TABLET | Freq: Every day | ORAL | 2 refills | Status: DC

## 2017-12-07 NOTE — Telephone Encounter (Signed)
Received e-mail from patient.   Requested refill on Diazepam.   Ok to refill??  Last office visit 10/31/2017.  Last refill 11/13/2017.

## 2017-12-13 ENCOUNTER — Telehealth: Payer: Self-pay | Admitting: Family Medicine

## 2017-12-13 NOTE — Telephone Encounter (Signed)
Pt called and would like to know what your recommendations are for her plantar fasciitis - your note states that she needs cortisone however she does not want a cortisone injection as she says the last time she got one in her arm it made her violently sick. Her rheumatologist suggested to her that she may need to see PT as this helped her in the past. Pt is wondering if she needs to see a podiatrists? Please advise.  Also her valium was sent to wrong pharm - can you resend this.

## 2017-12-14 MED ORDER — DIAZEPAM 5 MG PO TABS: 5.0000 mg | ORAL_TABLET | Freq: Every day | ORAL | 2 refills | Status: DC

## 2017-12-14 NOTE — Telephone Encounter (Signed)
I would recommend podiatry consult.  Stretching with PT is also reasonable.  I would go to podiatry (personally) but if she prefers PT that's fine.

## 2017-12-15 DIAGNOSIS — M722 Plantar fascial fibromatosis: Secondary | ICD-10-CM

## 2017-12-15 NOTE — Telephone Encounter (Signed)
Patient aware of providers recommendations. [employer]

## 2017-12-28 ENCOUNTER — Ambulatory Visit (INDEPENDENT_AMBULATORY_CARE_PROVIDER_SITE_OTHER): Payer: Medicare Other | Admitting: Podiatry

## 2017-12-28 ENCOUNTER — Encounter: Payer: Self-pay | Admitting: Podiatry

## 2017-12-28 DIAGNOSIS — M722 Plantar fascial fibromatosis: Secondary | ICD-10-CM | POA: Diagnosis not present

## 2017-12-28 MED ORDER — TRIAMCINOLONE ACETONIDE 10 MG/ML IJ SUSP
10.0000 mg | Freq: Once | INTRAMUSCULAR | Status: AC
Start: 2017-12-28 — End: 2017-12-28
  Administered 2017-12-28: 10 mg

## 2017-12-28 NOTE — Progress Notes (Signed)
Subjective:   Patient ID: Annette Barker, female   DOB: 72 y.o.   MRN: [id number]   HPI Patient presents with exquisite discomfort plantar aspect right heel of 6 months duration and patient states that she is tried exercises ice therapy and shoe gear modifications.  Patient does not smoke and likes to be active   Review of Systems  All other systems reviewed and are negative.       Objective:  Physical Exam  Constitutional: She appears well-developed and well-nourished.  Cardiovascular: Intact distal pulses.  Pulmonary/Chest: Effort normal.  Musculoskeletal: Normal range of motion.  Neurological: She is alert.  Skin: Skin is warm.  Nursing note and vitals reviewed.   Neurovascular status intact muscle strength adequate range of motion within normal limits with exquisite discomfort plantar aspect right heel at the insertional point of the tendon into the calcaneus with inflammation and fluid around the medial band.  Patient is found to have good digital perfusion well oriented x3     Assessment:  Acute plantar fasciitis right with inflammation     Plan:  Reviewed previous x-rays indicating spur formation and did H&P today.  I then injected the plantar fascia right 3 mg Kenalog 5 mg Xylocaine and applied fascial brace with instructions on usage and also supportive shoe gear.  I advised on higher heeled shoes and patient will be seen back 2 weeks and may require shockwave therapy or other treatment that she did ask about today

## 2017-12-28 NOTE — Patient Instructions (Signed)

## 2018-01-10 ENCOUNTER — Ambulatory Visit (INDEPENDENT_AMBULATORY_CARE_PROVIDER_SITE_OTHER): Payer: Medicare Other | Admitting: Podiatry

## 2018-01-10 ENCOUNTER — Encounter: Payer: Self-pay | Admitting: Podiatry

## 2018-01-10 DIAGNOSIS — M722 Plantar fascial fibromatosis: Secondary | ICD-10-CM | POA: Diagnosis not present

## 2018-01-10 MED ORDER — TRIAMCINOLONE ACETONIDE 10 MG/ML IJ SUSP
10.0000 mg | Freq: Once | INTRAMUSCULAR | Status: AC
  Administered 2018-01-10: 10 mg

## 2018-01-11 ENCOUNTER — Ambulatory Visit (INDEPENDENT_AMBULATORY_CARE_PROVIDER_SITE_OTHER): Payer: Medicare Other | Admitting: Gynecology

## 2018-01-11 ENCOUNTER — Encounter: Payer: Self-pay | Admitting: Gynecology

## 2018-01-11 VITALS — BP 118/76

## 2018-01-11 DIAGNOSIS — N764 Abscess of vulva: Secondary | ICD-10-CM | POA: Diagnosis not present

## 2018-01-11 NOTE — Patient Instructions (Signed)
Continue with sitz bath and warm compresses to the affected area.  Follow-up if this area does not completely resolve or if increasing pain, swelling, fevers or redness in the area.

## 2018-01-11 NOTE — Progress Notes (Signed)
    Annette Barker Aug 09, 1945 [id number]        72 y.o.  [id number] resents with a week's worth of worsening vulvar pain and swelling.  Has been using sitz bath's over the last day or so but notes the swelling has worsened.  Past medical history,surgical history, problem list, medications, allergies, family history and social history were all reviewed and documented in the EPIC chart.  Directed ROS with pertinent positives and negatives documented in the history of present illness/assessment and plan.  Exam: [name] assistant Vitals:   01/11/18 1517  BP: 118/76   General appearance:  Normal External BUS vagina with fluctuant left right lower labia majora boil/abscess approximately 2 to 3 cm across.  Some surrounding induration but no significant cellulitis.  No inguinal adenopathy.  Physical Exam  Genitourinary:      Procedure: The skin overlying and surrounding the pointing area of the boil was cleansed with Betadine and subsequently infiltrated with 1% lidocaine.  A stab incision with a scalpel was made into the most pointing area and pus extruded.  Gentle pressure completely emptied the boil.  Mosquito forceps were then inserted to break up any loculations.  No remaining pus extruded.  Scant bleeding afterwards.  The patient tolerated well.  Assessment/Plan:  72 y.o. [id number] with right vulvar boil/abscess.  Simple I&D of the area was performed.  No packing required.  Postoperative instructions were given to the patient to include sitz bath's and heat to the area.  Signs and symptoms of cellulitis/worsening infection reviewed.  Do not feel at this point antibiotics warranted as I suspect that this will treat the infection with simple I&D.  Patient will follow-up if the area persists, worsens or she develops other symptoms such as fevers increasing pain/erythema in the area.    [name] MD, 3:57 PM 01/11/2018

## 2018-01-11 NOTE — Progress Notes (Signed)
Subjective:   Patient ID: Annette Barker, female   DOB: 72 y.o.   MRN: [id number]   HPI Patient continues to experience pain in the bottom of the right heel and states it seems to be more in the center and lateral side.  Also states the pain is worse when she gets up in the morning and after periods of sitting especially in the a.m. hours   ROS      Objective:  Physical Exam  Neurovascular status intact with patient found to have exquisite discomfort plantar aspect right heel within the mid arch and mid insertion and slightly into the lateral with also problems with stretching the plantar fascia     Assessment:  Acute plantar fasciitis right with inflammation of the central lateral tendon     Plan:  I did dispense a night splint today with all instructions on usage and today I did a lateral injection 3 mg Kenalog 5 mg Xylocaine and instructed on reduced activity and shoe gear modifications.  Reappoint 3 weeks to reevaluate

## 2018-01-31 ENCOUNTER — Ambulatory Visit (INDEPENDENT_AMBULATORY_CARE_PROVIDER_SITE_OTHER): Payer: Medicare Other | Admitting: Podiatry

## 2018-01-31 ENCOUNTER — Encounter: Payer: Self-pay | Admitting: Podiatry

## 2018-01-31 DIAGNOSIS — M722 Plantar fascial fibromatosis: Secondary | ICD-10-CM | POA: Diagnosis not present

## 2018-02-01 NOTE — Progress Notes (Signed)
Subjective:   Patient ID: Annette Barker, female   DOB: 72 y.o.   MRN: [id number]   HPI Patient states continues to improve and states that the night splint has been very beneficial   ROS      Objective:  Physical Exam  Neurovascular status intact with patient's heel improving but still painful with palpation     Assessment:  Fasciitis that improved but is still present     Plan:  H&P condition reviewed and recommended continued anti-inflammatory activity supportive shoes and night splint usage.  Do not recommend anything more aggressive currently

## 2018-03-12 ENCOUNTER — Telehealth: Payer: Self-pay | Admitting: Family Medicine

## 2018-03-12 NOTE — Telephone Encounter (Signed)
Pt called and states that she had come off prednisone and then the pain came back so she is taking 2 mg of prednisone and wanted to know if you recommended acupuncture as an alternative to the pred?

## 2018-03-12 NOTE — Telephone Encounter (Signed)
Certainly do not believe that there would be any harm in her taking or utilizing acupuncture.

## 2018-03-14 NOTE — Telephone Encounter (Signed)
Pt aware of recommendations

## 2018-03-20 DIAGNOSIS — Z7952 Long term (current) use of systemic steroids: Secondary | ICD-10-CM | POA: Diagnosis not present

## 2018-03-20 DIAGNOSIS — Z6823 Body mass index (BMI) 23.0-23.9, adult: Secondary | ICD-10-CM | POA: Diagnosis not present

## 2018-03-20 DIAGNOSIS — M2550 Pain in unspecified joint: Secondary | ICD-10-CM | POA: Diagnosis not present

## 2018-03-20 DIAGNOSIS — M810 Age-related osteoporosis without current pathological fracture: Secondary | ICD-10-CM | POA: Diagnosis not present

## 2018-03-20 DIAGNOSIS — M353 Polymyalgia rheumatica: Secondary | ICD-10-CM | POA: Diagnosis not present

## 2018-04-08 ENCOUNTER — Other Ambulatory Visit: Payer: Self-pay | Admitting: Gynecology

## 2018-04-18 DIAGNOSIS — Z23 Encounter for immunization: Secondary | ICD-10-CM | POA: Diagnosis not present

## 2018-04-24 ENCOUNTER — Other Ambulatory Visit: Payer: Self-pay | Admitting: Family Medicine

## 2018-05-05 ENCOUNTER — Other Ambulatory Visit: Payer: Self-pay | Admitting: Gynecology

## 2018-05-11 ENCOUNTER — Telehealth: Payer: Self-pay | Admitting: *Deleted

## 2018-05-11 MED ORDER — ALENDRONATE SODIUM 70 MG PO TABS: ORAL_TABLET | ORAL | 0 refills | Status: DC

## 2018-05-11 NOTE — Telephone Encounter (Signed)
Appointment schedule for February 4.

## 2018-05-11 NOTE — Telephone Encounter (Signed)
Rx sent with only 1 month supply, patient will have new pharmacy in Jan 2020

## 2018-05-11 NOTE — Telephone Encounter (Signed)
Patient called requesting refill on Fosamax 70mg  tablet, overdue for annual exam, can you call to schedule then Rx can be sent to pharmacy.

## 2018-05-28 ENCOUNTER — Telehealth: Payer: Self-pay | Admitting: Family Medicine

## 2018-05-28 DIAGNOSIS — L249 Irritant contact dermatitis, unspecified cause: Secondary | ICD-10-CM

## 2018-05-28 NOTE — Telephone Encounter (Signed)
Refill on mometasone cream to [facility].

## 2018-05-29 MED ORDER — MOMETASONE FUROATE 0.1 % EX OINT: TOPICAL_OINTMENT | Freq: Every day | CUTANEOUS | 0 refills | Status: DC

## 2018-05-29 NOTE — Telephone Encounter (Signed)
Medication called/sent to requested pharmacy  

## 2018-05-31 ENCOUNTER — Other Ambulatory Visit: Payer: Self-pay | Admitting: *Deleted

## 2018-05-31 MED ORDER — ALENDRONATE SODIUM 70 MG PO TABS: ORAL_TABLET | ORAL | 1 refills | Status: DC

## 2018-05-31 NOTE — Telephone Encounter (Signed)
annual exam scheduled on 06/26/18

## 2018-06-01 ENCOUNTER — Other Ambulatory Visit: Payer: Self-pay | Admitting: Family Medicine

## 2018-06-01 MED ORDER — LEVOTHYROXINE SODIUM 75 MCG PO TABS: ORAL_TABLET | ORAL | 2 refills | Status: DC

## 2018-06-20 DIAGNOSIS — Z6823 Body mass index (BMI) 23.0-23.9, adult: Secondary | ICD-10-CM | POA: Diagnosis not present

## 2018-06-20 DIAGNOSIS — M2550 Pain in unspecified joint: Secondary | ICD-10-CM | POA: Diagnosis not present

## 2018-06-20 DIAGNOSIS — M810 Age-related osteoporosis without current pathological fracture: Secondary | ICD-10-CM | POA: Diagnosis not present

## 2018-06-20 DIAGNOSIS — M353 Polymyalgia rheumatica: Secondary | ICD-10-CM | POA: Diagnosis not present

## 2018-06-20 DIAGNOSIS — Z7952 Long term (current) use of systemic steroids: Secondary | ICD-10-CM | POA: Diagnosis not present

## 2018-06-25 DIAGNOSIS — Z1231 Encounter for screening mammogram for malignant neoplasm of breast: Secondary | ICD-10-CM | POA: Diagnosis not present

## 2018-06-25 LAB — HM MAMMOGRAPHY

## 2018-06-26 ENCOUNTER — Encounter: Payer: Self-pay | Admitting: Gynecology

## 2018-06-26 ENCOUNTER — Ambulatory Visit (INDEPENDENT_AMBULATORY_CARE_PROVIDER_SITE_OTHER): Payer: Medicare Other | Admitting: Gynecology

## 2018-06-26 VITALS — BP 122/76 | Ht 64.0 in | Wt 138.0 lb

## 2018-06-26 DIAGNOSIS — Z01419 Encounter for gynecological examination (general) (routine) without abnormal findings: Secondary | ICD-10-CM | POA: Diagnosis not present

## 2018-06-26 DIAGNOSIS — M810 Age-related osteoporosis without current pathological fracture: Secondary | ICD-10-CM

## 2018-06-26 DIAGNOSIS — Z79890 Hormone replacement therapy (postmenopausal): Secondary | ICD-10-CM

## 2018-06-26 DIAGNOSIS — N952 Postmenopausal atrophic vaginitis: Secondary | ICD-10-CM

## 2018-06-26 MED ORDER — ALENDRONATE SODIUM 70 MG PO TABS: ORAL_TABLET | ORAL | 12 refills | Status: DC

## 2018-06-26 MED ORDER — ESTRADIOL 0.75 MG/1.25 GM (0.06%) TD GEL: 1.2500 g | Freq: Every day | TRANSDERMAL | 6 refills | Status: DC

## 2018-06-26 MED ORDER — PROGESTERONE MICRONIZED 100 MG PO CAPS: 100.0000 mg | ORAL_CAPSULE | Freq: Every day | ORAL | 4 refills | Status: DC

## 2018-06-26 NOTE — Patient Instructions (Signed)
Call if any issues after restarting your progesterone along with your estrogen

## 2018-06-26 NOTE — Progress Notes (Signed)
    Annette Barker 1945/06/27 [id number]        72 y.o.  [id number] for breast and pelvic exam.  Several issues noted below.  Past medical history,surgical history, problem list, medications, allergies, family history and social history were all reviewed and documented as reviewed in the EPIC chart.  ROS:  Performed with pertinent positives and negatives included in the history, assessment and plan.   Additional significant findings : None   Exam: [name] assistant Vitals:   06/26/18 1416  BP: 122/76  Weight: 138 lb (62.6 kg)  Height: 5\' 4"  (1.626 m)   Body mass index is 23.69 kg/m.  General appearance:  Normal affect, orientation and appearance. Skin: Grossly normal HEENT: Without gross lesions.  No cervical or supraclavicular adenopathy. Thyroid normal.  Lungs:  Clear without wheezing, rales or rhonchi Cardiac: RR, without RMG Abdominal:  Soft, nontender, without masses, guarding, rebound, organomegaly or hernia Breasts:  Examined lying and sitting without masses, retractions, discharge or axillary adenopathy. Pelvic:  Ext, BUS, Vagina: With atrophic changes  Cervix: With atrophic changes  Uterus: Anteverted, normal size, shape and contour, midline and mobile nontender   Adnexa: Without masses or tenderness    Anus and perineum: Normal   Rectovaginal: Normal sphincter tone without palpated masses or tenderness.    Assessment/Plan:  73 y.o. [id number] female for breast and pelvic exam  1. Postmenopausal/HRT.  Patient is using Estrogel every other day.  Had been on Prometrium 100 mg at bedtime but stopped it this past year because she was feeling more aggressive while using this and taking steroids.  Has done no bleeding.  We discussed the whole issue of HRT with risks versus benefits to include the risks of thrombosis such as stroke heart attack DVT in the breast cancer issue.  We discussed the issues of unopposed estrogen with endometrial stimulation from hyperplasia to  uterine cancer.  Options for management at this point were reviewed to include weaning off of estrogen altogether, adding back the progesterone, endometrial assessment now either ultrasound endometrial echo versus biopsy.  The issues of adding back progesterone now with no endometrial assessment is the risk of hyperplasia/atypia/cancer now although unlikely undiagnosed.  Also recognizing if hyperplasia that adding back progesterone with a low dose every other day estrogen would treat this more than likely.  After lengthy discussion the patient declines further evaluation is comfortable adding back the progesterone now as she does not want to go without estrogen and excepting the risk of undiagnosed endometrial pathology.  Estrogel and Prometrium represcribed x1 year. 2. Osteoporosis.  DEXA 03/2017 T score -3.2.  Started on alendronate last year and is done well.  Refill x1 year provided.  Will repeat DEXA next year at 2-year interval. 3. Mammography yesterday.  Breast exam normal today. 4. Colonoscopy 2017.  Repeat at their recommended interval. 5. Pap smear 2016.  No Pap smear done today.  No history of abnormal Pap smears.  Current screening guidelines reviewed and she is comfortable with no further screening based on age. 6. Health maintenance.  No routine lab work done as patient does this elsewhere.  Follow-up 1 year, sooner as needed.   [name] MD, 2:55 PM 06/26/2018

## 2018-06-28 ENCOUNTER — Encounter: Payer: Self-pay | Admitting: *Deleted

## 2018-06-29 ENCOUNTER — Other Ambulatory Visit: Payer: Self-pay | Admitting: Family Medicine

## 2018-06-29 MED ORDER — DIAZEPAM 5 MG PO TABS: 5.0000 mg | ORAL_TABLET | Freq: Every day | ORAL | 2 refills | Status: DC

## 2018-06-29 NOTE — Telephone Encounter (Signed)
Requesting refill    Valium  LOV: 08/17/17  LRF:  12/14/17 #30/2

## 2018-06-29 NOTE — Addendum Note (Signed)
Addended by: Shary Decamp B on: 06/29/2018 10:20 AM   Modules accepted: Orders

## 2018-06-29 NOTE — Telephone Encounter (Signed)
Patient is calling to tell us that her pharmacy has changed to [facility]  She needs a refill on her diazapam to be sent there

## 2018-06-29 NOTE — Telephone Encounter (Signed)
Transmission failed please resend 

## 2018-07-03 ENCOUNTER — Other Ambulatory Visit: Payer: Self-pay | Admitting: Family Medicine

## 2018-07-03 MED ORDER — DIAZEPAM 5 MG PO TABS: 5.0000 mg | ORAL_TABLET | Freq: Every day | ORAL | 2 refills | Status: DC

## 2018-07-03 NOTE — Telephone Encounter (Signed)
Please resend  - pharm was without power and did not receive.

## 2018-07-03 NOTE — Telephone Encounter (Signed)
Pts valium script did not go through the pharmacy was without power and never received the request can we please resend to [facility].

## 2018-07-04 ENCOUNTER — Telehealth: Payer: Self-pay | Admitting: *Deleted

## 2018-07-04 NOTE — Telephone Encounter (Signed)
[employer] request coverage determination for estrogel 0.75 mg (0.06%) gel medication approved effective 07/03/18.

## 2018-08-07 ENCOUNTER — Telehealth: Payer: Self-pay | Admitting: Family Medicine

## 2018-08-07 NOTE — Telephone Encounter (Signed)
Pt wants to know what she can do for her nasal dripage and cough otc.   She thinks that it is sinus related but she is tired of people looking at her like she has coronavirus.

## 2018-08-07 NOTE — Telephone Encounter (Signed)
Try xyzal 5 mg poqday

## 2018-08-08 MED ORDER — LEVOCETIRIZINE DIHYDROCHLORIDE 5 MG PO TABS: 5.0000 mg | ORAL_TABLET | Freq: Every evening | ORAL | 5 refills | Status: DC

## 2018-08-08 NOTE — Telephone Encounter (Signed)
Pt aware of recommendation and med sent to pharm

## 2018-08-21 ENCOUNTER — Telehealth: Payer: Self-pay | Admitting: *Deleted

## 2018-08-21 MED ORDER — MEDROXYPROGESTERONE ACETATE 2.5 MG PO TABS: 2.5000 mg | ORAL_TABLET | Freq: Every day | ORAL | 3 refills | Status: DC

## 2018-08-21 NOTE — Telephone Encounter (Signed)
Patient aware, Rx sent.  

## 2018-08-21 NOTE — Telephone Encounter (Signed)
Patient called stating her insurance is no longer going to cover the Prometrium 100 mg tablet that she takes along with estrogel 0.06%. she asked if any other options? Please advise

## 2018-08-21 NOTE — Telephone Encounter (Signed)
Provera 2.5 mg daily

## 2018-09-18 DIAGNOSIS — M2550 Pain in unspecified joint: Secondary | ICD-10-CM | POA: Diagnosis not present

## 2018-09-18 DIAGNOSIS — Z7952 Long term (current) use of systemic steroids: Secondary | ICD-10-CM | POA: Diagnosis not present

## 2018-09-18 DIAGNOSIS — M810 Age-related osteoporosis without current pathological fracture: Secondary | ICD-10-CM | POA: Diagnosis not present

## 2018-09-18 DIAGNOSIS — M353 Polymyalgia rheumatica: Secondary | ICD-10-CM | POA: Diagnosis not present

## 2018-09-21 ENCOUNTER — Encounter: Payer: Self-pay | Admitting: Family Medicine

## 2018-09-25 ENCOUNTER — Encounter: Payer: Medicare Other | Admitting: Family Medicine

## 2018-10-01 ENCOUNTER — Encounter: Payer: Self-pay | Admitting: Family Medicine

## 2018-10-02 ENCOUNTER — Ambulatory Visit: Payer: Medicare Other

## 2018-10-02 ENCOUNTER — Other Ambulatory Visit: Payer: Self-pay

## 2018-10-02 DIAGNOSIS — Z20818 Contact with and (suspected) exposure to other bacterial communicable diseases: Secondary | ICD-10-CM

## 2018-10-02 NOTE — Progress Notes (Signed)
Patient came in for nasal swab for exposure to MRSA. Swab was obtained and given to lab tech.

## 2018-10-04 ENCOUNTER — Encounter: Payer: Self-pay | Admitting: Family Medicine

## 2018-10-05 LAB — MRSA CULTURE
MICRO NUMBER:: 466314
SPECIMEN QUALITY:: ADEQUATE

## 2018-10-08 ENCOUNTER — Encounter: Payer: Self-pay | Admitting: Family Medicine

## 2018-10-08 ENCOUNTER — Other Ambulatory Visit: Payer: Self-pay | Admitting: Family Medicine

## 2018-10-09 ENCOUNTER — Other Ambulatory Visit: Payer: Self-pay | Admitting: Family Medicine

## 2018-10-09 MED ORDER — ELUXADOLINE 100 MG PO TABS: 100.0000 mg | ORAL_TABLET | Freq: Two times a day (BID) | ORAL | 1 refills | Status: DC

## 2018-10-10 ENCOUNTER — Other Ambulatory Visit: Payer: Self-pay | Admitting: Family Medicine

## 2018-10-10 ENCOUNTER — Encounter: Payer: Self-pay | Admitting: Family Medicine

## 2018-10-10 MED ORDER — DICYCLOMINE HCL 20 MG PO TABS: 20.0000 mg | ORAL_TABLET | Freq: Four times a day (QID) | ORAL | 0 refills | Status: DC

## 2018-10-15 ENCOUNTER — Other Ambulatory Visit: Payer: Self-pay | Admitting: Family Medicine

## 2018-10-15 ENCOUNTER — Encounter: Payer: Self-pay | Admitting: Family Medicine

## 2018-10-15 DIAGNOSIS — R52 Pain, unspecified: Secondary | ICD-10-CM | POA: Diagnosis not present

## 2018-10-15 DIAGNOSIS — R5383 Other fatigue: Secondary | ICD-10-CM | POA: Diagnosis not present

## 2018-10-15 DIAGNOSIS — R5381 Other malaise: Secondary | ICD-10-CM | POA: Diagnosis not present

## 2018-10-15 DIAGNOSIS — R509 Fever, unspecified: Secondary | ICD-10-CM

## 2018-10-15 DIAGNOSIS — R05 Cough: Secondary | ICD-10-CM | POA: Diagnosis not present

## 2018-10-15 DIAGNOSIS — Z1159 Encounter for screening for other viral diseases: Secondary | ICD-10-CM | POA: Diagnosis not present

## 2018-10-15 DIAGNOSIS — Z20828 Contact with and (suspected) exposure to other viral communicable diseases: Secondary | ICD-10-CM | POA: Diagnosis not present

## 2018-10-15 DIAGNOSIS — R3915 Urgency of urination: Secondary | ICD-10-CM | POA: Diagnosis not present

## 2018-10-16 ENCOUNTER — Telehealth: Payer: Self-pay | Admitting: Family Medicine

## 2018-10-16 ENCOUNTER — Encounter: Payer: Self-pay | Admitting: Family Medicine

## 2018-10-16 ENCOUNTER — Telehealth: Payer: Self-pay | Admitting: *Deleted

## 2018-10-16 ENCOUNTER — Other Ambulatory Visit: Payer: Self-pay

## 2018-10-16 DIAGNOSIS — Z20822 Contact with and (suspected) exposure to covid-19: Secondary | ICD-10-CM

## 2018-10-16 NOTE — Telephone Encounter (Signed)
-----   Message from Susy Frizzle, MD sent at 10/15/2018  9:17 AM EDT ----- I received a call from this patient this morning.  She is having fever to 100.4.  She has a compromised immune system and is on immunosuppressive therapy for PMR.  She is also having flulike symptoms and I have recommended COVID-19 testing due to her compromised immune system and multiple family members from [place] in her home at the present time due to the death of her husband.  I have instructed her to come to the [facility] for testing and to call you prior to arrival to determine the proper process.  I have also placed the order for the COVID-19 [facility] test.  I hope this is the correct procedure.  My pager is [phone] if you need to reach me. Jenna Luo

## 2018-10-16 NOTE — Telephone Encounter (Signed)
Pt returned call and has been scheduled for the covid-19 test for Wednesday 10/17/2018 at the [facility] test at 10:00 am. Pt advised to stay in car, wear a mask and keep windows rolled up until tested. Pt voiced understanding.

## 2018-10-16 NOTE — Telephone Encounter (Signed)
Attempted to call patient and schedule for covid-19 test. No answer, left message. Will call back to schedule.

## 2018-10-16 NOTE — Telephone Encounter (Signed)
Attempted to contact pt to schedule COVID testing.

## 2018-10-17 ENCOUNTER — Other Ambulatory Visit: Payer: Medicare Other

## 2018-10-17 DIAGNOSIS — R6889 Other general symptoms and signs: Secondary | ICD-10-CM | POA: Diagnosis not present

## 2018-10-17 DIAGNOSIS — R509 Fever, unspecified: Secondary | ICD-10-CM

## 2018-10-17 DIAGNOSIS — Z20822 Contact with and (suspected) exposure to covid-19: Secondary | ICD-10-CM

## 2018-10-18 ENCOUNTER — Other Ambulatory Visit: Payer: Self-pay

## 2018-10-18 ENCOUNTER — Encounter: Payer: Self-pay | Admitting: Family Medicine

## 2018-10-18 ENCOUNTER — Ambulatory Visit (INDEPENDENT_AMBULATORY_CARE_PROVIDER_SITE_OTHER): Payer: Medicare Other | Admitting: Family Medicine

## 2018-10-18 DIAGNOSIS — R509 Fever, unspecified: Secondary | ICD-10-CM

## 2018-10-18 DIAGNOSIS — Z20818 Contact with and (suspected) exposure to other bacterial communicable diseases: Secondary | ICD-10-CM

## 2018-10-18 MED ORDER — SULFAMETHOXAZOLE-TRIMETHOPRIM 800-160 MG PO TABS: 1.0000 | ORAL_TABLET | Freq: Two times a day (BID) | ORAL | 0 refills | Status: DC

## 2018-10-18 NOTE — Progress Notes (Signed)
Subjective:    Patient ID: Annette Barker, female    DOB: [birth year], 73 y.o.   MRN: [id number]  HPI Patient is being seen today as a telephone visit.  She consents to be seen by telephone.  I am currently in my office.  She is currently at her home on quarantine.  Phone call began at 1015.  Phone call concluded at 1038.  The patient called me over Memorial Day weekend having developed a low-grade fever to 101.  She also had body aches there were vague.  She also had a mild headache as well as a nonproductive cough.  Given her immunocompromise status and potential exposure, we recommended COVID testing.  The patient was tested at an urgent care at [facility] and found to be negative on May 26.  This was the nasal swab.  She was also tested by [facility] yesterday on the 27th although those test are still pending.  Patient states that for the last 4 days she has had a low-grade fever of 100.4-100.7.  The fever quickly resolved with Tylenol.  She also has some mild myalgias in her left shoulder and in her lower back but she equates this to simply lying in bed.  She states that she has a mild nonproductive cough however she thinks this is due to allergies because she has had this cough for more than 5 months.  She also has a dull sinus headache behind her right eye and in her right temple.  She denies any pain in her teeth but she does report rhinorrhea and postnasal drip.  Also over the last 3 days she has developed to erythematous swollen bumps on the posterior aspect of her left shoulder with pus.  Her husband recently died from a MRSA infection and so she is concerned that that could be MRSA Past Medical History:  Diagnosis Date  . Anxiety   . Arthritis    in her hands   . Elevated cholesterol   . Hair loss   . Hemorrhoids   . Hypothyroidism    on synthroid  . IBS (irritable bowel syndrome)   . Osteoporosis 03/2017   T score -3.2  . Thyroid disease    hypothyroid   Past Surgical History:   Procedure Laterality Date  . LAPAROSCOPIC SALPINGO OOPHERECTOMY Bilateral 10/29/2014   Procedure: LAPAROSCOPIC SALPINGO OOPHORECTOMY;  Surgeon: [name], MD;  Location: [facility];  Service: Gynecology;  Laterality: Bilateral;  1:00pm OR time requested  1 1/2 hours OR time requested.  Marland Kitchen SHOULDER SURGERY    . TUBAL LIGATION     Current Outpatient Medications on File Prior to Visit  Medication Sig Dispense Refill  . alendronate (FOSAMAX) 70 MG tablet TAKE 1 TABLET BY MOUTH EVERY 7 DAYS WITH A FULL GLASS OF WATER ON AN EMPTY STOMACH 4 tablet 12  . diazepam (VALIUM) 5 MG tablet Take 1 tablet (5 mg total) by mouth at bedtime. 30 tablet 2  . dicyclomine (BENTYL) 20 MG tablet Take 1 tablet (20 mg total) by mouth every 6 (six) hours. 30 tablet 0  . Estradiol (ESTROGEL) 0.75 MG/1.25 GM (0.06%) topical gel Place 1.25 g onto the skin daily. 50 g 6  . levocetirizine (XYZAL) 5 MG tablet Take 1 tablet (5 mg total) by mouth every evening. 30 tablet 5  . levothyroxine (SYNTHROID, LEVOTHROID) 75 MCG tablet TAKE 1 TABLET(75 MCG) BY MOUTH DAILY BEFORE BREAKFAST 90 tablet 2  . medroxyPROGESTERone (PROVERA) 2.5 MG tablet Take 1  tablet (2.5 mg total) by mouth daily. 90 tablet 3  . mometasone (ELOCON) 0.1 % ointment Apply topically daily. 45 g 0  . predniSONE (DELTASONE) 1 MG tablet 2 mg.      No current facility-administered medications on file prior to visit.    Allergies  Allergen Reactions  . Hydromet [Hydrocodone-Homatropine] Itching and Swelling  . Statins Other (See Comments)    Body cramps all over body   Social History   Socioeconomic History  . Marital status: Married    Spouse name: Not on file  . Number of children: Not on file  . Years of education: Not on file  . Highest education level: Not on file  Occupational History  . Not on file  Social Needs  . Financial resource strain: Not on file  . Food insecurity:    Worry: Not on file    Inability: Not on file  . Transportation  needs:    Medical: Not on file    Non-medical: Not on file  Tobacco Use  . Smoking status: Former Smoker    Types: Cigarettes    Last attempt to quit: 05/27/1991    Years since quitting: 27.4  . Smokeless tobacco: Never Used  Substance and Sexual Activity  . Alcohol use: Yes    Alcohol/week: 0.0 standard drinks    Comment: rare  . Drug use: No  . Sexual activity: Yes    Birth control/protection: Post-menopausal    Comment: 1st intercourse 73 yo--Fewer than 5 partners  Lifestyle  . Physical activity:    Days per week: Not on file    Minutes per session: Not on file  . Stress: Not on file  Relationships  . Social connections:    Talks on phone: Not on file    Gets together: Not on file    Attends religious service: Not on file    Active member of club or organization: Not on file    Attends meetings of clubs or organizations: Not on file    Relationship status: Not on file  . Intimate partner violence:    Fear of current or ex partner: Not on file    Emotionally abused: Not on file    Physically abused: Not on file    Forced sexual activity: Not on file  Other Topics Concern  . Not on file  Social History Narrative  . Not on file      Review of Systems  All other systems reviewed and are negative.      Objective:   Physical Exam   No physical exam was performed today as the patient was seen as a telephone visit    Assessment & Plan:  Fever and chills  Exposure to MRSA  Hard to determine the cause of her fever.  Given her recent exposure to MRSA, I am concerned about abscess versus cellulitis causing the pus bumps on the posterior aspect of her left shoulder.  Therefore I will treat the patient with Bactrim double strength tablets 1 p.o. twice daily for 7 days.  However I am not certain this could potentially be shingles.  The patient denies any neuropathic pain at the present time in that area so I will treat the patient as though it is a staph infection.  This  would also cover possible urinary tract infections however she states that she had a urinalysis at the urgent care that was negative except for trace amounts of blood.  Her COVID test at [facility]  was negative.  She should have another 1 pending today or tomorrow and if this is negative I feel that we have completely ruled out COVID-19.  I doubt pneumonia given the lack of cough.  There is the possibility of a sinus infection.  Bactrim should give relatively good coverage for a sinus infection as well.  The other possibility would be just simply another virus that needs to run its course however I would treat the pus bumps on the back of her shoulder with an antibiotic given her immunocompromise status and the fever

## 2018-10-19 ENCOUNTER — Telehealth: Payer: Self-pay | Admitting: Family Medicine

## 2018-10-19 LAB — NOVEL CORONAVIRUS, NAA: SARS-CoV-2, NAA: NOT DETECTED

## 2018-10-19 NOTE — Telephone Encounter (Signed)
Results of covid-19, which noted not detected, given to patient with verbal understanding.

## 2018-10-22 ENCOUNTER — Encounter: Payer: Self-pay | Admitting: Family Medicine

## 2018-10-28 ENCOUNTER — Encounter: Payer: Self-pay | Admitting: Gynecology

## 2018-10-28 ENCOUNTER — Encounter: Payer: Self-pay | Admitting: Family Medicine

## 2018-10-29 ENCOUNTER — Other Ambulatory Visit: Payer: Self-pay

## 2018-10-29 ENCOUNTER — Telehealth: Payer: Self-pay | Admitting: Podiatry

## 2018-10-29 NOTE — Telephone Encounter (Signed)
Pt would like another foot brace if possible. The last one she had is all worn out.

## 2018-10-30 ENCOUNTER — Other Ambulatory Visit: Payer: Self-pay

## 2018-10-30 ENCOUNTER — Encounter: Payer: Self-pay | Admitting: Family Medicine

## 2018-10-30 ENCOUNTER — Ambulatory Visit (INDEPENDENT_AMBULATORY_CARE_PROVIDER_SITE_OTHER): Payer: Medicare Other | Admitting: Family Medicine

## 2018-10-30 VITALS — BP 100/60 | HR 68 | Temp 98.7°F | Resp 14 | Ht 64.0 in | Wt 135.0 lb

## 2018-10-30 DIAGNOSIS — R509 Fever, unspecified: Secondary | ICD-10-CM | POA: Diagnosis not present

## 2018-10-30 LAB — URINALYSIS, ROUTINE W REFLEX MICROSCOPIC
Bacteria, UA: NONE SEEN /HPF
Bilirubin Urine: NEGATIVE
Glucose, UA: NEGATIVE
Hyaline Cast: NONE SEEN /LPF
Ketones, ur: NEGATIVE
Nitrite: NEGATIVE
Protein, ur: NEGATIVE
Specific Gravity, Urine: 1.015 (ref 1.001–1.03)
pH: 6 (ref 5.0–8.0)

## 2018-10-30 LAB — MICROSCOPIC MESSAGE

## 2018-10-30 NOTE — Progress Notes (Signed)
Subjective:    Patient ID: Annette Barker, female    DOB: 02-Jun-1945, 73 y.o.   MRN: [id number]  HPI Patient is being seen today for fever of unknown origin.  Approximately 3 weeks ago, the patient developed a fever to 101 on Saturday.  She also had a dull constant headache behind her eyes.  The patient kept a low-grade fever of 100-1 01 for 1 week.  She also continued to have a dull headache that she described as a sinus headache.  Given the current situation she was valuated for coronavirus on 2 separate occasions by 2 different labs.  Both COVID-19 test returned negative.  During that time, the headache has gradually improved.  However it has persisted.  All last week or fever completely subsided.  Please see her previous office visit.  I gave the patient an antibiotic for possible sinus infection versus folliculitis.  The patient never took the antibiotic.  She called yesterday stating that the fever had returned to 101.  Therefore I asked the patient to come in today for an evaluation.  She denies any symptoms aside from a postnasal drip nonproductive dry itchy cough that occurs occasionally.  She also reports some mild shortness of breath with activity this weekend when she was trying [place] hike in the mountains.  She also continues to have a dull retro-orbital headache although much better than it was a week ago.  She denies any hemoptysis.  She denies any pleurisy.  She denies any sore throat.  She denies any myalgias.  She denies any rashes.  She denies any abdominal pain.  She denies any nausea vomiting or diarrhea.  She did have a tick bite approximately 2 weeks prior to the fever beginning.  She denied any rash associated with the tick bite however it was embedded.  She denies any dysuria or vaginal discharge.  She denies any weight loss.  Of note past medical history is significant for polymyalgia rheumatica currently on prednisone.  However she denies any symptoms of temporal arteritis.  She also  denies any shoulder pain or hip pain consistent with PMR exacerbation Past Medical History:  Diagnosis Date   Anxiety    Arthritis    in her hands    Elevated cholesterol    Hair loss    Hemorrhoids    Hypothyroidism    on synthroid   IBS (irritable bowel syndrome)    Osteoporosis 03/2017   T score -3.2   Thyroid disease    hypothyroid   Past Surgical History:  Procedure Laterality Date   LAPAROSCOPIC SALPINGO OOPHERECTOMY Bilateral 10/29/2014   Procedure: LAPAROSCOPIC SALPINGO OOPHORECTOMY;  Surgeon: [name], MD;  Location: [facility];  Service: Gynecology;  Laterality: Bilateral;  1:00pm OR time requested  1 1/2 hours OR time requested.   SHOULDER SURGERY     TUBAL LIGATION     Current Outpatient Medications on File Prior to Visit  Medication Sig Dispense Refill   alendronate (FOSAMAX) 70 MG tablet TAKE 1 TABLET BY MOUTH EVERY 7 DAYS WITH A FULL GLASS OF WATER ON AN EMPTY STOMACH 4 tablet 12   diazepam (VALIUM) 5 MG tablet Take 1 tablet (5 mg total) by mouth at bedtime. 30 tablet 2   dicyclomine (BENTYL) 20 MG tablet Take 1 tablet (20 mg total) by mouth every 6 (six) hours. 30 tablet 0   Estradiol (ESTROGEL) 0.75 MG/1.25 GM (0.06%) topical gel Place 1.25 g onto the skin daily. 50 g 6  levocetirizine (XYZAL) 5 MG tablet Take 1 tablet (5 mg total) by mouth every evening. 30 tablet 5   levothyroxine (SYNTHROID, LEVOTHROID) 75 MCG tablet TAKE 1 TABLET(75 MCG) BY MOUTH DAILY BEFORE BREAKFAST 90 tablet 2   medroxyPROGESTERone (PROVERA) 2.5 MG tablet Take 1 tablet (2.5 mg total) by mouth daily. 90 tablet 3   mometasone (ELOCON) 0.1 % ointment Apply topically daily. 45 g 0   predniSONE (DELTASONE) 1 MG tablet 2 mg.      No current facility-administered medications on file prior to visit.    Allergies  Allergen Reactions   Hydromet [Hydrocodone-Homatropine] Itching and Swelling   Statins Other (See Comments)    Body cramps all over body   Social  History   Socioeconomic History   Marital status: Married    Spouse name: Not on file   Number of children: Not on file   Years of education: Not on file   Highest education level: Not on file  Occupational History   Not on file  Social Needs   Financial resource strain: Not on file   Food insecurity:    Worry: Not on file    Inability: Not on file   Transportation needs:    Medical: Not on file    Non-medical: Not on file  Tobacco Use   Smoking status: Former Smoker    Types: Cigarettes    Last attempt to quit: 05/27/1991    Years since quitting: 27.4   Smokeless tobacco: Never Used  Substance and Sexual Activity   Alcohol use: Yes    Alcohol/week: 0.0 standard drinks    Comment: rare   Drug use: No   Sexual activity: Yes    Birth control/protection: Post-menopausal    Comment: 1st intercourse 73 yo--Fewer than 5 partners  Lifestyle   Physical activity:    Days per week: Not on file    Minutes per session: Not on file   Stress: Not on file  Relationships   Social connections:    Talks on phone: Not on file    Gets together: Not on file    Attends religious service: Not on file    Active member of club or organization: Not on file    Attends meetings of clubs or organizations: Not on file    Relationship status: Not on file   Intimate partner violence:    Fear of current or ex partner: Not on file    Emotionally abused: Not on file    Physically abused: Not on file    Forced sexual activity: Not on file  Other Topics Concern   Not on file  Social History Narrative   Not on file      Review of Systems  All other systems reviewed and are negative.      Objective:   Physical Exam Vitals signs reviewed.  Constitutional:      General: She is not in acute distress.    Appearance: Normal appearance. She is not ill-appearing, toxic-appearing or diaphoretic.  HENT:     Head: Normocephalic and atraumatic.     Right Ear: Tympanic membrane  and ear canal normal. There is no impacted cerumen.     Left Ear: Tympanic membrane and ear canal normal. There is no impacted cerumen.     Nose: Nose normal. No congestion or rhinorrhea.     Mouth/Throat:     Mouth: Mucous membranes are moist.     Pharynx: No oropharyngeal exudate or posterior oropharyngeal erythema.  Eyes:  General: No scleral icterus.       Right eye: No discharge.        Left eye: No discharge.     Extraocular Movements: Extraocular movements intact.     Conjunctiva/sclera: Conjunctivae normal.     Pupils: Pupils are equal, round, and reactive to light.  Neck:     Musculoskeletal: Neck supple. No neck rigidity.  Cardiovascular:     Rate and Rhythm: Normal rate and regular rhythm.     Pulses: Normal pulses.     Heart sounds: Normal heart sounds. No murmur. No friction rub. No gallop.   Pulmonary:     Effort: Pulmonary effort is normal. No respiratory distress.     Breath sounds: Normal breath sounds. No stridor. No wheezing, rhonchi or rales.  Chest:     Chest wall: No tenderness.  Abdominal:     General: Bowel sounds are normal.     Palpations: Abdomen is soft.     Tenderness: There is no abdominal tenderness. There is no right CVA tenderness, left CVA tenderness, guarding or rebound.  Musculoskeletal:     Right lower leg: No edema.     Left lower leg: No edema.  Lymphadenopathy:     Cervical: No cervical adenopathy.  Skin:    Coloration: Skin is not jaundiced.     Findings: No erythema or rash.  Neurological:     General: No focal deficit present.     Mental Status: She is alert and oriented to person, place, and time. Mental status is at baseline.     Motor: No weakness.     Coordination: Coordination normal.           Assessment & Plan:  Fever, unspecified fever cause - Plan: Urinalysis, Routine w reflex microscopic, CBC with Differential/Platelet, COMPLETE METABOLIC PANEL WITH GFR, Urine Culture, Sedimentation rate, Culture, blood (single)  w Reflex to ID Panel, DG Chest 2 View, B. burgdorfi antibodies by WB, [place] spotted fvr abs pnl(IgG+IgM)  Fever and chills  Patient has fever of unknown origin.  Differential diagnosis includes a sinus infection given her headache, and exacerbation of her PMR, tickborne illness given the recent relationship to a tick bite, some type of viral syndrome, autoimmune diseases, malignancy, urinary tract infection, bacteremia.  Begin the work-up by obtaining a urinalysis, CBC, CMP, urine culture, sedimentation rate, Lyme titers, RMSF titers, and a chest x-ray.  If chest x-ray shows pneumonia obviously we will treat that.  If CBC is normal and sedimentation rate is extremely high, consider increasing prednisone for PMR.  Treat any abnormality seen on urinalysis or urine culture if all lab work is normal, treat patient empirically for a sinus infection.  Consider starting doxycycline for tickborne illness if no other etiology is determined

## 2018-10-31 ENCOUNTER — Ambulatory Visit
Admission: RE | Admit: 2018-10-31 | Discharge: 2018-10-31 | Disposition: A | Discharge status: Home / Self Care | Payer: Medicare Other | Source: Ambulatory Visit | Attending: Family Medicine | Admitting: Family Medicine

## 2018-10-31 DIAGNOSIS — R509 Fever, unspecified: Secondary | ICD-10-CM

## 2018-10-31 DIAGNOSIS — I517 Cardiomegaly: Secondary | ICD-10-CM | POA: Diagnosis not present

## 2018-10-31 DIAGNOSIS — R918 Other nonspecific abnormal finding of lung field: Secondary | ICD-10-CM | POA: Diagnosis not present

## 2018-10-31 LAB — URINE CULTURE
MICRO NUMBER:: 551385
SPECIMEN QUALITY:: ADEQUATE

## 2018-11-01 ENCOUNTER — Telehealth: Payer: Self-pay | Admitting: *Deleted

## 2018-11-01 NOTE — Telephone Encounter (Signed)
Returned patient's call at [phone] (Cell #) regarding patient wanting to get another brace because her other one is worn out.  I left a message telling the patient to come to our office and ask for me, Juliann Pulse. I will get her a brace when she comes to the office. There will be a charge for the brace though since she wants a new one.  I informed patient that I would be here tomorrow, Friday, November 02, 2018 from 7:30 am to 2:00 pm.  Waiting for a response.

## 2018-11-02 ENCOUNTER — Other Ambulatory Visit: Payer: Self-pay | Admitting: Family Medicine

## 2018-11-02 MED ORDER — DOXYCYCLINE HYCLATE 100 MG PO TABS: 100.0000 mg | ORAL_TABLET | Freq: Two times a day (BID) | ORAL | 0 refills | Status: DC

## 2018-11-05 ENCOUNTER — Encounter: Payer: Self-pay | Admitting: Family Medicine

## 2018-11-07 LAB — COMPLETE METABOLIC PANEL WITH GFR
AG Ratio: 1.3 (calc) (ref 1.0–2.5)
ALT: 32 U/L — ABNORMAL HIGH (ref 6–29)
AST: 24 U/L (ref 10–35)
Albumin: 4 g/dL (ref 3.6–5.1)
Alkaline phosphatase (APISO): 77 U/L (ref 37–153)
BUN: 16 mg/dL (ref 7–25)
CO2: 24 mmol/L (ref 20–32)
Calcium: 8.5 mg/dL — ABNORMAL LOW (ref 8.6–10.4)
Chloride: 104 mmol/L (ref 98–110)
Creat: 0.81 mg/dL (ref 0.60–0.93)
GFR, Est African American: 85 mL/min/{1.73_m2} (ref >=60)
GFR, Est Non African American: 74 mL/min/{1.73_m2} (ref >=60)
Globulin: 3 g/dL (calc) (ref 1.9–3.7)
Glucose, Bld: 88 mg/dL (ref 65–99)
Potassium: 4 mmol/L (ref 3.5–5.3)
Sodium: 136 mmol/L (ref 135–146)
Total Bilirubin: 0.2 mg/dL (ref 0.2–1.2)
Total Protein: 7 g/dL (ref 6.1–8.1)

## 2018-11-07 LAB — CBC WITH DIFFERENTIAL/PLATELET
Absolute Monocytes: 624 cells/uL (ref 200–950)
Basophils Absolute: 52 cells/uL (ref 0–200)
Basophils Relative: 1.2 %
Eosinophils Absolute: 39 cells/uL (ref 15–500)
Eosinophils Relative: 0.9 %
HCT: 33.7 % — ABNORMAL LOW (ref 35.0–45.0)
Hemoglobin: 11.2 g/dL — ABNORMAL LOW (ref 11.7–15.5)
Lymphs Abs: 1862 cells/uL (ref 850–3900)
MCH: 32.3 pg (ref 27.0–33.0)
MCHC: 33.2 g/dL (ref 32.0–36.0)
MCV: 97.1 fL (ref 80.0–100.0)
MPV: 9.5 fL (ref 7.5–12.5)
Monocytes Relative: 14.5 %
Neutro Abs: 1724 cells/uL (ref 1500–7800)
Neutrophils Relative %: 40.1 %
Platelets: 338 10*3/uL (ref 140–400)
RBC: 3.47 10*6/uL — ABNORMAL LOW (ref 3.80–5.10)
RDW: 14 % (ref 11.0–15.0)
Total Lymphocyte: 43.3 %
WBC: 4.3 10*3/uL (ref 3.8–10.8)

## 2018-11-07 LAB — B. BURGDORFI ANTIBODIES BY WB
B burgdorferi IgG Abs (IB): NEGATIVE
B burgdorferi IgM Abs (IB): POSITIVE — AB
Lyme Disease 18 kD IgG: REACTIVE — AB
Lyme Disease 23 kD IgG: REACTIVE — AB
Lyme Disease 23 kD IgM: NONREACTIVE
Lyme Disease 28 kD IgG: NONREACTIVE
Lyme Disease 30 kD IgG: NONREACTIVE
Lyme Disease 39 kD IgG: NONREACTIVE
Lyme Disease 39 kD IgM: REACTIVE — AB
Lyme Disease 41 kD IgG: NONREACTIVE
Lyme Disease 41 kD IgM: REACTIVE — AB
Lyme Disease 45 kD IgG: NONREACTIVE
Lyme Disease 58 kD IgG: NONREACTIVE
Lyme Disease 66 kD IgG: NONREACTIVE
Lyme Disease 93 kD IgG: REACTIVE — AB

## 2018-11-07 LAB — CULTURE BLOOD MANUAL
Micro Number: 555594
Result: NO GROWTH
Specimen Quality: ADEQUATE

## 2018-11-07 LAB — ROCKY MTN SPOTTED FVR ABS PNL(IGG+IGM)
RMSF IgG: NOT DETECTED
RMSF IgM: NOT DETECTED

## 2018-11-07 LAB — SEDIMENTATION RATE: Sed Rate: 22 mm/h (ref 0–30)

## 2018-11-09 ENCOUNTER — Encounter: Payer: Self-pay | Admitting: Family Medicine

## 2018-11-09 ENCOUNTER — Other Ambulatory Visit: Payer: Self-pay | Admitting: Family Medicine

## 2018-11-09 MED ORDER — DOXYCYCLINE HYCLATE 100 MG PO TABS: 100.0000 mg | ORAL_TABLET | Freq: Two times a day (BID) | ORAL | 0 refills | Status: DC

## 2018-11-15 ENCOUNTER — Other Ambulatory Visit: Payer: Self-pay | Admitting: Family Medicine

## 2018-12-19 ENCOUNTER — Encounter: Payer: Self-pay | Admitting: Family Medicine

## 2018-12-24 ENCOUNTER — Other Ambulatory Visit: Payer: Self-pay

## 2018-12-29 ENCOUNTER — Other Ambulatory Visit: Payer: Self-pay | Admitting: Family Medicine

## 2018-12-31 NOTE — Telephone Encounter (Signed)
Requesting refill    Valium   LOV: 10/30/18  LRF:  07/03/18

## 2019-01-14 ENCOUNTER — Other Ambulatory Visit: Payer: Self-pay

## 2019-01-15 ENCOUNTER — Encounter: Payer: Self-pay | Admitting: Family Medicine

## 2019-01-15 ENCOUNTER — Ambulatory Visit (INDEPENDENT_AMBULATORY_CARE_PROVIDER_SITE_OTHER): Payer: Medicare Other | Admitting: Family Medicine

## 2019-01-15 VITALS — BP 112/60 | HR 56 | Temp 98.0°F | Resp 14 | Ht 64.0 in | Wt 133.0 lb

## 2019-01-15 DIAGNOSIS — Z13220 Encounter for screening for lipoid disorders: Secondary | ICD-10-CM | POA: Diagnosis not present

## 2019-01-15 DIAGNOSIS — Z0000 Encounter for general adult medical examination without abnormal findings: Secondary | ICD-10-CM

## 2019-01-15 DIAGNOSIS — E785 Hyperlipidemia, unspecified: Secondary | ICD-10-CM | POA: Diagnosis not present

## 2019-01-15 DIAGNOSIS — Z23 Encounter for immunization: Secondary | ICD-10-CM

## 2019-01-15 DIAGNOSIS — Z0001 Encounter for general adult medical examination with abnormal findings: Secondary | ICD-10-CM | POA: Diagnosis not present

## 2019-01-15 DIAGNOSIS — M353 Polymyalgia rheumatica: Secondary | ICD-10-CM

## 2019-01-15 DIAGNOSIS — R5382 Chronic fatigue, unspecified: Secondary | ICD-10-CM

## 2019-01-15 NOTE — Progress Notes (Signed)
Subjective:    Patient ID: Annette Barker, female    DOB: 09/12/1945, 73 y.o.   MRN: [id number]  HPI Patient is a very pleasant 73 year old Caucasian female here today for complete physical exam.  She is due for Prevnar 13.  She is due for her seasonal flu shot.  Otherwise her immunizations are up-to-date.  I did mention the shingles vaccine but I would recommend deferring that at the present time until after the coronavirus pandemic.  Her colonoscopy was performed in 2017.  She is due for that again in 2022.  She is due for her Pap smear but she gets this at her gynecologist.  Her mammogram was performed in February and was normal.  Her last bone density test was in November 2018.  That is due this November but she also gets this at her gynecologist.  She does have a history of osteoporosis for which she is currently taking Fosamax, calcium, and vitamin D.  Patient has fallen 3 times in the last year however she admits this was being in too much of a hurry.  She denies any issues with her balance.  She denies any serious injuries.  She is very physically active and walks her dog 3 times a day.  She denies any concerning memory loss.  She denies any difficulty performing her ADLs.  After the death of her husband, the patient is lonely but she actually denies depression.  Overall she seems to be doing very well. Past Medical History:  Diagnosis Date  . Anxiety   . Arthritis    in her hands   . Elevated cholesterol   . Hair loss   . Hemorrhoids   . Hypothyroidism    on synthroid  . IBS (irritable bowel syndrome)   . Osteoporosis 03/2017   T score -3.2  . Thyroid disease    hypothyroid   Past Surgical History:  Procedure Laterality Date  . LAPAROSCOPIC SALPINGO OOPHERECTOMY Bilateral 10/29/2014   Procedure: LAPAROSCOPIC SALPINGO OOPHORECTOMY;  Surgeon: [name], MD;  Location: [facility];  Service: Gynecology;  Laterality: Bilateral;  1:00pm OR time requested  1 1/2 hours OR time  requested.  Marland Kitchen SHOULDER SURGERY    . TUBAL LIGATION     Current Outpatient Medications on File Prior to Visit  Medication Sig Dispense Refill  . alendronate (FOSAMAX) 70 MG tablet TAKE 1 TABLET BY MOUTH EVERY 7 DAYS WITH A FULL GLASS OF WATER ON AN EMPTY STOMACH 4 tablet 12  . diazepam (VALIUM) 5 MG tablet TAKE 1 TABLET BY MOUTH EVERYDAY AT BEDTIME 30 tablet 2  . diphenhydrAMINE (BENADRYL) 25 mg capsule Take 25 mg by mouth every 6 (six) hours as needed.    . Estradiol (ESTROGEL) 0.75 MG/1.25 GM (0.06%) topical gel Place 1.25 g onto the skin daily. 50 g 6  . levothyroxine (SYNTHROID, LEVOTHROID) 75 MCG tablet TAKE 1 TABLET(75 MCG) BY MOUTH DAILY BEFORE BREAKFAST 90 tablet 2  . medroxyPROGESTERone (PROVERA) 2.5 MG tablet Take 1 tablet (2.5 mg total) by mouth daily. 90 tablet 3  . predniSONE (DELTASONE) 1 MG tablet 1.5 mg.      No current facility-administered medications on file prior to visit.    Allergies  Allergen Reactions  . Hydromet [Hydrocodone-Homatropine] Itching and Swelling  . Statins Other (See Comments)    Body cramps all over body   Social History   Socioeconomic History  . Marital status: Married    Spouse name: Not on file  . Number  of children: Not on file  . Years of education: Not on file  . Highest education level: Not on file  Occupational History  . Not on file  Social Needs  . Financial resource strain: Not on file  . Food insecurity    Worry: Not on file    Inability: Not on file  . Transportation needs    Medical: Not on file    Non-medical: Not on file  Tobacco Use  . Smoking status: Former Smoker    Types: Cigarettes    Quit date: 05/27/1991    Years since quitting: 27.6  . Smokeless tobacco: Never Used  Substance and Sexual Activity  . Alcohol use: Yes    Alcohol/week: 0.0 standard drinks    Comment: rare  . Drug use: No  . Sexual activity: Yes    Birth control/protection: Post-menopausal    Comment: 1st intercourse 73 yo--Fewer than 5  partners  Lifestyle  . Physical activity    Days per week: Not on file    Minutes per session: Not on file  . Stress: Not on file  Relationships  . Social Herbalist on phone: Not on file    Gets together: Not on file    Attends religious service: Not on file    Active member of club or organization: Not on file    Attends meetings of clubs or organizations: Not on file    Relationship status: Not on file  . Intimate partner violence    Fear of current or ex partner: Not on file    Emotionally abused: Not on file    Physically abused: Not on file    Forced sexual activity: Not on file  Other Topics Concern  . Not on file  Social History Narrative  . Not on file   Family History  Problem Relation Age of Onset  . Alzheimer's disease Mother   . Arthritis Mother   . Osteoporosis Father   . Cancer Father        leukemia  . Arthritis Father   . Hyperlipidemia Father   . Cancer Sister        PERITONEAL CANCER  . Arthritis Sister   . Heart disease Sister   . Arthritis Sister   . Alcohol abuse Sister   . Colitis Son   . Anorexia nervosa Grandchild       Review of Systems  All other systems reviewed and are negative.      Objective:   Physical Exam Vitals signs reviewed.  Constitutional:      General: She is not in acute distress.    Appearance: Normal appearance. She is normal weight. She is not ill-appearing, toxic-appearing or diaphoretic.  HENT:     Head: Normocephalic and atraumatic.     Right Ear: Tympanic membrane, ear canal and external ear normal. There is no impacted cerumen.     Left Ear: Tympanic membrane, ear canal and external ear normal. There is no impacted cerumen.     Nose: Nose normal. No congestion or rhinorrhea.     Mouth/Throat:     Mouth: Mucous membranes are moist.     Pharynx: No oropharyngeal exudate or posterior oropharyngeal erythema.  Eyes:     General: No scleral icterus.       Right eye: No discharge.        Left eye:  No discharge.     Extraocular Movements: Extraocular movements intact.     Conjunctiva/sclera: Conjunctivae normal.  Pupils: Pupils are equal, round, and reactive to light.  Neck:     Musculoskeletal: Normal range of motion and neck supple. No neck rigidity or muscular tenderness.     Vascular: No carotid bruit.  Cardiovascular:     Rate and Rhythm: Normal rate and regular rhythm.     Pulses: Normal pulses.     Heart sounds: Normal heart sounds. No murmur. No friction rub. No gallop.   Pulmonary:     Effort: Pulmonary effort is normal. No respiratory distress.     Breath sounds: Normal breath sounds. No stridor. No wheezing, rhonchi or rales.  Chest:     Chest wall: No tenderness.  Abdominal:     General: Abdomen is flat. Bowel sounds are normal. There is no distension.     Tenderness: There is no abdominal tenderness. There is no right CVA tenderness, left CVA tenderness, guarding or rebound.     Hernia: No hernia is present.  Musculoskeletal:        General: No tenderness, deformity or signs of injury.     Right lower leg: No edema.     Left lower leg: No edema.  Lymphadenopathy:     Cervical: No cervical adenopathy.  Skin:    General: Skin is warm.     Coloration: Skin is not jaundiced or pale.     Findings: No bruising, erythema, lesion or rash.  Neurological:     General: No focal deficit present.     Mental Status: She is alert and oriented to person, place, and time. Mental status is at baseline.     Cranial Nerves: No cranial nerve deficit.     Sensory: No sensory deficit.     Motor: No weakness.     Coordination: Coordination normal.     Gait: Gait normal.     Deep Tendon Reflexes: Reflexes normal.  Psychiatric:        Mood and Affect: Mood normal.        Behavior: Behavior normal.        Thought Content: Thought content normal.        Judgment: Judgment normal.           Assessment & Plan:  The primary encounter diagnosis was Screening cholesterol  level. Diagnoses of Chronic fatigue, PMR (polymyalgia rheumatica) ([place]), and General medical exam were also pertinent to this visit. Patient's physical exam today is completely normal.  Her blood pressure is well controlled.  Patient received Prevnar 13.  She received her flu shot.  I recommended the shingles vaccine at the end of the COVID-19 pandemic.  She denies issues with depression or memory loss or ADLs.  She denies any balance issues.  I will check a CBC, fasting lipid panel, and TSH.  If lab work is normal, no further labs are necessary.  Colonoscopy is up-to-date.  Pap smear is not required due to age.  Mammogram was performed in February and is up-to-date.  Regular anticipatory guidance is provided.

## 2019-01-15 NOTE — Addendum Note (Signed)
Addended by: Shary Decamp B on: 01/15/2019 10:46 AM   Modules accepted: Orders

## 2019-01-16 LAB — CBC WITH DIFFERENTIAL/PLATELET
Absolute Monocytes: 504 cells/uL (ref 200–950)
Basophils Absolute: 60 cells/uL (ref 0–200)
Basophils Relative: 1 %
Eosinophils Absolute: 204 cells/uL (ref 15–500)
Eosinophils Relative: 3.4 %
HCT: 37 % (ref 35.0–45.0)
Hemoglobin: 11.9 g/dL (ref 11.7–15.5)
Lymphs Abs: 2370 cells/uL (ref 850–3900)
MCH: 31.6 pg (ref 27.0–33.0)
MCHC: 32.2 g/dL (ref 32.0–36.0)
MCV: 98.4 fL (ref 80.0–100.0)
MPV: 9.8 fL (ref 7.5–12.5)
Monocytes Relative: 8.4 %
Neutro Abs: 2862 cells/uL (ref 1500–7800)
Neutrophils Relative %: 47.7 %
Platelets: 282 10*3/uL (ref 140–400)
RBC: 3.76 10*6/uL — ABNORMAL LOW (ref 3.80–5.10)
RDW: 13.3 % (ref 11.0–15.0)
Total Lymphocyte: 39.5 %
WBC: 6 10*3/uL (ref 3.8–10.8)

## 2019-01-16 LAB — LIPID PANEL
Cholesterol: 262 mg/dL — ABNORMAL HIGH (ref <200)
HDL: 55 mg/dL (ref >=50)
LDL Cholesterol (Calc): 189 mg/dL (calc) — ABNORMAL HIGH
Non-HDL Cholesterol (Calc): 207 mg/dL (calc) — ABNORMAL HIGH (ref <130)
Total CHOL/HDL Ratio: 4.8 (calc) (ref <5.0)
Triglycerides: 79 mg/dL (ref <150)

## 2019-01-16 LAB — TSH: TSH: 2.28 mIU/L (ref 0.40–4.50)

## 2019-01-17 ENCOUNTER — Encounter: Payer: Self-pay | Admitting: Family Medicine

## 2019-01-17 DIAGNOSIS — E785 Hyperlipidemia, unspecified: Secondary | ICD-10-CM | POA: Insufficient documentation

## 2019-01-21 MED ORDER — ROSUVASTATIN CALCIUM 10 MG PO TABS: 10.0000 mg | ORAL_TABLET | Freq: Every day | ORAL | 3 refills | Status: DC

## 2019-01-23 ENCOUNTER — Encounter: Payer: Self-pay | Admitting: Family Medicine

## 2019-01-30 DIAGNOSIS — H353131 Nonexudative age-related macular degeneration, bilateral, early dry stage: Secondary | ICD-10-CM | POA: Diagnosis not present

## 2019-01-30 DIAGNOSIS — H2513 Age-related nuclear cataract, bilateral: Secondary | ICD-10-CM | POA: Diagnosis not present

## 2019-01-30 DIAGNOSIS — D3132 Benign neoplasm of left choroid: Secondary | ICD-10-CM | POA: Diagnosis not present

## 2019-01-30 DIAGNOSIS — H43813 Vitreous degeneration, bilateral: Secondary | ICD-10-CM | POA: Diagnosis not present

## 2019-02-05 ENCOUNTER — Encounter: Payer: Self-pay | Admitting: Family Medicine

## 2019-02-15 ENCOUNTER — Encounter: Payer: Self-pay | Admitting: Family Medicine

## 2019-02-17 ENCOUNTER — Encounter: Payer: Self-pay | Admitting: Family Medicine

## 2019-02-27 ENCOUNTER — Encounter: Payer: Self-pay | Admitting: Gynecology

## 2019-02-28 ENCOUNTER — Encounter: Payer: Self-pay | Admitting: Family Medicine

## 2019-03-14 ENCOUNTER — Encounter: Payer: Self-pay | Admitting: Family Medicine

## 2019-03-14 ENCOUNTER — Encounter: Payer: Self-pay | Admitting: Gynecology

## 2019-03-27 DIAGNOSIS — M2550 Pain in unspecified joint: Secondary | ICD-10-CM | POA: Diagnosis not present

## 2019-03-27 DIAGNOSIS — Z7952 Long term (current) use of systemic steroids: Secondary | ICD-10-CM | POA: Diagnosis not present

## 2019-03-27 DIAGNOSIS — M810 Age-related osteoporosis without current pathological fracture: Secondary | ICD-10-CM | POA: Diagnosis not present

## 2019-03-27 DIAGNOSIS — M353 Polymyalgia rheumatica: Secondary | ICD-10-CM | POA: Diagnosis not present

## 2019-04-17 ENCOUNTER — Other Ambulatory Visit: Payer: Self-pay

## 2019-04-17 DIAGNOSIS — Z20828 Contact with and (suspected) exposure to other viral communicable diseases: Secondary | ICD-10-CM | POA: Diagnosis not present

## 2019-04-29 ENCOUNTER — Encounter: Payer: Self-pay | Admitting: Family Medicine

## 2019-04-30 ENCOUNTER — Other Ambulatory Visit: Payer: Self-pay | Admitting: Family Medicine

## 2019-04-30 MED ORDER — VIBERZI 100 MG PO TABS: 100.0000 mg | ORAL_TABLET | Freq: Two times a day (BID) | ORAL | 1 refills | Status: DC

## 2019-04-30 MED ORDER — LEVOTHYROXINE SODIUM 75 MCG PO TABS: ORAL_TABLET | ORAL | 0 refills | Status: DC

## 2019-04-30 NOTE — Telephone Encounter (Signed)
Ok to refill 

## 2019-05-02 ENCOUNTER — Encounter: Payer: Self-pay | Admitting: Family Medicine

## 2019-05-29 DIAGNOSIS — L738 Other specified follicular disorders: Secondary | ICD-10-CM | POA: Diagnosis not present

## 2019-05-29 DIAGNOSIS — D2271 Melanocytic nevi of right lower limb, including hip: Secondary | ICD-10-CM | POA: Diagnosis not present

## 2019-05-29 DIAGNOSIS — D1801 Hemangioma of skin and subcutaneous tissue: Secondary | ICD-10-CM | POA: Diagnosis not present

## 2019-05-29 DIAGNOSIS — L821 Other seborrheic keratosis: Secondary | ICD-10-CM | POA: Diagnosis not present

## 2019-06-01 ENCOUNTER — Encounter: Payer: Self-pay | Admitting: Family Medicine

## 2019-06-08 ENCOUNTER — Encounter: Payer: Self-pay | Admitting: Family Medicine

## 2019-06-17 ENCOUNTER — Other Ambulatory Visit: Payer: Self-pay

## 2019-06-17 DIAGNOSIS — M818 Other osteoporosis without current pathological fracture: Secondary | ICD-10-CM

## 2019-06-26 ENCOUNTER — Other Ambulatory Visit: Payer: Self-pay

## 2019-06-27 ENCOUNTER — Ambulatory Visit (INDEPENDENT_AMBULATORY_CARE_PROVIDER_SITE_OTHER): Payer: Medicare Other

## 2019-06-27 DIAGNOSIS — M818 Other osteoporosis without current pathological fracture: Secondary | ICD-10-CM

## 2019-06-27 DIAGNOSIS — M810 Age-related osteoporosis without current pathological fracture: Secondary | ICD-10-CM

## 2019-06-27 DIAGNOSIS — Z780 Asymptomatic menopausal state: Secondary | ICD-10-CM

## 2019-06-28 ENCOUNTER — Other Ambulatory Visit: Payer: Self-pay | Admitting: Obstetrics & Gynecology

## 2019-06-28 DIAGNOSIS — M810 Age-related osteoporosis without current pathological fracture: Secondary | ICD-10-CM

## 2019-06-28 DIAGNOSIS — Z780 Asymptomatic menopausal state: Secondary | ICD-10-CM

## 2019-06-30 ENCOUNTER — Ambulatory Visit: Payer: Medicare Other | Attending: Internal Medicine

## 2019-06-30 DIAGNOSIS — Z23 Encounter for immunization: Secondary | ICD-10-CM | POA: Insufficient documentation

## 2019-06-30 NOTE — Progress Notes (Signed)
   Covid-19 Vaccination Clinic  Name:  Annette Barker    MRN: [id number] DOB: 1946/03/28  06/30/2019  [name] was observed post Covid-19 immunization for 30 minutes based on pre-vaccination screening without incidence. She was provided with Vaccine Information Sheet and instruction to access the V-Safe system.   [name] was instructed to call 911 with any severe reactions post vaccine: Marland Kitchen Difficulty breathing  . Swelling of your face and throat  . A fast heartbeat  . A bad rash all over your body  . Dizziness and weakness    Immunizations Administered    Name Date Dose VIS Date Route   Pfizer COVID-19 Vaccine 06/30/2019  3:55 PM 0.3 mL 05/03/2019 Intramuscular   Manufacturer: [place]   Lot: [id number]   [place]: [id number]

## 2019-07-02 ENCOUNTER — Encounter: Payer: Self-pay | Admitting: Family Medicine

## 2019-07-03 ENCOUNTER — Telehealth: Payer: Self-pay | Admitting: *Deleted

## 2019-07-03 MED ORDER — ALENDRONATE SODIUM 70 MG PO TABS: ORAL_TABLET | ORAL | 0 refills | Status: DC

## 2019-07-03 NOTE — Telephone Encounter (Signed)
Patient called requesting refill on fosamax 70 mg tablet, per note on 06/26/18 "Started on alendronate last year and is done well."  Annual exam scheduled on 07/11/19,Rx sent.

## 2019-07-04 ENCOUNTER — Ambulatory Visit (INDEPENDENT_AMBULATORY_CARE_PROVIDER_SITE_OTHER): Payer: Medicare Other | Admitting: Family Medicine

## 2019-07-04 ENCOUNTER — Other Ambulatory Visit: Payer: Self-pay

## 2019-07-04 ENCOUNTER — Encounter: Payer: Self-pay | Admitting: Family Medicine

## 2019-07-04 VITALS — BP 122/70 | HR 64 | Temp 97.4°F | Resp 14 | Ht 64.0 in | Wt 124.0 lb

## 2019-07-04 DIAGNOSIS — R079 Chest pain, unspecified: Secondary | ICD-10-CM

## 2019-07-04 DIAGNOSIS — Z1231 Encounter for screening mammogram for malignant neoplasm of breast: Secondary | ICD-10-CM | POA: Diagnosis not present

## 2019-07-04 LAB — HM MAMMOGRAPHY

## 2019-07-04 MED ORDER — PANTOPRAZOLE SODIUM 40 MG PO TBEC: 40.0000 mg | DELAYED_RELEASE_TABLET | Freq: Every day | ORAL | 3 refills | Status: DC

## 2019-07-04 NOTE — Progress Notes (Signed)
Subjective:    Patient ID: Annette Barker, female    DOB: Jun 06, 1945, 74 y.o.   MRN: [id number]  HPI 2 days ago, the patient awoke from a deep sleep complaining of pain in the center of her chest into the left side of her chest.  She states that it felt tight.  She also reported feeling indigestion.  She states that ever since then she has felt a constant soreness in the left side of her chest.  She places her hand over her left breast and wraps around her ribs towards her scapula.  She states that it is sore and feels tight.  There are no alleviating factors.  There are no exacerbating factors.  She denies any angina.  There is no association of the chest tightness with exercise.  There is no shortness of breath or dyspnea on exertion.  She exercises daily and walks her dog daily without any difficulty.  She denies any nausea or vomiting.  She has been having more indigestion recently.  She is also on Fosamax.  She did get the COVID-19 vaccine Sunday and she is questioning if this may have triggered the reaction.  She also has been having more anxiety.  EKG today shows normal sinus rhythm with no evidence of ischemia or infarction.  She does have T wave inversions in leads V3 and V4 but these are chronic findings that were present on previous EKGs.  There has been no significant change in her EKG. Past Medical History:  Diagnosis Date  . Anxiety   . Arthritis    in her hands   . Elevated cholesterol   . Hair loss   . Hemorrhoids   . Hyperlipidemia   . Hypothyroidism    on synthroid  . IBS (irritable bowel syndrome)   . Osteoporosis 03/2017   T score -3.2  . Thyroid disease    hypothyroid   Past Surgical History:  Procedure Laterality Date  . LAPAROSCOPIC SALPINGO OOPHERECTOMY Bilateral 10/29/2014   Procedure: LAPAROSCOPIC SALPINGO OOPHORECTOMY;  Surgeon: [name], MD;  Location: [facility];  Service: Gynecology;  Laterality: Bilateral;  1:00pm OR time requested  1 1/2 hours OR  time requested.  Marland Kitchen SHOULDER SURGERY    . TUBAL LIGATION     Current Outpatient Medications on File Prior to Visit  Medication Sig Dispense Refill  . alendronate (FOSAMAX) 70 MG tablet TAKE 1 TABLET BY MOUTH EVERY 7 DAYS WITH A FULL GLASS OF WATER ON AN EMPTY STOMACH 4 tablet 0  . diazepam (VALIUM) 5 MG tablet TAKE 1 TABLET BY MOUTH EVERYDAY AT BEDTIME 30 tablet 2  . levothyroxine (SYNTHROID) 75 MCG tablet TAKE 1 TABLET(75 MCG) BY MOUTH DAILY BEFORE BREAKFAST 90 tablet 0  . predniSONE (DELTASONE) 1 MG tablet 1 mg.     . rosuvastatin (CRESTOR) 10 MG tablet Take 1 tablet (10 mg total) by mouth daily. (Patient not taking: Reported on 07/04/2019) 90 tablet 3   No current facility-administered medications on file prior to visit.   Allergies  Allergen Reactions  . Hydromet [Hydrocodone-Homatropine] Itching and Swelling  . Statins Other (See Comments)    Body cramps all over body   Social History   Socioeconomic History  . Marital status: Married    Spouse name: Not on file  . Number of children: Not on file  . Years of education: Not on file  . Highest education level: Not on file  Occupational History  . Not on file  Tobacco Use  .  Smoking status: Former Smoker    Types: Cigarettes    Quit date: 05/27/1991    Years since quitting: 28.1  . Smokeless tobacco: Never Used  Substance and Sexual Activity  . Alcohol use: Yes    Alcohol/week: 0.0 standard drinks    Comment: rare  . Drug use: No  . Sexual activity: Yes    Birth control/protection: Post-menopausal    Comment: 1st intercourse 74 yo--Fewer than 5 partners  Other Topics Concern  . Not on file  Social History Narrative  . Not on file   Social Determinants of Health   Financial Resource Strain:   . Difficulty of Paying Living Expenses: Not on file  Food Insecurity:   . Worried About Charity fundraiser in the Last Year: Not on file  . Ran Out of Food in the Last Year: Not on file  Transportation Needs:   . Lack of  Transportation (Medical): Not on file  . Lack of Transportation (Non-Medical): Not on file  Physical Activity:   . Days of Exercise per Week: Not on file  . Minutes of Exercise per Session: Not on file  Stress:   . Feeling of Stress : Not on file  Social Connections:   . Frequency of Communication with Friends and Family: Not on file  . Frequency of Social Gatherings with Friends and Family: Not on file  . Attends Religious Services: Not on file  . Active Member of Clubs or Organizations: Not on file  . Attends Archivist Meetings: Not on file  . Marital Status: Not on file  Intimate Partner Violence:   . Fear of Current or Ex-Partner: Not on file  . Emotionally Abused: Not on file  . Physically Abused: Not on file  . Sexually Abused: Not on file      Review of Systems  All other systems reviewed and are negative.      Objective:   Physical Exam Vitals reviewed.  Constitutional:      General: She is not in acute distress.    Appearance: Normal appearance. She is not ill-appearing, toxic-appearing or diaphoretic.  HENT:     Head: Normocephalic and atraumatic.     Right Ear: Tympanic membrane and ear canal normal. There is no impacted cerumen.     Left Ear: Tympanic membrane and ear canal normal. There is no impacted cerumen.     Nose: Nose normal. No congestion or rhinorrhea.     Mouth/Throat:     Mouth: Mucous membranes are moist.     Pharynx: No oropharyngeal exudate or posterior oropharyngeal erythema.  Eyes:     General: No scleral icterus.       Right eye: No discharge.        Left eye: No discharge.     Extraocular Movements: Extraocular movements intact.     Conjunctiva/sclera: Conjunctivae normal.     Pupils: Pupils are equal, round, and reactive to light.  Cardiovascular:     Rate and Rhythm: Normal rate and regular rhythm.     Pulses: Normal pulses.     Heart sounds: Normal heart sounds. No murmur. No friction rub. No gallop.   Pulmonary:      Effort: Pulmonary effort is normal. No respiratory distress.     Breath sounds: Normal breath sounds. No stridor. No wheezing, rhonchi or rales.  Chest:     Chest wall: No tenderness.  Abdominal:     General: Bowel sounds are normal.     Palpations:  Abdomen is soft.     Tenderness: There is no abdominal tenderness. There is no right CVA tenderness, left CVA tenderness, guarding or rebound.  Musculoskeletal:     Cervical back: Neck supple. No rigidity.     Right lower leg: No edema.     Left lower leg: No edema.  Lymphadenopathy:     Cervical: No cervical adenopathy.  Skin:    Coloration: Skin is not jaundiced.     Findings: No erythema or rash.  Neurological:     General: No focal deficit present.     Mental Status: She is alert and oriented to person, place, and time. Mental status is at baseline.     Motor: No weakness.     Coordination: Coordination normal.           Assessment & Plan:  Chest pain, unspecified type - Plan: EKG 12-Lead  Patient is resting comfortably in the room in no apparent distress.  She denies any shortness of breath.  There is no exercise component to the chest pain.  Chest pain is extremely atypical in nature.  I believe this is most likely musculoskeletal chest wall pain brought on by anxiety or perhaps GI related such as gastritis.  Begin Protonix 40 mg daily.  Begin Valium 5 mg twice daily.  Reassess on Monday.  If chest pain continues, consider cardiology work-up however at the present time I believe this is most likely either musculoskeletal brought on by anxiety or perhaps GERD.  Patient is instructed to go immediately to the emergency room if chest pain worsens.

## 2019-07-09 ENCOUNTER — Telehealth: Payer: Self-pay | Admitting: Family Medicine

## 2019-07-09 NOTE — Telephone Encounter (Signed)
agree

## 2019-07-09 NOTE — Telephone Encounter (Signed)
Pt called and states that sh was supposed to let you know how she was doing. She took the valium bid x the first day and the next day the chest pain was gone. She did not get the reflux medication as the CP was gone and she does not have any reflux symptoms. If it happens again she will take the valium.

## 2019-07-11 ENCOUNTER — Encounter: Payer: Medicare Other | Admitting: Obstetrics and Gynecology

## 2019-07-16 ENCOUNTER — Ambulatory Visit: Payer: Medicare Other

## 2019-07-24 ENCOUNTER — Ambulatory Visit: Payer: Medicare Other

## 2019-07-24 ENCOUNTER — Ambulatory Visit: Payer: Medicare Other | Attending: Internal Medicine

## 2019-07-24 DIAGNOSIS — Z23 Encounter for immunization: Secondary | ICD-10-CM | POA: Insufficient documentation

## 2019-07-24 NOTE — Progress Notes (Signed)
   Covid-19 Vaccination Clinic  Name:  Annette Barker    MRN: [id number] DOB: 08/05/1945  07/24/2019  [name] was observed post Covid-19 immunization for 30 minutes based on pre-vaccination screening without incident. She was provided with Vaccine Information Sheet and instruction to access the V-Safe system.   [name] was instructed to call 911 with any severe reactions post vaccine: Marland Kitchen Difficulty breathing  . Swelling of face and throat  . A fast heartbeat  . A bad rash all over body  . Dizziness and weakness   Immunizations Administered    Name Date Dose VIS Date Route   Pfizer COVID-19 Vaccine 07/24/2019  4:09 PM 0.3 mL 05/03/2019 Intramuscular   Manufacturer: [place]   Lot: [id number]   [place]: [id number]

## 2019-07-25 ENCOUNTER — Ambulatory Visit: Payer: Medicare Other

## 2019-07-26 ENCOUNTER — Other Ambulatory Visit: Payer: Self-pay | Admitting: Family Medicine

## 2019-07-29 ENCOUNTER — Other Ambulatory Visit: Payer: Self-pay

## 2019-07-30 ENCOUNTER — Ambulatory Visit (INDEPENDENT_AMBULATORY_CARE_PROVIDER_SITE_OTHER): Payer: Medicare Other | Admitting: Obstetrics and Gynecology

## 2019-07-30 ENCOUNTER — Encounter: Payer: Self-pay | Admitting: Obstetrics and Gynecology

## 2019-07-30 VITALS — BP 124/78 | Ht 64.0 in | Wt 126.0 lb

## 2019-07-30 DIAGNOSIS — H2513 Age-related nuclear cataract, bilateral: Secondary | ICD-10-CM | POA: Diagnosis not present

## 2019-07-30 DIAGNOSIS — Z01419 Encounter for gynecological examination (general) (routine) without abnormal findings: Secondary | ICD-10-CM

## 2019-07-30 DIAGNOSIS — H353131 Nonexudative age-related macular degeneration, bilateral, early dry stage: Secondary | ICD-10-CM | POA: Diagnosis not present

## 2019-07-30 DIAGNOSIS — M810 Age-related osteoporosis without current pathological fracture: Secondary | ICD-10-CM

## 2019-07-30 DIAGNOSIS — H25013 Cortical age-related cataract, bilateral: Secondary | ICD-10-CM | POA: Diagnosis not present

## 2019-07-30 DIAGNOSIS — H43813 Vitreous degeneration, bilateral: Secondary | ICD-10-CM | POA: Diagnosis not present

## 2019-07-30 NOTE — Progress Notes (Signed)
Annette Barker 10-05-1945 [id number]  SUBJECTIVE:  74 y.o. [id number] female for annual routine gynecologic exam. She has no gynecologic concerns.  She had been taking Fosamax and been doing fine, she says after she got her first COVID-19 vaccination she then developed significant acid reflux which has continued.  Her primary care doctor suggested that she stop the Fosamax which she has.  She has continued to have acid reflux despite taking H2 blockers.  She has not started taking the prescribed Protonix at this point.  Current Outpatient Medications  Medication Sig Dispense Refill  . alendronate (FOSAMAX) 70 MG tablet TAKE 1 TABLET BY MOUTH EVERY 7 DAYS WITH A FULL GLASS OF WATER ON AN EMPTY STOMACH 4 tablet 0  . diazepam (VALIUM) 5 MG tablet TAKE 1 TABLET BY MOUTH EVERYDAY AT BEDTIME 30 tablet 2  . levothyroxine (SYNTHROID) 75 MCG tablet TAKE 1 TABLET(75 MCG) BY MOUTH DAILY BEFORE BREAKFAST 90 tablet 2  . pantoprazole (PROTONIX) 40 MG tablet Take 1 tablet (40 mg total) by mouth daily. 30 tablet 3  . predniSONE (DELTASONE) 1 MG tablet 1 mg.     . rosuvastatin (CRESTOR) 10 MG tablet Take 1 tablet (10 mg total) by mouth daily. (Patient not taking: Reported on 07/04/2019) 90 tablet 3   No current facility-administered medications for this visit.   Allergies: Hydromet [hydrocodone-homatropine] and Statins  Patient's last menstrual period was 05/23/1998.  Past medical history,surgical history, problem list, medications, allergies, family history and social history were all reviewed and documented as reviewed in the EPIC chart.  ROS:  Feeling well. No dyspnea or chest pain on exertion.  No abdominal pain, change in bowel habits, black or bloody stools.  No urinary tract symptoms. GYN ROS: no abnormal bleeding, pelvic pain or discharge, no breast pain or new or enlarging lumps on self exam. No neurological complaints.   OBJECTIVE:  Ht 5\' 4"  (1.626 m)   Wt 126 lb (57.2 kg)   LMP 05/23/1998   BMI  21.63 kg/m  The patient appears well, alert, oriented x 3, in no distress. ENT normal.  Neck supple. No cervical or supraclavicular adenopathy or thyromegaly.  Lungs are clear, good air entry, no wheezes, rhonchi or rales. S1 and S2 normal, no murmurs, regular rate and rhythm.  Abdomen soft without tenderness, guarding, mass or organomegaly.  Neurological is normal, no focal findings.  BREAST EXAM: breasts appear normal, no suspicious masses, no skin or nipple changes or axillary nodes  PELVIC EXAM: VULVA: normal appearing vulva with no masses, tenderness or lesions, VAGINA: normal appearing vagina with normal color and discharge, no lesions, CERVIX: normal appearing cervix without discharge or lesions, UTERUS: uterus is normal size, shape, consistency and nontender, ADNEXA: normal adnexa in size, nontender and no masses  Chaperone: [name] present during the examination  ASSESSMENT:  74 y.o. [id number] here for annual gynecologic exam  PLAN:  1. Postmenopausal.  States she did discontinue Estrogel and Prometrium.  Doing fine with no significant vasomotor symptoms.  No vaginal bleeding. 2. Pap smear 05/2014.  No history of abnormal Pap smears.  Is comfortable with not continuing screening at this time. 3. Mammogram 06/2019.  Normal breast exam today.  We will continue with annual mammography. 4. Colonoscopy 2017.  Recommended that she follow up at the recommended interval.  5. Osteoporosis.  DEXA 06/2019. T-score -3.1 at left femoral neck. Overall improvement noted in BMD.    Had started alendronate 2019 and did well but then recently discontinued at the  advice of her primary care doctor due to acid reflux issues.  She has been more physically active than previously with weightbearing exercise.  If her reflux does settle down, I would like her to go back on alendronate.  Her next DEXA is recommended in 2 years at 2023 so she will plan to schedule this when due. 6. Health maintenance.  No labs  today as she normally has these completed with her primary care doctor.  Return annually or sooner, prn.  Joseph Pierini MD  07/30/19

## 2019-08-15 ENCOUNTER — Encounter: Payer: Self-pay | Admitting: *Deleted

## 2019-09-03 ENCOUNTER — Other Ambulatory Visit: Payer: Self-pay | Admitting: Family Medicine

## 2019-09-03 NOTE — Telephone Encounter (Signed)
Ok to refill??  Last office visit 07/04/2019.  Last refill 12/31/2018, #2 refills.

## 2019-09-16 DIAGNOSIS — Z6822 Body mass index (BMI) 22.0-22.9, adult: Secondary | ICD-10-CM | POA: Diagnosis not present

## 2019-09-16 DIAGNOSIS — M353 Polymyalgia rheumatica: Secondary | ICD-10-CM | POA: Diagnosis not present

## 2019-09-16 DIAGNOSIS — M2550 Pain in unspecified joint: Secondary | ICD-10-CM | POA: Diagnosis not present

## 2019-09-16 DIAGNOSIS — Z7952 Long term (current) use of systemic steroids: Secondary | ICD-10-CM | POA: Diagnosis not present

## 2019-09-16 DIAGNOSIS — M810 Age-related osteoporosis without current pathological fracture: Secondary | ICD-10-CM | POA: Diagnosis not present

## 2019-09-16 DIAGNOSIS — M722 Plantar fascial fibromatosis: Secondary | ICD-10-CM | POA: Diagnosis not present

## 2019-10-02 ENCOUNTER — Encounter: Payer: Self-pay | Admitting: Family Medicine

## 2019-10-14 ENCOUNTER — Ambulatory Visit: Payer: Medicare Other | Admitting: Family Medicine

## 2020-01-05 ENCOUNTER — Encounter: Payer: Self-pay | Admitting: Family Medicine

## 2020-01-07 ENCOUNTER — Other Ambulatory Visit: Payer: Self-pay

## 2020-01-07 ENCOUNTER — Ambulatory Visit (INDEPENDENT_AMBULATORY_CARE_PROVIDER_SITE_OTHER): Payer: Medicare Other | Admitting: Family Medicine

## 2020-01-07 VITALS — Temp 95.3°F | Ht 64.0 in | Wt 127.0 lb

## 2020-01-07 DIAGNOSIS — M898X1 Other specified disorders of bone, shoulder: Secondary | ICD-10-CM

## 2020-01-07 NOTE — Progress Notes (Signed)
Subjective:    Patient ID: Annette Barker, female    DOB: [birth year], 74 y.o.   MRN: [id number]  HPI Sometime in June, the patient was playing with her grandson walking over slick rocks in a river.  She slipped and struck the left side of her chest against the rock.  She is sure that she fractured some ribs when that happened and she bruised her entire left side of her chest.  Recently over the last few days she has noticed a prominent firm mass at the proximal end of her left clavicle.  There is a visible discrepancy between her left and right clavicle.  The proximal portion of her left clavicle is more prominent.  There is a slight depression adjacent to that before the main body of the clavicle suggesting a mild separation.  The area is nontender.  There is no bruising.  There is no erythema.  The prominent portion is roughly 1 cm in diameter.  It is not mobile.  Patient denies any cough or pleurisy or hemoptysis Past Medical History:  Diagnosis Date  . Anxiety   . Arthritis    in her hands   . Elevated cholesterol   . Hair loss   . Hemorrhoids   . Hyperlipidemia   . Hypothyroidism    on synthroid  . IBS (irritable bowel syndrome)   . Lyme disease   . Osteoporosis 03/2017   T score -3.2  . Thyroid disease    hypothyroid   Past Surgical History:  Procedure Laterality Date  . LAPAROSCOPIC SALPINGO OOPHERECTOMY Bilateral 10/29/2014   Procedure: LAPAROSCOPIC SALPINGO OOPHORECTOMY;  Surgeon: [name], MD;  Location: [facility];  Service: Gynecology;  Laterality: Bilateral;  1:00pm OR time requested  1 1/2 hours OR time requested.  Marland Kitchen SHOULDER SURGERY    . TUBAL LIGATION     Current Outpatient Medications on File Prior to Visit  Medication Sig Dispense Refill  . diazepam (VALIUM) 5 MG tablet TAKE 1 TABLET BY MOUTH EVERYDAY AT BEDTIME 30 tablet 2  . diphenhydrAMINE HCl (BENADRYL PO) Take by mouth.    . levothyroxine (SYNTHROID) 75 MCG tablet TAKE 1 TABLET(75 MCG) BY MOUTH DAILY  BEFORE BREAKFAST 90 tablet 2  . predniSONE (DELTASONE) 1 MG tablet 1 mg.     . alendronate (FOSAMAX) 70 MG tablet TAKE 1 TABLET BY MOUTH EVERY 7 DAYS WITH A FULL GLASS OF WATER ON AN EMPTY STOMACH (Patient not taking: Reported on 07/30/2019) 4 tablet 0  . pantoprazole (PROTONIX) 40 MG tablet Take 1 tablet (40 mg total) by mouth daily. (Patient not taking: Reported on 07/30/2019) 30 tablet 3  . rosuvastatin (CRESTOR) 10 MG tablet Take 1 tablet (10 mg total) by mouth daily. (Patient not taking: Reported on 01/07/2020) 90 tablet 3   No current facility-administered medications on file prior to visit.   Allergies  Allergen Reactions  . Hydromet [Hydrocodone-Homatropine] Itching and Swelling  . Statins Other (See Comments)    Body cramps all over body   Social History   Socioeconomic History  . Marital status: Widowed    Spouse name: Not on file  . Number of children: Not on file  . Years of education: Not on file  . Highest education level: Not on file  Occupational History  . Not on file  Tobacco Use  . Smoking status: Former Smoker    Types: Cigarettes    Quit date: 05/27/1991    Years since quitting: 28.6  . Smokeless tobacco:  Never Used  Vaping Use  . Vaping Use: Never used  Substance and Sexual Activity  . Alcohol use: Yes    Alcohol/week: 0.0 standard drinks    Comment: rare  . Drug use: No  . Sexual activity: Not Currently    Birth control/protection: Post-menopausal    Comment: 1st intercourse 74 yo--Fewer than 5 partners  Other Topics Concern  . Not on file  Social History Narrative  . Not on file   Social Determinants of Health   Financial Resource Strain:   . Difficulty of Paying Living Expenses:   Food Insecurity:   . Worried About Charity fundraiser in the Last Year:   . Arboriculturist in the Last Year:   Transportation Needs:   . Film/video editor (Medical):   Marland Kitchen Lack of Transportation (Non-Medical):   Physical Activity:   . Days of Exercise per Week:    . Minutes of Exercise per Session:   Stress:   . Feeling of Stress :   Social Connections:   . Frequency of Communication with Friends and Family:   . Frequency of Social Gatherings with Friends and Family:   . Attends Religious Services:   . Active Member of Clubs or Organizations:   . Attends Archivist Meetings:   Marland Kitchen Marital Status:   Intimate Partner Violence:   . Fear of Current or Ex-Partner:   . Emotionally Abused:   Marland Kitchen Physically Abused:   . Sexually Abused:       Review of Systems  All other systems reviewed and are negative.      Objective:   Physical Exam Vitals reviewed.  Constitutional:      General: She is not in acute distress.    Appearance: Normal appearance. She is not ill-appearing, toxic-appearing or diaphoretic.  HENT:     Mouth/Throat:     Mouth: Mucous membranes are moist.     Pharynx: No oropharyngeal exudate or posterior oropharyngeal erythema.  Eyes:     General: No scleral icterus.       Right eye: No discharge.        Left eye: No discharge.     Extraocular Movements: Extraocular movements intact.     Conjunctiva/sclera: Conjunctivae normal.     Pupils: Pupils are equal, round, and reactive to light.  Cardiovascular:     Rate and Rhythm: Normal rate and regular rhythm.     Pulses: Normal pulses.     Heart sounds: Normal heart sounds. No murmur heard.  No friction rub. No gallop.   Pulmonary:     Effort: Pulmonary effort is normal. No respiratory distress.     Breath sounds: Normal breath sounds. No stridor. No wheezing, rhonchi or rales.  Chest:     Chest wall: Mass and deformity present. No tenderness.    Abdominal:     General: Bowel sounds are normal.     Palpations: Abdomen is soft.     Tenderness: There is no abdominal tenderness. There is no right CVA tenderness, left CVA tenderness, guarding or rebound.  Musculoskeletal:     Cervical back: Neck supple. No rigidity.     Right lower leg: No edema.     Left lower  leg: No edema.  Lymphadenopathy:     Cervical: No cervical adenopathy.  Skin:    Coloration: Skin is not jaundiced.     Findings: No erythema or rash.  Neurological:     General: No focal deficit present.  Mental Status: She is alert and oriented to person, place, and time. Mental status is at baseline.     Motor: No weakness.     Coordination: Coordination normal.           Assessment & Plan:  Pain of left clavicle - Plan: DG Clavicle Left  There is a visible asymmetry between her left and right clavicle.  I believe the patient may have slightly fractured the proximal portion of her clavicle when she fell this summer and there may be a nonunion at the fracture site causing the visible deformity.  Therefore I recommended an x-ray of the clavicle to evaluate further.  Less likely would be some type of skeletal malignancy.

## 2020-01-08 ENCOUNTER — Ambulatory Visit
Admission: RE | Admit: 2020-01-08 | Discharge: 2020-01-08 | Disposition: A | Discharge status: Home / Self Care | Payer: Medicare Other | Source: Ambulatory Visit | Attending: Family Medicine | Admitting: Family Medicine

## 2020-01-08 DIAGNOSIS — M898X1 Other specified disorders of bone, shoulder: Secondary | ICD-10-CM

## 2020-01-08 DIAGNOSIS — M25512 Pain in left shoulder: Secondary | ICD-10-CM | POA: Diagnosis not present

## 2020-01-15 ENCOUNTER — Encounter: Payer: Self-pay | Admitting: Family Medicine

## 2020-01-16 ENCOUNTER — Other Ambulatory Visit: Payer: Self-pay | Admitting: Family Medicine

## 2020-01-16 DIAGNOSIS — M353 Polymyalgia rheumatica: Secondary | ICD-10-CM | POA: Diagnosis not present

## 2020-01-16 DIAGNOSIS — Z7952 Long term (current) use of systemic steroids: Secondary | ICD-10-CM | POA: Diagnosis not present

## 2020-01-16 DIAGNOSIS — Z6821 Body mass index (BMI) 21.0-21.9, adult: Secondary | ICD-10-CM | POA: Diagnosis not present

## 2020-01-16 DIAGNOSIS — M2550 Pain in unspecified joint: Secondary | ICD-10-CM | POA: Diagnosis not present

## 2020-01-16 MED ORDER — TRIAMCINOLONE ACETONIDE 0.1 % EX CREA: 1.0000 "application " | TOPICAL_CREAM | Freq: Two times a day (BID) | CUTANEOUS | 0 refills | Status: AC

## 2020-02-19 ENCOUNTER — Encounter: Payer: Self-pay | Admitting: Family Medicine

## 2020-02-26 ENCOUNTER — Other Ambulatory Visit: Payer: Self-pay

## 2020-02-26 MED ORDER — LEVOTHYROXINE SODIUM 75 MCG PO TABS: ORAL_TABLET | ORAL | 2 refills | Status: DC

## 2020-03-01 ENCOUNTER — Encounter: Payer: Self-pay | Admitting: Family Medicine

## 2020-03-02 MED ORDER — DIAZEPAM 5 MG PO TABS: ORAL_TABLET | ORAL | 2 refills | Status: DC

## 2020-03-02 NOTE — Telephone Encounter (Signed)
Ok to refill??  Last office visit 01/07/2020.  Last refill 09/03/2019, #2 refills.

## 2020-03-03 ENCOUNTER — Encounter: Payer: Self-pay | Admitting: Family Medicine

## 2020-03-03 MED ORDER — DIAZEPAM 5 MG PO TABS: ORAL_TABLET | ORAL | 2 refills | Status: DC

## 2020-03-03 NOTE — Telephone Encounter (Signed)
Ok to re-send to pharmacy in [facility]?

## 2020-03-09 ENCOUNTER — Telehealth: Payer: Self-pay | Admitting: Family Medicine

## 2020-03-09 NOTE — Telephone Encounter (Signed)
#   [phone] Pt drop off [employer] Pt Assistance Program Application to be filled out by Dr.Pickard form place in the [employer]

## 2020-03-19 ENCOUNTER — Ambulatory Visit (INDEPENDENT_AMBULATORY_CARE_PROVIDER_SITE_OTHER): Payer: Medicare Other | Admitting: Family Medicine

## 2020-03-19 ENCOUNTER — Other Ambulatory Visit: Payer: Self-pay

## 2020-03-19 ENCOUNTER — Encounter: Payer: Self-pay | Admitting: Family Medicine

## 2020-03-19 VITALS — BP 120/80 | HR 67 | Temp 98.1°F | Resp 18 | Ht 64.0 in | Wt 115.0 lb

## 2020-03-19 DIAGNOSIS — R239 Unspecified skin changes: Secondary | ICD-10-CM | POA: Diagnosis not present

## 2020-03-19 DIAGNOSIS — Z23 Encounter for immunization: Secondary | ICD-10-CM | POA: Diagnosis not present

## 2020-03-19 DIAGNOSIS — R634 Abnormal weight loss: Secondary | ICD-10-CM

## 2020-03-19 NOTE — Progress Notes (Signed)
Subjective:    Patient ID: Annette Barker, female    DOB: June 19, 1945, 74 y.o.   MRN: [id number]  HPI  01/07/20 Sometime in June, the patient was playing with her grandson walking over slick rocks in a river.  She slipped and struck the left side of her chest against the rock.  She is sure that she fractured some ribs when that happened and she bruised her entire left side of her chest.  Recently over the last few days she has noticed a prominent firm mass at the proximal end of her left clavicle.  There is a visible discrepancy between her left and right clavicle.  The proximal portion of her left clavicle is more prominent.  There is a slight depression adjacent to that before the main body of the clavicle suggesting a mild separation.  The area is nontender.  There is no bruising.  There is no erythema.  The prominent portion is roughly 1 cm in diameter.  It is not mobile.  Patient denies any cough or pleurisy or hemoptysis.  At that time, my plan was: There is a visible asymmetry between her left and right clavicle.  I believe the patient may have slightly fractured the proximal portion of her clavicle when she fell this summer and there may be a nonunion at the fracture site causing the visible deformity.  Therefore I recommended an x-ray of the clavicle to evaluate further.  Less likely would be some type of skeletal malignancy.  03/19/20 Wt Readings from Last 3 Encounters:  03/19/20 115 lb (52.2 kg)  01/07/20 127 lb (57.6 kg)  07/30/19 126 lb (57.2 kg)   Patient presents for two concerns today. First she has lost approximately 11 lbs since I last saw her. She attributes it to stress. She has been moving. She is preparing for the move and has been very anxious. She thinks this may have affected her appetite. Regarding her cancer screening, she had a mammogram performed earlier this year that was normal. She had a colonoscopy which is up-to-date. Her ovaries have been removed previously. She  denies any blood in her stool or vaginal bleeding or cough or breast pain. She denies any polyuria, polydipsia, or blurry vision however she would like lab work to rule out other potential causes of weight loss. Second issue is she has a soft mass on her left bicep. It is just above the antecubital fossa. It is horizontal in position. It is columnar in shape and feels like a varicose vein. It is spongy well-circumscribed and tortuous. It is just below the surface of the skin and runs horizontal from the lateral epicondyle towards the medial epicondyle  Past Medical History:  Diagnosis Date  . Anxiety   . Arthritis    in her hands   . Elevated cholesterol   . Hair loss   . Hemorrhoids   . Hyperlipidemia   . Hypothyroidism    on synthroid  . IBS (irritable bowel syndrome)   . Lyme disease   . Osteoporosis 03/2017   T score -3.2  . Thyroid disease    hypothyroid   Past Surgical History:  Procedure Laterality Date  . LAPAROSCOPIC SALPINGO OOPHERECTOMY Bilateral 10/29/2014   Procedure: LAPAROSCOPIC SALPINGO OOPHORECTOMY;  Surgeon: [name], MD;  Location: [facility];  Service: Gynecology;  Laterality: Bilateral;  1:00pm OR time requested  1 1/2 hours OR time requested.  Marland Kitchen SHOULDER SURGERY    . TUBAL LIGATION     Current  Outpatient Medications on File Prior to Visit  Medication Sig Dispense Refill  . alendronate (FOSAMAX) 70 MG tablet TAKE 1 TABLET BY MOUTH EVERY 7 DAYS WITH A FULL GLASS OF WATER ON AN EMPTY STOMACH (Patient not taking: Reported on 07/30/2019) 4 tablet 0  . diazepam (VALIUM) 5 MG tablet TAKE 1 TABLET BY MOUTH EVERYDAY AT BEDTIME 30 tablet 2  . diphenhydrAMINE HCl (BENADRYL PO) Take by mouth.    . levothyroxine (SYNTHROID) 75 MCG tablet TAKE 1 TABLET 7MCG BY MOUTH DAILY BEFORE BREAKFAST 90 tablet 2  . pantoprazole (PROTONIX) 40 MG tablet Take 1 tablet (40 mg total) by mouth daily. (Patient not taking: Reported on 07/30/2019) 30 tablet 3  . predniSONE (DELTASONE) 1 MG  tablet 1 mg.     . rosuvastatin (CRESTOR) 10 MG tablet Take 1 tablet (10 mg total) by mouth daily. (Patient not taking: Reported on 01/07/2020) 90 tablet 3  . triamcinolone cream (KENALOG) 0.1 % Apply 1 application topically 2 (two) times daily. 30 g 0   No current facility-administered medications on file prior to visit.   Allergies  Allergen Reactions  . Hydromet [Hydrocodone-Homatropine] Itching and Swelling  . Statins Other (See Comments)    Body cramps all over body   Social History   Socioeconomic History  . Marital status: Widowed    Spouse name: Not on file  . Number of children: Not on file  . Years of education: Not on file  . Highest education level: Not on file  Occupational History  . Not on file  Tobacco Use  . Smoking status: Former Smoker    Types: Cigarettes    Quit date: 05/27/1991    Years since quitting: 28.8  . Smokeless tobacco: Never Used  Vaping Use  . Vaping Use: Never used  Substance and Sexual Activity  . Alcohol use: Yes    Alcohol/week: 0.0 standard drinks    Comment: rare  . Drug use: No  . Sexual activity: Not Currently    Birth control/protection: Post-menopausal    Comment: 1st intercourse 74 yo--Fewer than 5 partners  Other Topics Concern  . Not on file  Social History Narrative  . Not on file   Social Determinants of Health   Financial Resource Strain:   . Difficulty of Paying Living Expenses: Not on file  Food Insecurity:   . Worried About Charity fundraiser in the Last Year: Not on file  . Ran Out of Food in the Last Year: Not on file  Transportation Needs:   . Lack of Transportation (Medical): Not on file  . Lack of Transportation (Non-Medical): Not on file  Physical Activity:   . Days of Exercise per Week: Not on file  . Minutes of Exercise per Session: Not on file  Stress:   . Feeling of Stress : Not on file  Social Connections:   . Frequency of Communication with Friends and Family: Not on file  . Frequency of Social  Gatherings with Friends and Family: Not on file  . Attends Religious Services: Not on file  . Active Member of Clubs or Organizations: Not on file  . Attends Archivist Meetings: Not on file  . Marital Status: Not on file  Intimate Partner Violence:   . Fear of Current or Ex-Partner: Not on file  . Emotionally Abused: Not on file  . Physically Abused: Not on file  . Sexually Abused: Not on file      Review of Systems  All  other systems reviewed and are negative.      Objective:   Physical Exam Vitals reviewed.  Constitutional:      General: She is not in acute distress.    Appearance: Normal appearance. She is not ill-appearing, toxic-appearing or diaphoretic.  HENT:     Mouth/Throat:     Mouth: Mucous membranes are moist.     Pharynx: No oropharyngeal exudate or posterior oropharyngeal erythema.  Eyes:     General: No scleral icterus.       Right eye: No discharge.        Left eye: No discharge.     Extraocular Movements: Extraocular movements intact.     Conjunctiva/sclera: Conjunctivae normal.     Pupils: Pupils are equal, round, and reactive to light.  Cardiovascular:     Rate and Rhythm: Normal rate and regular rhythm.     Pulses: Normal pulses.     Heart sounds: Normal heart sounds. No murmur heard.  No friction rub. No gallop.   Pulmonary:     Effort: Pulmonary effort is normal. No respiratory distress.     Breath sounds: Normal breath sounds. No stridor. No wheezing, rhonchi or rales.  Abdominal:     General: Bowel sounds are normal.     Palpations: Abdomen is soft.     Tenderness: There is no abdominal tenderness. There is no right CVA tenderness, left CVA tenderness, guarding or rebound.  Musculoskeletal:       Arms:     Cervical back: Neck supple. No rigidity.     Right lower leg: No edema.     Left lower leg: No edema.  Lymphadenopathy:     Cervical: No cervical adenopathy.  Skin:    Coloration: Skin is not jaundiced.     Findings: No  erythema or rash.  Neurological:     General: No focal deficit present.     Mental Status: She is alert and oriented to person, place, and time. Mental status is at baseline.     Motor: No weakness.     Coordination: Coordination normal.           Assessment & Plan:  Weight loss - Plan: COMPLETE METABOLIC PANEL WITH GFR, CBC with Differential/Platelet, TSH  Need for immunization against influenza - Plan: Flu Vaccine QUAD High Dose(Fluad)  The lesion on the arm feels like a varicose vein. It is below the surface of the skin so it is not visible but it is easily palpable. It does not have any concerning features to favor malignancy. We will simply monitor this at the present time. Regarding her weight loss, cancer screening is up-to-date. I will check a CBC, CMP, TSH. Consider chest x-ray if lab work is normal.

## 2020-03-20 LAB — CBC WITH DIFFERENTIAL/PLATELET
Absolute Monocytes: 596 cells/uL (ref 200–950)
Basophils Absolute: 59 cells/uL (ref 0–200)
Basophils Relative: 1 %
Eosinophils Absolute: 100 cells/uL (ref 15–500)
Eosinophils Relative: 1.7 %
HCT: 37.6 % (ref 35.0–45.0)
Hemoglobin: 12.3 g/dL (ref 11.7–15.5)
Lymphs Abs: 1888 cells/uL (ref 850–3900)
MCH: 32.5 pg (ref 27.0–33.0)
MCHC: 32.7 g/dL (ref 32.0–36.0)
MCV: 99.2 fL (ref 80.0–100.0)
MPV: 10 fL (ref 7.5–12.5)
Monocytes Relative: 10.1 %
Neutro Abs: 3257 cells/uL (ref 1500–7800)
Neutrophils Relative %: 55.2 %
Platelets: 312 10*3/uL (ref 140–400)
RBC: 3.79 10*6/uL — ABNORMAL LOW (ref 3.80–5.10)
RDW: 12.9 % (ref 11.0–15.0)
Total Lymphocyte: 32 %
WBC: 5.9 10*3/uL (ref 3.8–10.8)

## 2020-03-20 LAB — COMPLETE METABOLIC PANEL WITH GFR
AG Ratio: 1.5 (calc) (ref 1.0–2.5)
ALT: 10 U/L (ref 6–29)
AST: 15 U/L (ref 10–35)
Albumin: 4.2 g/dL (ref 3.6–5.1)
Alkaline phosphatase (APISO): 80 U/L (ref 37–153)
BUN: 17 mg/dL (ref 7–25)
CO2: 27 mmol/L (ref 20–32)
Calcium: 9.4 mg/dL (ref 8.6–10.4)
Chloride: 105 mmol/L (ref 98–110)
Creat: 0.85 mg/dL (ref 0.60–0.93)
GFR, Est African American: 79 mL/min/{1.73_m2} (ref >=60)
GFR, Est Non African American: 68 mL/min/{1.73_m2} (ref >=60)
Globulin: 2.8 g/dL (calc) (ref 1.9–3.7)
Glucose, Bld: 81 mg/dL (ref 65–99)
Potassium: 4.6 mmol/L (ref 3.5–5.3)
Sodium: 140 mmol/L (ref 135–146)
Total Bilirubin: 0.2 mg/dL (ref 0.2–1.2)
Total Protein: 7 g/dL (ref 6.1–8.1)

## 2020-03-20 LAB — TSH: TSH: 1.82 mIU/L (ref 0.40–4.50)

## 2020-04-10 DIAGNOSIS — Z23 Encounter for immunization: Secondary | ICD-10-CM | POA: Diagnosis not present

## 2020-04-11 ENCOUNTER — Encounter: Payer: Self-pay | Admitting: Family Medicine

## 2020-05-09 DIAGNOSIS — Z03818 Encounter for observation for suspected exposure to other biological agents ruled out: Secondary | ICD-10-CM | POA: Diagnosis not present

## 2020-05-09 DIAGNOSIS — Z1152 Encounter for screening for COVID-19: Secondary | ICD-10-CM | POA: Diagnosis not present

## 2020-05-09 DIAGNOSIS — Z20828 Contact with and (suspected) exposure to other viral communicable diseases: Secondary | ICD-10-CM | POA: Diagnosis not present

## 2020-05-11 ENCOUNTER — Encounter: Payer: Self-pay | Admitting: Family Medicine

## 2020-05-11 MED ORDER — DIAZEPAM 5 MG PO TABS
ORAL_TABLET | ORAL | 0 refills | Status: DC
Start: 2020-05-11 — End: 2020-09-07

## 2020-05-11 NOTE — Telephone Encounter (Signed)
Ok to refill Diazepam?? Last office visit 03/19/2020. Last refill 03/03/2020.  Please advise on Estrogen.

## 2020-05-29 DIAGNOSIS — Z03818 Encounter for observation for suspected exposure to other biological agents ruled out: Secondary | ICD-10-CM | POA: Diagnosis not present

## 2020-05-29 DIAGNOSIS — Z1152 Encounter for screening for COVID-19: Secondary | ICD-10-CM | POA: Diagnosis not present

## 2020-05-29 DIAGNOSIS — Z20828 Contact with and (suspected) exposure to other viral communicable diseases: Secondary | ICD-10-CM | POA: Diagnosis not present

## 2020-06-13 ENCOUNTER — Encounter: Payer: Self-pay | Admitting: Obstetrics and Gynecology

## 2020-06-16 ENCOUNTER — Other Ambulatory Visit: Payer: Self-pay

## 2020-06-16 MED ORDER — PROGESTERONE MICRONIZED 100 MG PO CAPS: 100.0000 mg | ORAL_CAPSULE | Freq: Every day | ORAL | 0 refills | Status: DC

## 2020-06-16 MED ORDER — PROGESTERONE MICRONIZED 100 MG PO CAPS
100.0000 mg | ORAL_CAPSULE | Freq: Every day | ORAL | 0 refills | Status: DC
Start: 2020-06-16 — End: 2020-06-16

## 2020-06-16 MED ORDER — ESTRADIOL 0.75 MG/1.25 GM (0.06%) TD GEL: 1.2500 g | Freq: Every day | TRANSDERMAL | 0 refills | Status: DC

## 2020-06-16 MED ORDER — ESTRADIOL 0.75 MG/1.25 GM (0.06%) TD GEL
1.2500 g | Freq: Every day | TRANSDERMAL | 0 refills | Status: DC
Start: 2020-06-16 — End: 2020-07-13

## 2020-06-16 MED ORDER — ESTRADIOL 0.75 MG/1.25 GM (0.06%) TD GEL
1.2500 g | Freq: Every day | TRANSDERMAL | 0 refills | Status: DC
Start: 2020-06-16 — End: 2020-06-16

## 2020-06-16 NOTE — Telephone Encounter (Signed)
Risks to include stroke heart attack DVT and breast cancer   If restarts should also be on prometrium 100 mg nightly as on her previous regimen

## 2020-06-16 NOTE — Addendum Note (Signed)
Addended by: Thamas Jaegers on: 06/16/2020 02:00 PM   Modules accepted: Orders

## 2020-06-20 ENCOUNTER — Encounter: Payer: Self-pay | Admitting: Family Medicine

## 2020-06-22 ENCOUNTER — Telehealth: Payer: Self-pay | Admitting: *Deleted

## 2020-06-22 DIAGNOSIS — U071 COVID-19: Secondary | ICD-10-CM

## 2020-06-22 NOTE — Telephone Encounter (Signed)
-----   Message from Susy Frizzle, MD sent at 06/22/2020  1:52 PM EST ----- Please refer to infusion clinic.

## 2020-06-23 ENCOUNTER — Telehealth: Payer: Self-pay | Admitting: Family Medicine

## 2020-06-23 NOTE — Telephone Encounter (Signed)
Pt was told Dr.Pickard will have a covid ? Infusion sent to [facility] not at [facility]

## 2020-06-24 NOTE — Telephone Encounter (Signed)
Received call from patient.   States that she would like to be referred to [facility] in [place].   States that she is >7 days past Sx onset. Inquired as to if infusion would be effective at this time. Reports that she is concerned due to immunocompromised status.  Call placed to [facility]. Form to be faxed to office for completion.

## 2020-06-24 NOTE — Telephone Encounter (Signed)
We are unable to schedule infusion. Patient must be triaged through infusion clinic.    We cannot order infusion at another hospital. She would have to call to set that up.   Call placed to patient. [place].

## 2020-07-02 ENCOUNTER — Encounter: Payer: Self-pay | Admitting: Family Medicine

## 2020-07-02 ENCOUNTER — Other Ambulatory Visit: Payer: Self-pay | Admitting: Family Medicine

## 2020-07-02 MED ORDER — SULFAMETHOXAZOLE-TRIMETHOPRIM 800-160 MG PO TABS: 1.0000 | ORAL_TABLET | Freq: Two times a day (BID) | ORAL | 0 refills | Status: DC

## 2020-07-13 ENCOUNTER — Other Ambulatory Visit: Payer: Self-pay | Admitting: *Deleted

## 2020-07-13 MED ORDER — ESTRADIOL 0.75 MG/1.25 GM (0.06%) TD GEL: 1.2500 g | Freq: Every day | TRANSDERMAL | 0 refills | Status: DC

## 2020-07-15 DIAGNOSIS — Z1231 Encounter for screening mammogram for malignant neoplasm of breast: Secondary | ICD-10-CM | POA: Diagnosis not present

## 2020-07-21 NOTE — Progress Notes (Signed)
07/15/2020 No mammographic sign of malignancy. F/u in 1 yr 07/16/21

## 2020-07-31 ENCOUNTER — Ambulatory Visit (INDEPENDENT_AMBULATORY_CARE_PROVIDER_SITE_OTHER): Payer: Medicare Other | Admitting: Family Medicine

## 2020-07-31 DIAGNOSIS — S060X0A Concussion without loss of consciousness, initial encounter: Secondary | ICD-10-CM | POA: Diagnosis not present

## 2020-07-31 NOTE — Progress Notes (Signed)
Subjective:    Patient ID: Annette Barker, female    DOB: June 11, 1945, 75 y.o.   MRN: [id number]  HPI  Patient is being seen today as a telephone visit.  She consents to be seen via telephone.  The patient is currently in [place].  I am currently at my office.  Phone call began at 928.  Phone call concluded at 940.  Wednesday, the patient was walking into her home when she struck her right forehead against a cabinet just above her right eyebrow.  She sustained a 1 inch laceration that she was able to closed with superglue.  She did not experience loss of consciousness.  However she was stunned momentarily.  She also has some mild amnesia to the event.  She states she is not exactly sure what she hit her head on.  She is certain that she did not suffer a loss of consciousness because she did not fall to the ground.  She was just dazed for several minutes afterwards.  She states that she felt fine the rest of that day.  However starting yesterday and on and today she reports a dull headache.  The headache is above her right eye.  She equates it to the pain that one experiences with a sinus infection.  She denies any dizziness.  However she does report nausea and light sensitivity.  The headache also is worse if she leans over.  She denies any bruises or swelling anywhere on the body other than around the 1 inch laceration above her right eyebrow.  Specifically she denies any large hematoma.  She denies any clear rhinorrhea or otorrhea.  She denies any neurologic deficits.  She denies any slurred speech, facial droop, double vision.  She does report feeling just a little off however when it comes to her balance Past Medical History:  Diagnosis Date  . Anxiety   . Arthritis    in her hands   . Elevated cholesterol   . Hair loss   . Hemorrhoids   . Hyperlipidemia   . Hypothyroidism    on synthroid  . IBS (irritable bowel syndrome)   . Lyme disease   . Osteoporosis 03/2017   T score -3.2   . Thyroid disease    hypothyroid   Past Surgical History:  Procedure Laterality Date  . LAPAROSCOPIC SALPINGO OOPHERECTOMY Bilateral 10/29/2014   Procedure: LAPAROSCOPIC SALPINGO OOPHORECTOMY;  Surgeon: [name], MD;  Location: [facility];  Service: Gynecology;  Laterality: Bilateral;  1:00pm OR time requested  1 1/2 hours OR time requested.  Marland Kitchen SHOULDER SURGERY    . TUBAL LIGATION     Current Outpatient Medications on File Prior to Visit  Medication Sig Dispense Refill  . diazepam (VALIUM) 5 MG tablet TAKE 1 TABLET BY MOUTH EVERYDAY AT BEDTIME 30 tablet 0  . diphenhydrAMINE HCl (BENADRYL PO) Take by mouth.    . Estradiol 0.75 MG/1.25 GM (0.06%) topical gel Place 1.25 g onto the skin daily. 50 g 0  . levothyroxine (SYNTHROID) 75 MCG tablet TAKE 1 TABLET 7MCG BY MOUTH DAILY BEFORE BREAKFAST 90 tablet 2  . predniSONE (DELTASONE) 1 MG tablet 1 mg.     . progesterone (PROMETRIUM) 100 MG capsule Take 1 capsule (100 mg total) by mouth at bedtime. 90 capsule 0  . sulfamethoxazole-trimethoprim (BACTRIM DS) 800-160 MG tablet Take 1 tablet by mouth 2 (two) times daily. 10 tablet 0  . triamcinolone cream (KENALOG) 0.1 % Apply 1 application topically 2 (two)  times daily. 30 g 0   No current facility-administered medications on file prior to visit.   Allergies  Allergen Reactions  . Hydromet [Hydrocodone-Homatropine] Itching and Swelling  . Statins Other (See Comments)    Body cramps all over body   Social History   Socioeconomic History  . Marital status: Widowed    Spouse name: Not on file  . Number of children: Not on file  . Years of education: Not on file  . Highest education level: Not on file  Occupational History  . Not on file  Tobacco Use  . Smoking status: Former Smoker    Types: Cigarettes    Quit date: 05/27/1991    Years since quitting: 29.2  . Smokeless tobacco: Never Used  Vaping Use  . Vaping Use: Never used  Substance and Sexual Activity  . Alcohol use: Yes     Alcohol/week: 0.0 standard drinks    Comment: rare  . Drug use: No  . Sexual activity: Not Currently    Birth control/protection: Post-menopausal    Comment: 1st intercourse 75 yo--Fewer than 5 partners  Other Topics Concern  . Not on file  Social History Narrative  . Not on file   Social Determinants of Health   Financial Resource Strain: Not on file  Food Insecurity: Not on file  Transportation Needs: Not on file  Physical Activity: Not on file  Stress: Not on file  Social Connections: Not on file  Intimate Partner Violence: Not on file     Review of Systems  HENT: Negative for ear discharge, facial swelling, hearing loss, nosebleeds and rhinorrhea.   Neurological: Positive for light-headedness and headaches. Negative for dizziness, tremors, seizures, syncope, facial asymmetry, speech difficulty, weakness and numbness.  All other systems reviewed and are negative.      Objective:   Physical Exam        Assessment & Plan:  Concussion without loss of consciousness, initial encounter  Certainly sounds like the patient suffered a concussion.  I believe the majority of her symptoms are related to the concussion including a dull headache, some mild disequilibrium which is what I believe she means when she states that her vision is slightly off, as well as nausea and photosensitivity.  My biggest concern would be any type of intracranial bleed however I believe the likelihood of that is extremely low given the fact that she is 48 hours out from the injury and is still answering questions appropriately and her neurologic exam is grossly intact based on her description.  Therefore, if she did have any type of intracranial bleed or subdural hematoma is most likely small and would not require any surgical intervention.  After discussing the situation and my concerns with the patient, she does not feel that she needs to go to the emergency room or an urgent care right now.  However  she will stay with someone and if her situation deteriorates she will seek medical attention.  Instead I will treat the patient as a concussion with ibuprofen 800 mg every 8 hours.  I recommended that she take it relatively easily and only engage in light activity such as walking.  Gradually advance activity as her body will tolerate.  Seek medical attention immediately if her symptoms worsen

## 2020-08-01 DIAGNOSIS — M50322 Other cervical disc degeneration at C5-C6 level: Secondary | ICD-10-CM | POA: Diagnosis not present

## 2020-08-01 DIAGNOSIS — M5030 Other cervical disc degeneration, unspecified cervical region: Secondary | ICD-10-CM | POA: Diagnosis not present

## 2020-08-01 DIAGNOSIS — S0990XA Unspecified injury of head, initial encounter: Secondary | ICD-10-CM | POA: Diagnosis not present

## 2020-08-01 DIAGNOSIS — M542 Cervicalgia: Secondary | ICD-10-CM | POA: Diagnosis not present

## 2020-08-01 DIAGNOSIS — S0993XA Unspecified injury of face, initial encounter: Secondary | ICD-10-CM | POA: Diagnosis not present

## 2020-08-01 DIAGNOSIS — S0083XA Contusion of other part of head, initial encounter: Secondary | ICD-10-CM | POA: Diagnosis not present

## 2020-08-05 ENCOUNTER — Encounter: Payer: Self-pay | Admitting: *Deleted

## 2020-08-14 MED ORDER — CHOLESTYRAMINE 4 GM/DOSE PO POWD: 2.0000 g | Freq: Two times a day (BID) | ORAL | 12 refills | Status: DC

## 2020-09-04 ENCOUNTER — Encounter: Payer: Self-pay | Admitting: Family Medicine

## 2020-09-07 MED ORDER — DIAZEPAM 5 MG PO TABS: ORAL_TABLET | ORAL | 0 refills | Status: DC

## 2020-09-07 NOTE — Telephone Encounter (Signed)
Ok to refill??  Last office visit 07/31/2020.  Last refill 05/11/2020.

## 2020-10-23 ENCOUNTER — Ambulatory Visit: Payer: Medicare Other

## 2020-10-28 NOTE — Progress Notes (Addendum)
Subjective:   Annette Barker is a 75 y.o. female who presents for Medicare Annual (Subsequent) preventive examination.  I connected with Phynix Horton today by telephone and verified that I am speaking with the correct person using two identifiers. Location patient: home Location provider: work Persons participating in the virtual visit: patient, provider.   I discussed the limitations, risks, security and privacy concerns of performing an evaluation and management service by telephone and the availability of in person appointments. I also discussed with the patient that there may be a patient responsible charge related to this service. The patient expressed understanding and verbally consented to this telephonic visit.    Interactive audio and video telecommunications were attempted between this provider and patient, however failed, due to patient having technical difficulties OR patient did not have access to video capability.  We continued and completed visit with audio only.      Review of Systems    N/A  Cardiac Risk Factors include: advanced age (>64men, >60 women);dyslipidemia     Objective:    Today's Vitals   10/29/20 0904  PainSc: 7    There is no height or weight on file to calculate BMI.  Advanced Directives 10/29/2020 10/21/2014  Does Patient Have a Medical Advance Directive? Yes Yes  Type of Paramedic of [place];Living will [place]  Does patient want to make changes to medical advance directive? No - Patient declined -  Copy of [place] in Chart? Yes - validated most recent copy scanned in chart (See row information) Yes    Current Medications (verified) Outpatient Encounter Medications as of 10/29/2020  Medication Sig   diazepam (VALIUM) 5 MG tablet TAKE 1 TABLET BY MOUTH EVERYDAY AT BEDTIME   Estradiol 0.75 MG/1.25 GM (0.06%) topical gel Place 1.25 g onto the skin daily.   levothyroxine  (SYNTHROID) 75 MCG tablet TAKE 1 TABLET 7MCG BY MOUTH DAILY BEFORE BREAKFAST   predniSONE (DELTASONE) 1 MG tablet 1 mg.    progesterone (PROMETRIUM) 100 MG capsule Take 1 capsule (100 mg total) by mouth at bedtime.   triamcinolone cream (KENALOG) 0.1 % Apply 1 application topically 2 (two) times daily.   cholestyramine (QUESTRAN) 4 GM/DOSE powder Take 0.5 packets (2 g total) by mouth 2 (two) times daily with a meal. (Patient not taking: Reported on 10/29/2020)   diphenhydrAMINE HCl (BENADRYL PO) Take by mouth. (Patient not taking: Reported on 10/29/2020)   [DISCONTINUED] sulfamethoxazole-trimethoprim (BACTRIM DS) 800-160 MG tablet Take 1 tablet by mouth 2 (two) times daily.   No facility-administered encounter medications on file as of 10/29/2020.    Allergies (verified) Hydromet [hydrocodone bit-homatrop mbr] and Statins   History: Past Medical History:  Diagnosis Date   Anxiety    Arthritis    in her hands    Elevated cholesterol    Hair loss    Hemorrhoids    Hyperlipidemia    Hypothyroidism    on synthroid   IBS (irritable bowel syndrome)    Lyme disease    Osteoporosis 03/2017   T score -3.2   Thyroid disease    hypothyroid   Past Surgical History:  Procedure Laterality Date   LAPAROSCOPIC SALPINGO OOPHERECTOMY Bilateral 10/29/2014   Procedure: LAPAROSCOPIC SALPINGO OOPHORECTOMY;  Surgeon: [name], MD;  Location: [facility];  Service: Gynecology;  Laterality: Bilateral;  1:00pm OR time requested  1 1/2 hours OR time requested.   SHOULDER SURGERY     TUBAL LIGATION  Family History  Problem Relation Age of Onset   Alzheimer's disease Mother    Arthritis Mother    Osteoporosis Father    Cancer Father        leukemia   Arthritis Father    Hyperlipidemia Father    Cancer Sister        PERITONEAL CANCER   Arthritis Sister    Heart disease Sister    Arthritis Sister    Alcohol abuse Sister    Colitis Son    Anorexia nervosa Grandchild    Social History    Socioeconomic History   Marital status: Widowed    Spouse name: Not on file   Number of children: Not on file   Years of education: Not on file   Highest education level: Not on file  Occupational History   Not on file  Tobacco Use   Smoking status: Former    Pack years: 0.00    Types: Cigarettes    Quit date: 05/27/1991    Years since quitting: 29.4   Smokeless tobacco: Never  Vaping Use   Vaping Use: Never used  Substance and Sexual Activity   Alcohol use: Yes    Alcohol/week: 0.0 standard drinks    Comment: rare   Drug use: No   Sexual activity: Not Currently    Birth control/protection: Post-menopausal    Comment: 1st intercourse 75 yo--Fewer than 5 partners  Other Topics Concern   Not on file  Social History Narrative   Not on file   Social Determinants of Health   Financial Resource Strain: Low Risk    Difficulty of Paying Living Expenses: Not hard at all  Food Insecurity: No Food Insecurity   Worried About Charity fundraiser in the Last Year: Never true   [place] in the Last Year: Never true  Transportation Needs: No Transportation Needs   Lack of Transportation (Medical): No   Lack of Transportation (Non-Medical): No  Physical Activity: Sufficiently Active   Days of Exercise per Week: 7 days   Minutes of Exercise per Session: 30 min  Stress: No Stress Concern Present   Feeling of Stress : Not at all  Social Connections: Moderately Isolated   Frequency of Communication with Friends and Family: More than three times a week   Frequency of Social Gatherings with Friends and Family: More than three times a week   Attends Religious Services: Never   Marine scientist or Organizations: No   Attends Music therapist: Never   Marital Status: Living with partner    Tobacco Counseling Counseling given: Not Answered   Clinical Intake:  Pre-visit preparation completed: Yes  Pain : 0-10 Pain Score: 7  Pain Type: Acute pain Pain  Location: Shoulder Pain Orientation: Right Pain Descriptors / Indicators: Dull Pain Onset: In the past 7 days Pain Frequency: Constant Effect of Pain on Daily Activities: affects range of motion in arm     Nutritional Risks: Nausea/ vomitting/ diarrhea (diarrhea due to IBS) Diabetes: No  How often do you need to have someone help you when you read instructions, pamphlets, or other written materials from your doctor or pharmacy?: 1 - Never  Diabetic?No  Interpreter Needed?: No  Information entered by :: [place] of Daily Living In your present state of health, do you have any difficulty performing the following activities: 10/29/2020  Hearing? N  Vision? N  Difficulty concentrating or making decisions? N  Walking or climbing stairs?  N  Dressing or bathing? N  Doing errands, shopping? N  Preparing Food and eating ? N  Using the Toilet? N  In the past six months, have you accidently leaked urine? N  Do you have problems with loss of bowel control? Y  Managing your Medications? N  Managing your Finances? N  Housekeeping or managing your Housekeeping? N  Some recent data might be hidden    Patient Care Team: Susy Frizzle, MD as PCP - General (Family Medicine)  Indicate any recent Medical Services you may have received from other than [facility] providers in the past year (date may be approximate).     Assessment:   This is a routine wellness examination for Annette Barker.  Hearing/Vision screen Vision Screening - Comments:: Patient stated that she gets eyes examined once per year. Wears reading glasses   Dietary issues and exercise activities discussed: Current Exercise Habits: Home exercise routine, Type of exercise: walking, Time (Minutes): 30, Frequency (Times/Week): 7, Weekly Exercise (Minutes/Week): 210, Intensity: Moderate, Exercise limited by: None identified   Goals Addressed             This Visit's Progress    Patient Stated       I would like  to have my IBS controlled      Prevent falls         Depression Screen PHQ 2/9 Scores 10/29/2020 01/15/2019 10/26/2017 10/13/2015  PHQ - 2 Score 0 0 0 0    Fall Risk Fall Risk  10/29/2020 04/17/2019 12/24/2018 10/26/2017 04/27/2017  Falls in the past year? 1 1 (No Data) No No  Comment - Emmi Telephone Survey: data to providers prior to load Emmi Telephone Survey: data to providers prior to load - Emmi Telephone Survey: data to providers prior to load  Number falls in past yr: 0 1 (No Data) - -  Comment - Emmi Telephone Survey Actual Response = 1 Emmi Telephone Survey Actual Response =  - -  Injury with Fall? 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 Yes  If so, are there any without handrails? No  Home free of loose throw rugs in walkways, pet beds, electrical cords, etc? Yes  Adequate lighting in your home to reduce risk of falls? Yes   ASSISTIVE DEVICES UTILIZED TO PREVENT FALLS:  Life alert? No  Use of a cane, walker or w/c? No  Grab bars in the bathroom? No  Shower chair or bench in shower? No  Elevated toilet seat or a handicapped toilet? No     Cognitive Function:  Normal cognitive status assessed by direct observation by this Nurse Health Advisor. No abnormalities found.        Immunizations Immunization History  Administered Date(s) Administered   Fluad Quad(high Dose 65+) 01/15/2019, 03/19/2020   Influenza,inj,Quad PF,6+ Mos 02/10/2016, 03/17/2017, 04/18/2018   PFIZER(Purple Top)SARS-COV-2 Vaccination 06/30/2019, 07/24/2019   Pneumococcal Conjugate-13 01/15/2019   Tdap 05/23/2008    TDAP status: Due, Education has been provided regarding the importance of this vaccine. Advised may receive this vaccine at local pharmacy or Health Dept. Aware to provide  a copy of the vaccination record if obtained from local pharmacy or Health Dept. Verbalized acceptance  and understanding.  Flu Vaccine status: Up to date  Pneumococcal vaccine status: Due, Education has been provided regarding the importance of this vaccine. Advised may receive this vaccine at local pharmacy or Health Dept. Aware to provide a copy of the vaccination record if obtained from local pharmacy or Health Dept. Verbalized acceptance and understanding.  Covid-19 vaccine status: Completed vaccines  Qualifies for Shingles Vaccine? Yes   Zostavax completed No   Shingrix Completed?: No.    Education has been provided regarding the importance of this vaccine. Patient has been advised to call insurance company to determine out of pocket expense if they have not yet received this vaccine. Advised may also receive vaccine at local pharmacy or Health Dept. Verbalized acceptance and understanding.  Screening Tests Health Maintenance  Topic Date Due   Hepatitis C Screening  Never done   Zoster Vaccines- Shingrix (1 of 2) Never done   PAP SMEAR-Modifier  06/11/2015   TETANUS/TDAP  12/16/2019   COVID-19 Vaccine (3 - Booster for Pfizer series) 12/24/2019   PNA vac Low Risk Adult (2 of 2 - PPSV23) 01/15/2020   INFLUENZA VACCINE  12/21/2020   MAMMOGRAM  07/15/2021   COLONOSCOPY (Pts 45-96yrs Insurance coverage will need to be confirmed)  02/21/2026   DEXA SCAN  Completed   Pneumococcal Vaccine 80-70 Years old  Aged Out   HPV [place] Maintenance Due  Topic Date Due   Hepatitis C Screening  Never done   Zoster Vaccines- Shingrix (1 of 2) Never done   PAP SMEAR-Modifier  06/11/2015   TETANUS/TDAP  12/16/2019   COVID-19 Vaccine (3 - Booster for Pfizer series) 12/24/2019   PNA vac Low Risk Adult (2 of 2 - PPSV23) 01/15/2020     Colorectal cancer screening: Type of screening: Colonoscopy. Completed 03/10/2016. Repeat every 10 years  Mammogram  status: Completed 07/15/2020. Repeat every year  Bone Density status: Completed 06/28/2019. Results reflect: Bone density results: OSTEOPOROSIS. Repeat every 2 years.  Lung Cancer Screening: (Low Dose CT Chest recommended if Age 35-80 years, 30 pack-year currently smoking OR have quit w/in 15years.) does not qualify.   Lung Cancer Screening Referral: N/A  Additional Screening:  Hepatitis C Screening: does qualify;   Vision Screening: Recommended annual ophthalmology exams for early detection of glaucoma and other disorders of the eye. Is the patient up to date with their annual eye exam?  Yes  Who is the provider or what is the name of the office in which the patient attends annual eye exams? Patient unsure of eye doctors name If pt is not established with a provider, would they like to be referred to a provider to establish care? No .   Dental Screening: Recommended annual dental exams for proper oral hygiene  Community Resource Referral / Chronic Care Management: CRR required this visit?  No   CCM required this visit?  No      Plan:     I have personally reviewed and noted the following in the patient's chart:   Medical and social history Use of alcohol, tobacco or illicit drugs  Current medications and supplements including opioid prescriptions.  Functional ability and status Nutritional status Physical activity Advanced directives List of other physicians Hospitalizations, surgeries, and ER visits in previous 12 months Vitals Screenings to include cognitive, depression, and falls Referrals and appointments  In addition, I have reviewed and discussed with patient certain preventive protocols, quality metrics, and best practice recommendations. A  written personalized care plan for preventive services as well as general preventive health recommendations were provided to patient.     Ofilia Neas, LPN   11/22/9019   Nurse Notes: Patient has concerns of IBS and would  like to be referred to a Gastroenterologist

## 2020-10-29 ENCOUNTER — Other Ambulatory Visit: Payer: Self-pay

## 2020-10-29 ENCOUNTER — Telehealth: Payer: Self-pay | Admitting: Family Medicine

## 2020-10-29 ENCOUNTER — Ambulatory Visit (INDEPENDENT_AMBULATORY_CARE_PROVIDER_SITE_OTHER): Payer: Medicare Other

## 2020-10-29 DIAGNOSIS — Z0000 Encounter for general adult medical examination without abnormal findings: Secondary | ICD-10-CM | POA: Diagnosis not present

## 2020-10-29 DIAGNOSIS — K580 Irritable bowel syndrome with diarrhea: Secondary | ICD-10-CM

## 2020-10-29 NOTE — Telephone Encounter (Signed)
Patient would like to know if we could refer her to a gastroenterologist for her IBS. Please advise?

## 2020-10-29 NOTE — Telephone Encounter (Signed)
Patient would like a referral to be placed states she has seen PCP for issue in the past. Referral order placed

## 2020-10-29 NOTE — Patient Instructions (Signed)
Annette Barker , Thank you for taking time to come for your Medicare Wellness Visit. I appreciate your ongoing commitment to your health goals. Please review the following plan we discussed and let me know if I can assist you in the future.   Screening recommendations/referrals: Colonoscopy: Up to date, next 03/10/2026 Mammogram: Up to date next due 07/15/2021 Bone Density: Up to date, next due 06/27/2021 Recommended yearly ophthalmology/optometry visit for glaucoma screening and checkup Recommended yearly dental visit for hygiene and checkup  Vaccinations: Influenza vaccine: Up to date, next due fall 2022 Pneumococcal vaccine: Currently due, you may receive at your next in person office visit Tdap vaccine: Currently due, you may await injury to receive  Shingles vaccine: Currently due, we recommend that you receive at your local pharmacy    Advanced directives: None   Conditions/risks identified: None   Next appointment: None    Preventive Care 65 Years and Older, Female Preventive care refers to lifestyle choices and visits with your health care provider that can promote health and wellness. What does preventive care include? A yearly physical exam. This is also called an annual well check. Dental exams once or twice a year. Routine eye exams. Ask your health care provider how often you should have your eyes checked. Personal lifestyle choices, including: Daily care of your teeth and gums. Regular physical activity. Eating a healthy diet. Avoiding tobacco and drug use. Limiting alcohol use. Practicing safe sex. Taking low-dose aspirin every day. Taking vitamin and mineral supplements as recommended by your health care provider. What happens during an annual well check? The services and screenings done by your health care provider during your annual well check will depend on your age, overall health, lifestyle risk factors, and family history of disease. Counseling  Your health  care provider may ask you questions about your: Alcohol use. Tobacco use. Drug use. Emotional well-being. Home and relationship well-being. Sexual activity. Eating habits. History of falls. Memory and ability to understand (cognition). Work and work Statistician. Reproductive health. Screening  You may have the following tests or measurements: Height, weight, and BMI. Blood pressure. Lipid and cholesterol levels. These may be checked every 5 years, or more frequently if you are over 64 years old. Skin check. Lung cancer screening. You may have this screening every year starting at age 36 if you have a 30-pack-year history of smoking and currently smoke or have quit within the past 15 years. Fecal occult blood test (FOBT) of the stool. You may have this test every year starting at age 66. Flexible sigmoidoscopy or colonoscopy. You may have a sigmoidoscopy every 5 years or a colonoscopy every 10 years starting at age 44. Hepatitis C blood test. Hepatitis B blood test. Sexually transmitted disease (STD) testing. Diabetes screening. This is done by checking your blood sugar (glucose) after you have not eaten for a while (fasting). You may have this done every 1-3 years. Bone density scan. This is done to screen for osteoporosis. You may have this done starting at age 10. Mammogram. This may be done every 1-2 years. Talk to your health care provider about how often you should have regular mammograms. Talk with your health care provider about your test results, treatment options, and if necessary, the need for more tests. Vaccines  Your health care provider may recommend certain vaccines, such as: Influenza vaccine. This is recommended every year. Tetanus, diphtheria, and acellular pertussis (Tdap, Td) vaccine. You may need a Td booster every 10 years. Zoster vaccine. You  may need this after age 66. Pneumococcal 13-valent conjugate (PCV13) vaccine. One dose is recommended after age  86. Pneumococcal polysaccharide (PPSV23) vaccine. One dose is recommended after age 86. Talk to your health care provider about which screenings and vaccines you need and how often you need them. This information is not intended to replace advice given to you by your health care provider. Make sure you discuss any questions you have with your health care provider. Document Released: 06/05/2015 Document Revised: 01/27/2016 Document Reviewed: 03/10/2015 Elsevier Interactive Patient Education  2017 [place] Prevention in the Home Falls can cause injuries. They can happen to people of all ages. There are many things you can do to make your home safe and to help prevent falls. What can I do on the outside of my home? Regularly fix the edges of walkways and driveways and fix any cracks. Remove anything that might make you trip as you walk through a door, such as a raised step or threshold. Trim any bushes or trees on the path to your home. Use bright outdoor lighting. Clear any walking paths of anything that might make someone trip, such as rocks or tools. Regularly check to see if handrails are loose or broken. Make sure that both sides of any steps have handrails. Any raised decks and porches should have guardrails on the edges. Have any leaves, snow, or ice cleared regularly. Use sand or salt on walking paths during winter. Clean up any spills in your garage right away. This includes oil or grease spills. What can I do in the bathroom? Use night lights. Install grab bars by the toilet and in the tub and shower. Do not use towel bars as grab bars. Use non-skid mats or decals in the tub or shower. If you need to sit down in the shower, use a plastic, non-slip stool. Keep the floor dry. Clean up any water that spills on the floor as soon as it happens. Remove soap buildup in the tub or shower regularly. Attach bath mats securely with double-sided non-slip rug tape. Do not have throw  rugs and other things on the floor that can make you trip. What can I do in the bedroom? Use night lights. Make sure that you have a light by your bed that is easy to reach. Do not use any sheets or blankets that are too big for your bed. They should not hang down onto the floor. Have a firm chair that has side arms. You can use this for support while you get dressed. Do not have throw rugs and other things on the floor that can make you trip. What can I do in the kitchen? Clean up any spills right away. Avoid walking on wet floors. Keep items that you use a lot in easy-to-reach places. If you need to reach something above you, use a strong step stool that has a grab bar. Keep electrical cords out of the way. Do not use floor polish or wax that makes floors slippery. If you must use wax, use non-skid floor wax. Do not have throw rugs and other things on the floor that can make you trip. What can I do with my stairs? Do not leave any items on the stairs. Make sure that there are handrails on both sides of the stairs and use them. Fix handrails that are broken or loose. Make sure that handrails are as long as the stairways. Check any carpeting to make sure that it is firmly  attached to the stairs. Fix any carpet that is loose or worn. Avoid having throw rugs at the top or bottom of the stairs. If you do have throw rugs, attach them to the floor with carpet tape. Make sure that you have a light switch at the top of the stairs and the bottom of the stairs. If you do not have them, ask someone to add them for you. What else can I do to help prevent falls? Wear shoes that: Do not have high heels. Have rubber bottoms. Are comfortable and fit you well. Are closed at the toe. Do not wear sandals. If you use a stepladder: Make sure that it is fully opened. Do not climb a closed stepladder. Make sure that both sides of the stepladder are locked into place. Ask someone to hold it for you, if  possible. Clearly mark and make sure that you can see: Any grab bars or handrails. First and last steps. Where the edge of each step is. Use tools that help you move around (mobility aids) if they are needed. These include: Canes. Walkers. Scooters. Crutches. Turn on the lights when you go into a dark area. Replace any light bulbs as soon as they burn out. Set up your furniture so you have a clear path. Avoid moving your furniture around. If any of your floors are uneven, fix them. If there are any pets around you, be aware of where they are. Review your medicines with your doctor. Some medicines can make you feel dizzy. This can increase your chance of falling. Ask your doctor what other things that you can do to help prevent falls. This information is not intended to replace advice given to you by your health care provider. Make sure you discuss any questions you have with your health care provider. Document Released: 03/05/2009 Document Revised: 10/15/2015 Document Reviewed: 06/13/2014 Elsevier Interactive Patient Education  2017 [employer].

## 2020-11-07 ENCOUNTER — Encounter: Payer: Self-pay | Admitting: Family Medicine

## 2020-11-20 ENCOUNTER — Other Ambulatory Visit: Payer: Self-pay

## 2020-11-20 MED ORDER — DIAZEPAM 5 MG PO TABS: ORAL_TABLET | ORAL | 0 refills | Status: DC

## 2020-11-24 ENCOUNTER — Other Ambulatory Visit: Payer: Self-pay | Admitting: Family Medicine

## 2020-11-24 MED ORDER — DIAZEPAM 5 MG PO TABS: ORAL_TABLET | ORAL | 0 refills | Status: DC

## 2020-11-24 MED ORDER — CHOLESTYRAMINE 4 GM/DOSE PO POWD: 2.0000 g | Freq: Two times a day (BID) | ORAL | 12 refills | Status: DC

## 2020-11-25 ENCOUNTER — Encounter: Payer: Self-pay | Admitting: Family Medicine

## 2020-11-27 ENCOUNTER — Encounter: Payer: Self-pay | Admitting: Family Medicine

## 2020-12-14 ENCOUNTER — Encounter: Payer: Self-pay | Admitting: Gastroenterology

## 2020-12-14 ENCOUNTER — Encounter: Payer: Self-pay | Admitting: Family Medicine

## 2020-12-15 ENCOUNTER — Other Ambulatory Visit: Payer: Self-pay

## 2020-12-15 ENCOUNTER — Ambulatory Visit (INDEPENDENT_AMBULATORY_CARE_PROVIDER_SITE_OTHER): Payer: Medicare Other | Admitting: Family Medicine

## 2020-12-15 ENCOUNTER — Encounter: Payer: Self-pay | Admitting: Family Medicine

## 2020-12-15 VITALS — BP 122/70 | HR 62 | Temp 98.1°F | Ht 64.0 in | Wt 121.0 lb

## 2020-12-15 DIAGNOSIS — R1030 Lower abdominal pain, unspecified: Secondary | ICD-10-CM

## 2020-12-15 LAB — URINALYSIS, ROUTINE W REFLEX MICROSCOPIC
Bacteria, UA: NONE SEEN /HPF
Bilirubin Urine: NEGATIVE
Glucose, UA: NEGATIVE
Hyaline Cast: NONE SEEN /LPF
Ketones, ur: NEGATIVE
Nitrite: NEGATIVE
Protein, ur: NEGATIVE
Specific Gravity, Urine: 1.002 (ref 1.001–1.035)
pH: 6.5 (ref 5.0–8.0)

## 2020-12-15 LAB — MICROSCOPIC MESSAGE

## 2020-12-15 MED ORDER — DICYCLOMINE HCL 20 MG PO TABS: 20.0000 mg | ORAL_TABLET | Freq: Four times a day (QID) | ORAL | 0 refills | Status: DC

## 2020-12-15 MED ORDER — METRONIDAZOLE 500 MG PO TABS: 500.0000 mg | ORAL_TABLET | Freq: Two times a day (BID) | ORAL | 0 refills | Status: AC

## 2020-12-15 MED ORDER — CIPROFLOXACIN HCL 500 MG PO TABS: 500.0000 mg | ORAL_TABLET | Freq: Two times a day (BID) | ORAL | 0 refills | Status: AC

## 2020-12-15 NOTE — Progress Notes (Signed)
Subjective:    Patient ID: Annette Barker, female    DOB: 25-May-1945, 75 y.o.   MRN: [id number]  HPI Patient is a very pleasant 75 year old Caucasian female with a history of IBS.  She also has a history of diverticulosis.  She states that few days ago she developed severe left-sided lower abdominal pain.  She went to an urgent care but states that no testing was done.  Urinalysis here shows trace leukocyte Estrace and trace blood but is otherwise unremarkable.  She denies any fevers or chills.  She is mildly tender to palpation in the left lower quadrant.  There is no guarding.  There is no rebound.  She has normal bowel sounds.  She has chronic diarrhea related to IBS.  I gave her cholestyramine for the chronic diarrhea.  She is concerned that the cholestyramine may have been causing the abdominal pain.  She also has a son who has a history of ulcerative colitis Past Medical History:  Diagnosis Date   Anxiety    Arthritis    in her hands    Elevated cholesterol    Hair loss    Hemorrhoids    Hyperlipidemia    Hypothyroidism    on synthroid   IBS (irritable bowel syndrome)    Lyme disease    Osteoporosis 03/2017   T score -3.2   Thyroid disease    hypothyroid   Past Surgical History:  Procedure Laterality Date   LAPAROSCOPIC SALPINGO OOPHERECTOMY Bilateral 10/29/2014   Procedure: LAPAROSCOPIC SALPINGO OOPHORECTOMY;  Surgeon: [name], MD;  Location: [facility];  Service: Gynecology;  Laterality: Bilateral;  1:00pm OR time requested  1 1/2 hours OR time requested.   SHOULDER SURGERY     TUBAL LIGATION     Current Outpatient Medications on File Prior to Visit  Medication Sig Dispense Refill   cholestyramine (QUESTRAN) 4 GM/DOSE powder Take 0.5 packets (2 g total) by mouth 2 (two) times daily with a meal. 378 g 12   diazepam (VALIUM) 5 MG tablet TAKE 1 TABLET BY MOUTH EVERYDAY AT BEDTIME 30 tablet 0   diphenhydrAMINE HCl (BENADRYL PO) Take by mouth.     Estradiol 0.75  MG/1.25 GM (0.06%) topical gel Place 1.25 g onto the skin daily. 50 g 0   levothyroxine (SYNTHROID) 75 MCG tablet TAKE 1 TABLET 7MCG BY MOUTH DAILY BEFORE BREAKFAST 90 tablet 2   predniSONE (DELTASONE) 1 MG tablet 1 mg.      progesterone (PROMETRIUM) 100 MG capsule Take 1 capsule (100 mg total) by mouth at bedtime. 90 capsule 0   triamcinolone cream (KENALOG) 0.1 % Apply 1 application topically 2 (two) times daily. 30 g 0   No current facility-administered medications on file prior to visit.   Allergies  Allergen Reactions   Hydromet [Hydrocodone Bit-Homatrop Mbr] Itching and Swelling   Codeine Swelling   Statins Other (See Comments)    Body cramps all over body   Social History   Socioeconomic History   Marital status: Widowed    Spouse name: Not on file   Number of children: Not on file   Years of education: Not on file   Highest education level: Not on file  Occupational History   Not on file  Tobacco Use   Smoking status: Former    Types: Cigarettes    Quit date: 05/27/1991    Years since quitting: 29.5   Smokeless tobacco: Never  Vaping Use   Vaping Use: Never used  Substance  and Sexual Activity   Alcohol use: Yes    Alcohol/week: 0.0 standard drinks    Comment: rare   Drug use: No   Sexual activity: Not Currently    Birth control/protection: Post-menopausal    Comment: 1st intercourse 75 yo--Fewer than 5 partners  Other Topics Concern   Not on file  Social History Narrative   Not on file   Social Determinants of Health   Financial Resource Strain: Low Risk    Difficulty of Paying Living Expenses: Not hard at all  Food Insecurity: No Food Insecurity   Worried About Charity fundraiser in the Last Year: Never true   [place] in the Last Year: Never true  Transportation Needs: No Transportation Needs   Lack of Transportation (Medical): No   Lack of Transportation (Non-Medical): No  Physical Activity: Sufficiently Active   Days of Exercise per Week: 7  days   Minutes of Exercise per Session: 30 min  Stress: No Stress Concern Present   Feeling of Stress : Not at all  Social Connections: Moderately Isolated   Frequency of Communication with Friends and Family: More than three times a week   Frequency of Social Gatherings with Friends and Family: More than three times a week   Attends Religious Services: Never   Marine scientist or Organizations: No   Attends Music therapist: Never   Marital Status: Living with partner  Intimate Partner Violence: Not At Risk   Fear of Current or Ex-Partner: No   Emotionally Abused: No   Physically Abused: No   Sexually Abused: No      Review of Systems     Objective:   Physical Exam Constitutional:      General: She is not in acute distress.    Appearance: Normal appearance. She is normal weight. She is not ill-appearing or toxic-appearing.  Cardiovascular:     Rate and Rhythm: Normal rate and regular rhythm.     Pulses: Normal pulses.     Heart sounds: Normal heart sounds. No murmur heard.   No friction rub. No gallop.  Pulmonary:     Effort: Pulmonary effort is normal. No respiratory distress.     Breath sounds: Normal breath sounds. No stridor. No wheezing, rhonchi or rales.  Abdominal:     General: Abdomen is flat. Bowel sounds are normal. There is no distension.     Palpations: Abdomen is soft.     Tenderness: There is abdominal tenderness. There is no guarding or rebound.     Hernia: No hernia is present.  Neurological:     Mental Status: She is alert.          Assessment & Plan:  Lower abdominal pain - Plan: Urinalysis, Routine w reflex microscopic, Urine Culture Differential diagnosis of the left lower quadrant abdominal pain is IBS, side effects from cholestyramine, colitis either infectious or inflammatory.  I do not feel that this is gynecologic as the patient has had both of her ovaries removed.  Symptoms do not sound consistent with a urinary tract  infection as she denies dysuria.  The pain does not come and go in waves as one would expect with a kidney stone.  Therefore I do not feel that this is related to any type of urologic issue.  Therefore I believe this is most likely intestinal.  I have asked the patient to hold the cholestyramine and use Bentyl 20 mg every 6 hours as needed for pain.  If the pain intensifies, or she develops fever or bloody diarrhea, I want her to start Cipro 500 mg p.o. twice daily for 10 days and Flagyl 500 mg p.o. twice daily for 10 days for possible diverticulitis.  She already has an appointment to see a gastroenterologist for colonoscopy for screening for colon cancer.  If the pain continues and work-up is negative, would also keep inflammatory bowel disease on the differential diagnosis.  However most likely I feel that this is IBS

## 2020-12-16 DIAGNOSIS — H35371 Puckering of macula, right eye: Secondary | ICD-10-CM | POA: Diagnosis not present

## 2020-12-16 DIAGNOSIS — M353 Polymyalgia rheumatica: Secondary | ICD-10-CM | POA: Diagnosis not present

## 2020-12-16 DIAGNOSIS — H2513 Age-related nuclear cataract, bilateral: Secondary | ICD-10-CM | POA: Diagnosis not present

## 2020-12-16 DIAGNOSIS — H5319 Other subjective visual disturbances: Secondary | ICD-10-CM | POA: Diagnosis not present

## 2020-12-16 DIAGNOSIS — H353132 Nonexudative age-related macular degeneration, bilateral, intermediate dry stage: Secondary | ICD-10-CM | POA: Diagnosis not present

## 2020-12-16 DIAGNOSIS — D3131 Benign neoplasm of right choroid: Secondary | ICD-10-CM | POA: Diagnosis not present

## 2020-12-16 DIAGNOSIS — Z6821 Body mass index (BMI) 21.0-21.9, adult: Secondary | ICD-10-CM | POA: Diagnosis not present

## 2020-12-16 LAB — URINE CULTURE
MICRO NUMBER:: 12164060
SPECIMEN QUALITY:: ADEQUATE

## 2021-01-01 ENCOUNTER — Other Ambulatory Visit: Payer: Self-pay | Admitting: Family Medicine

## 2021-01-01 MED ORDER — CIPROFLOXACIN HCL 500 MG PO TABS: 500.0000 mg | ORAL_TABLET | Freq: Two times a day (BID) | ORAL | 0 refills | Status: AC

## 2021-01-07 ENCOUNTER — Other Ambulatory Visit: Payer: Self-pay | Admitting: Family Medicine

## 2021-01-07 DIAGNOSIS — H353131 Nonexudative age-related macular degeneration, bilateral, early dry stage: Secondary | ICD-10-CM | POA: Diagnosis not present

## 2021-01-07 DIAGNOSIS — D3131 Benign neoplasm of right choroid: Secondary | ICD-10-CM | POA: Diagnosis not present

## 2021-01-07 MED ORDER — ESTRADIOL 0.1 MG/GM VA CREA: 1.0000 | TOPICAL_CREAM | Freq: Every day | VAGINAL | 12 refills | Status: DC

## 2021-01-08 ENCOUNTER — Other Ambulatory Visit: Payer: Self-pay | Admitting: Family Medicine

## 2021-01-08 ENCOUNTER — Encounter: Payer: Self-pay | Admitting: Family Medicine

## 2021-01-08 MED ORDER — PREDNISONE 1 MG PO TABS: 1.5000 mg | ORAL_TABLET | Freq: Every day | ORAL | 0 refills | Status: DC

## 2021-01-15 ENCOUNTER — Other Ambulatory Visit: Payer: Self-pay | Admitting: Family Medicine

## 2021-01-26 ENCOUNTER — Ambulatory Visit: Payer: Medicare Other | Admitting: Gastroenterology

## 2021-01-27 ENCOUNTER — Encounter: Payer: Self-pay | Admitting: Family Medicine

## 2021-02-04 ENCOUNTER — Encounter: Payer: Self-pay | Admitting: Family Medicine

## 2021-02-05 ENCOUNTER — Other Ambulatory Visit: Payer: Self-pay | Admitting: Family Medicine

## 2021-02-05 DIAGNOSIS — Z23 Encounter for immunization: Secondary | ICD-10-CM | POA: Diagnosis not present

## 2021-02-12 DIAGNOSIS — M25562 Pain in left knee: Secondary | ICD-10-CM | POA: Diagnosis not present

## 2021-02-18 DIAGNOSIS — M25562 Pain in left knee: Secondary | ICD-10-CM | POA: Diagnosis not present

## 2021-02-19 DIAGNOSIS — M25562 Pain in left knee: Secondary | ICD-10-CM | POA: Diagnosis not present

## 2021-02-23 DIAGNOSIS — M25562 Pain in left knee: Secondary | ICD-10-CM | POA: Diagnosis not present

## 2021-03-03 ENCOUNTER — Other Ambulatory Visit: Payer: Self-pay | Admitting: Family Medicine

## 2021-03-04 MED ORDER — DIAZEPAM 5 MG PO TABS: ORAL_TABLET | ORAL | 0 refills | Status: DC

## 2021-03-04 NOTE — Telephone Encounter (Signed)
Patient called to request Rx for diazepam (VALIUM) 5 MG tablet [[id number]]  be canceled; [facility] [place]. Patient no longer there and is requesting Rx be resent to [facility].  Pharmacy confirmed as  [id number] [address], [place], [place] [address]  Phone:  [phone]  Fax:  [phone]  DEA #:  [id number]   Moving forward, patient requesting all prescriptions be [facility] [place].   Please advise at [phone]

## 2021-03-04 NOTE — Addendum Note (Signed)
Addended by: Sheral Flow on: 03/04/2021 11:47 AM   Modules accepted: Orders

## 2021-03-08 ENCOUNTER — Encounter: Payer: Self-pay | Admitting: Family Medicine

## 2021-03-21 ENCOUNTER — Encounter: Payer: Self-pay | Admitting: Family Medicine

## 2021-03-26 ENCOUNTER — Ambulatory Visit (INDEPENDENT_AMBULATORY_CARE_PROVIDER_SITE_OTHER): Payer: Medicare Other | Admitting: Family Medicine

## 2021-03-26 ENCOUNTER — Other Ambulatory Visit: Payer: Self-pay

## 2021-03-26 ENCOUNTER — Encounter: Payer: Self-pay | Admitting: Family Medicine

## 2021-03-26 VITALS — BP 118/66 | HR 72 | Temp 98.4°F | Resp 16 | Ht 64.0 in | Wt 124.0 lb

## 2021-03-26 DIAGNOSIS — R6889 Other general symptoms and signs: Secondary | ICD-10-CM | POA: Diagnosis not present

## 2021-03-26 DIAGNOSIS — R5383 Other fatigue: Secondary | ICD-10-CM | POA: Diagnosis not present

## 2021-03-26 DIAGNOSIS — G629 Polyneuropathy, unspecified: Secondary | ICD-10-CM | POA: Diagnosis not present

## 2021-03-26 NOTE — Progress Notes (Signed)
Subjective:    Patient ID: Annette Barker, female    DOB: Apr 20, 1946, 75 y.o.   MRN: [id number]  HPI Patient was bitten by a tick behind her right ear 1 week ago.  Today there is a 1 cm erythematous papule.  There is no spreading red ring.  There is no flulike symptoms.  I recommended that we simply monitor this.  However she reports about a month of neuropathy in her feet.  She states that it feels like she has a folded up paper towel in her shoes.  She feels like her feet are numb whenever she walks.  On examination today she has excellent 2/4 dorsalis pedis pulses bilaterally.  She has normal posterior tibialis pulses.  She has no visible deformities in the feet.  She has normal sensation to 10 g monofilament in all of her toes and in the balls of her feet bilaterally.  She has normal sensation to a tuning fork vibration.  There is no rash.  There is no tenderness on palpation Past Medical History:  Diagnosis Date   Anxiety    Arthritis    in her hands    Elevated cholesterol    Hair loss    Hemorrhoids    Hyperlipidemia    Hypothyroidism    on synthroid   IBS (irritable bowel syndrome)    Lyme disease    Osteoporosis 03/2017   T score -3.2   Thyroid disease    hypothyroid   Past Surgical History:  Procedure Laterality Date   LAPAROSCOPIC SALPINGO OOPHERECTOMY Bilateral 10/29/2014   Procedure: LAPAROSCOPIC SALPINGO OOPHORECTOMY;  Surgeon: [name], MD;  Location: [facility];  Service: Gynecology;  Laterality: Bilateral;  1:00pm OR time requested  1 1/2 hours OR time requested.   SHOULDER SURGERY     TUBAL LIGATION     Current Outpatient Medications on File Prior to Visit  Medication Sig Dispense Refill   diazepam (VALIUM) 5 MG tablet TAKE 1 TABLET BY MOUTH EVERYDAY AT BEDTIME 30 tablet 0   diphenhydrAMINE HCl (BENADRYL PO) Take by mouth.     levothyroxine (SYNTHROID) 75 MCG tablet TAKE 1 TABLET 7MCG BY MOUTH DAILY BEFORE BREAKFAST 90 tablet 1   predniSONE (DELTASONE) 1  MG tablet TAKE 1.5 TABLETS (1.5 MG TOTAL) BY MOUTH DAILY WITH BREAKFAST. (Patient taking differently: Take 1 mg by mouth daily with breakfast.) 45 tablet 1   triamcinolone cream (KENALOG) 0.1 % Apply 1 application topically 2 (two) times daily. 30 g 0   dicyclomine (BENTYL) 20 MG tablet Take 1 tablet (20 mg total) by mouth every 6 (six) hours. (Patient not taking: Reported on 03/26/2021) 30 tablet 0   estradiol (ESTRACE VAGINAL) 0.1 MG/GM vaginal cream Place 1 Applicatorful vaginally at bedtime. (Patient not taking: Reported on 03/26/2021) 42.5 g 12   Estradiol 0.75 MG/1.25 GM (0.06%) topical gel Place 1.25 g onto the skin daily. (Patient not taking: Reported on 03/26/2021) 50 g 0   No current facility-administered medications on file prior to visit.   Allergies  Allergen Reactions   Hydromet [Hydrocodone Bit-Homatrop Mbr] Itching and Swelling   Codeine Swelling   Statins Other (See Comments)    Body cramps all over body   Social History   Socioeconomic History   Marital status: Widowed    Spouse name: Not on file   Number of children: Not on file   Years of education: Not on file   Highest education level: Not on file  Occupational History  Not on file  Tobacco Use   Smoking status: Former    Types: Cigarettes    Quit date: 05/27/1991    Years since quitting: 29.8   Smokeless tobacco: Never  Vaping Use   Vaping Use: Never used  Substance and Sexual Activity   Alcohol use: Yes    Alcohol/week: 0.0 standard drinks    Comment: rare   Drug use: No   Sexual activity: Not Currently    Birth control/protection: Post-menopausal    Comment: 1st intercourse 75 yo--Fewer than 5 partners  Other Topics Concern   Not on file  Social History Narrative   Not on file   Social Determinants of Health   Financial Resource Strain: Low Risk    Difficulty of Paying Living Expenses: Not hard at all  Food Insecurity: No Food Insecurity   Worried About Charity fundraiser in the Last Year:  Never true   [place] in the Last Year: Never true  Transportation Needs: No Transportation Needs   Lack of Transportation (Medical): No   Lack of Transportation (Non-Medical): No  Physical Activity: Sufficiently Active   Days of Exercise per Week: 7 days   Minutes of Exercise per Session: 30 min  Stress: No Stress Concern Present   Feeling of Stress : Not at all  Social Connections: Moderately Isolated   Frequency of Communication with Friends and Family: More than three times a week   Frequency of Social Gatherings with Friends and Family: More than three times a week   Attends Religious Services: Never   Marine scientist or Organizations: No   Attends Music therapist: Never   Marital Status: Living with partner  Intimate Partner Violence: Not At Risk   Fear of Current or Ex-Partner: No   Emotionally Abused: No   Physically Abused: No   Sexually Abused: No      Review of Systems  All other systems reviewed and are negative.     Objective:   Physical Exam Vitals reviewed.  Constitutional:      General: She is not in acute distress.    Appearance: Normal appearance. She is not ill-appearing, toxic-appearing or diaphoretic.  HENT:     Head: Normocephalic and atraumatic.     Nose: No congestion or rhinorrhea.     Mouth/Throat:     Mouth: Mucous membranes are moist.     Pharynx: No oropharyngeal exudate or posterior oropharyngeal erythema.  Eyes:     General: No scleral icterus.       Right eye: No discharge.        Left eye: No discharge.     Extraocular Movements: Extraocular movements intact.     Conjunctiva/sclera: Conjunctivae normal.     Pupils: Pupils are equal, round, and reactive to light.  Cardiovascular:     Rate and Rhythm: Normal rate and regular rhythm.     Pulses: Normal pulses.          Dorsalis pedis pulses are 2+ on the right side and 2+ on the left side.       Posterior tibial pulses are 2+ on the right side and 2+ on the  left side.     Heart sounds: Normal heart sounds. No murmur heard.   No friction rub. No gallop.  Pulmonary:     Effort: Pulmonary effort is normal. No respiratory distress.     Breath sounds: Normal breath sounds. No stridor. No wheezing, rhonchi or rales.  Chest:  Chest wall: No tenderness.  Abdominal:     General: Bowel sounds are normal.     Palpations: Abdomen is soft.     Tenderness: There is no abdominal tenderness. There is no right CVA tenderness, left CVA tenderness, guarding or rebound.  Musculoskeletal:     Right lower leg: No edema.     Left lower leg: No edema.     Right foot: Normal range of motion. No deformity, bunion, Charcot foot or prominent metatarsal heads.     Left foot: Normal range of motion. No deformity, bunion, Charcot foot or prominent metatarsal heads.  Feet:     Right foot:     Skin integrity: Skin integrity normal.     Left foot:     Skin integrity: Skin integrity normal.  Skin:    Coloration: Skin is not jaundiced.     Findings: No erythema or rash.  Neurological:     General: No focal deficit present.     Mental Status: She is alert and oriented to person, place, and time. Mental status is at baseline.     Motor: No weakness.     Coordination: Coordination normal.          Assessment & Plan:  Neuropathy - Plan: COMPLETE METABOLIC PANEL WITH GFR, Vitamin B12, CBC with Differential/Platelet, TSH  Other fatigue - Plan: COMPLETE METABOLIC PANEL WITH GFR, Vitamin B12, CBC with Differential/Platelet, TSH  Other general symptoms and signs  - Plan: Vitamin B12, CBC with Differential/Platelet, TSH Given her symptoms, I will check CBC CMP TSH and vitamin B12 to rule out metabolic causes of neuropathy and fatigue.  I suspect peripheral neuropathy due to age and generalized wear and tear.  No evidence of a Morton's neuroma on examination today.  Await the results of the lab work.  If worsening, we can proceed with nerve conduction studies.

## 2021-03-27 LAB — COMPLETE METABOLIC PANEL WITH GFR
AG Ratio: 1.6 (calc) (ref 1.0–2.5)
ALT: 10 U/L (ref 6–29)
AST: 14 U/L (ref 10–35)
Albumin: 4.1 g/dL (ref 3.6–5.1)
Alkaline phosphatase (APISO): 69 U/L (ref 37–153)
BUN: 17 mg/dL (ref 7–25)
CO2: 25 mmol/L (ref 20–32)
Calcium: 8.9 mg/dL (ref 8.6–10.4)
Chloride: 103 mmol/L (ref 98–110)
Creat: 0.78 mg/dL (ref 0.60–1.00)
Globulin: 2.5 g/dL (calc) (ref 1.9–3.7)
Glucose, Bld: 88 mg/dL (ref 65–99)
Potassium: 4.7 mmol/L (ref 3.5–5.3)
Sodium: 137 mmol/L (ref 135–146)
Total Bilirubin: 0.3 mg/dL (ref 0.2–1.2)
Total Protein: 6.6 g/dL (ref 6.1–8.1)
eGFR: 80 mL/min/{1.73_m2} (ref >=60)

## 2021-03-27 LAB — CBC WITH DIFFERENTIAL/PLATELET
Absolute Monocytes: 577 cells/uL (ref 200–950)
Basophils Absolute: 62 cells/uL (ref 0–200)
Basophils Relative: 1 %
Eosinophils Absolute: 211 cells/uL (ref 15–500)
Eosinophils Relative: 3.4 %
HCT: 34.8 % — ABNORMAL LOW (ref 35.0–45.0)
Hemoglobin: 11.7 g/dL (ref 11.7–15.5)
Lymphs Abs: 1885 cells/uL (ref 850–3900)
MCH: 33 pg (ref 27.0–33.0)
MCHC: 33.6 g/dL (ref 32.0–36.0)
MCV: 98 fL (ref 80.0–100.0)
MPV: 10.3 fL (ref 7.5–12.5)
Monocytes Relative: 9.3 %
Neutro Abs: 3466 cells/uL (ref 1500–7800)
Neutrophils Relative %: 55.9 %
Platelets: 300 10*3/uL (ref 140–400)
RBC: 3.55 10*6/uL — ABNORMAL LOW (ref 3.80–5.10)
RDW: 12.4 % (ref 11.0–15.0)
Total Lymphocyte: 30.4 %
WBC: 6.2 10*3/uL (ref 3.8–10.8)

## 2021-03-27 LAB — VITAMIN B12: Vitamin B-12: 315 pg/mL (ref 200–1100)

## 2021-03-27 LAB — TSH: TSH: 1.28 mIU/L (ref 0.40–4.50)

## 2021-04-09 ENCOUNTER — Telehealth: Payer: Self-pay | Admitting: Internal Medicine

## 2021-04-09 NOTE — Telephone Encounter (Signed)
Hi Dr. Henrene Pastor,   Patient called and said she is a close friend of yours and was referred to our office for IBS w\diarrhea however she is not sure about having to see a GI provider or an Endocrinologist for her symptoms. Seeking your advise and asked if you can call her.

## 2021-04-17 ENCOUNTER — Encounter: Payer: Self-pay | Admitting: Family Medicine

## 2021-04-19 ENCOUNTER — Encounter: Payer: Self-pay | Admitting: Family Medicine

## 2021-04-19 NOTE — Telephone Encounter (Signed)
Please advise, thanks.

## 2021-04-19 NOTE — Telephone Encounter (Signed)
I spoke with the patient.  She tells me that she has had diarrhea predominant irritable bowel syndrome for many years.  Previously treated by Dr. Earlean Shawl.  Has had periodic colonoscopy.  Last one about 4 to 5 years ago.  Symptoms are most prominent in the morning.  Significant urgency when present.  Rare incontinence.  Also has PMR and thyroid disease.  She has tried several agents without improvement.  Also tried elimination diet.  Exercise helps.  Anxiety hurts.  Recent pre and probiotic of questionable help.  She inquires about seeing an endocrinologist, physical therapy, and biofeedback.  Interestingly, son with complete colectomy for moderately medically refractory ulcerative colitis. She will gather records from her previous gastroenterologist and present those for my review.  Thereafter, we will schedule her an office appointment.

## 2021-04-28 ENCOUNTER — Telehealth: Payer: Self-pay

## 2021-04-28 NOTE — Telephone Encounter (Signed)
-----   Message from Irene Shipper, MD sent at 04/28/2021 12:36 PM EST ----- Regarding: Office appointment in January Alwaleed Obeso, This is a friend of the family.  I have reviewed her outside records.  Please contact her and schedule her for an office visit sometime in January (before the 28th). Thanks, Dr. Henrene Pastor

## 2021-04-28 NOTE — Telephone Encounter (Signed)
Pt scheduled to see Dr. Henrene Pastor 06/08/21 at 11:30am.

## 2021-04-29 NOTE — Telephone Encounter (Signed)
Spoke with pt and she is aware of appt. 

## 2021-05-13 ENCOUNTER — Encounter: Payer: Self-pay | Admitting: Family Medicine

## 2021-05-13 DIAGNOSIS — M353 Polymyalgia rheumatica: Secondary | ICD-10-CM | POA: Insufficient documentation

## 2021-05-13 DIAGNOSIS — D509 Iron deficiency anemia, unspecified: Secondary | ICD-10-CM | POA: Insufficient documentation

## 2021-05-13 DIAGNOSIS — M064 Inflammatory polyarthropathy: Secondary | ICD-10-CM | POA: Insufficient documentation

## 2021-05-13 DIAGNOSIS — Z7952 Long term (current) use of systemic steroids: Secondary | ICD-10-CM | POA: Insufficient documentation

## 2021-06-08 ENCOUNTER — Encounter: Payer: Self-pay | Admitting: Internal Medicine

## 2021-06-08 ENCOUNTER — Ambulatory Visit (INDEPENDENT_AMBULATORY_CARE_PROVIDER_SITE_OTHER): Payer: Medicare Other | Admitting: Internal Medicine

## 2021-06-08 VITALS — BP 122/60 | HR 53 | Ht 64.0 in | Wt 124.0 lb

## 2021-06-08 DIAGNOSIS — K589 Irritable bowel syndrome without diarrhea: Secondary | ICD-10-CM | POA: Diagnosis not present

## 2021-06-08 DIAGNOSIS — R197 Diarrhea, unspecified: Secondary | ICD-10-CM

## 2021-06-08 DIAGNOSIS — F411 Generalized anxiety disorder: Secondary | ICD-10-CM | POA: Diagnosis not present

## 2021-06-08 MED ORDER — SERTRALINE HCL 25 MG PO TABS: 25.0000 mg | ORAL_TABLET | Freq: Every day | ORAL | 6 refills | Status: DC

## 2021-06-08 NOTE — Progress Notes (Signed)
HISTORY OF PRESENT ILLNESS:  Annette Barker is a pleasant 76 y.o. female, interior Hotel manager and personal acquaintance, with hyperlipidemia, hypothyroidism, PMR, osteoarthritis, anxiety, and diarrhea predominant irritable bowel syndrome.  She presents today regarding issues with irritable bowel and intermittent incontinence.  I have reviewed some outside records from her previous GI provider, Dr. Earlean Shawl.  Patient tells me that she has had issues with diarrhea predominant irritable bowel syndrome for many years.  There is a component of urgency which has worsened with time.  As well intermittent problems with fecal incontinence, which also has worsened.  At this point, she has daily bowel movements.  She will rarely not have a bowel movement if traveling.  She typically has 1 or 2 bowel movements per day which are formed and controlled though there remains some urgency.  She describes having "flares" several times per month.  At this time she will experience 7 or 8 soft stools with worsening urgency and incontinence.  There can be abdominal cramping for which Bentyl does not seem to help.  Incontinence occurs about once per month.  She does wear protective pads.  She tells me that her symptoms are exacerbated by anxiety and situational anxiety such as being in places where it may be difficult to quickly find a restroom.  This has caused her to alter her lifestyle.  She is active and eats healthy.  She has plans to see a biofeedback specialist in [place] at the end of January.  She also inquires about physical therapy evaluation regarding incontinence.  Her gallbladder is intact.  She does not use antidiarrheals with any regularity.  It sounds like she has tried Questran in the past for a few days but felt this may have flared her condition or she was taking it during a flare, not sure.  Did not try again.  She has not tried Diplomatic Services operational officer.  No regular fiber supplements though on a high-fiber diet.  She is  used Imodium quite sparingly.  She does take 0.125 mg of Valium at bedtime.  She denies having been on antidepressants for her IBS.  She is currently living in [place] but looking for a condominium in [place] area.  She has tried probiotic and prebiotic without benefit.  She has had multiple colonoscopies.  Colonoscopy in 2002 with Dr. Olevia Perches with diminutive adenoma.  Subsequent colonoscopies in 2010 and 2017 with Dr. Earlean Shawl.  Random colon biopsies in 2010 were normal.  No microscopic colitis.  Colonoscopy in 2017 was unremarkable.  She did have CT scan of the abdomen and pelvis with contrast November 2015.  No acute abdominal process.  She was having constipation at the time and a large stool burden noted on CT.  Review of blood work from March 26, 2021 shows normal comprehensive metabolic panel.  Normal CBC with hemoglobin 11.7.  Normal TSH.  REVIEW OF SYSTEMS:  All non-GI ROS negative unless otherwise stated in the HPI.  Past Medical History:  Diagnosis Date   Anxiety    Arthritis    in her hands    Elevated cholesterol    Hair loss    Hemorrhoids    Hyperlipidemia    Hypothyroidism    on synthroid   IBS (irritable bowel syndrome)    Lyme disease    Osteoporosis 03/2017   T score -3.2   Thyroid disease    hypothyroid    Past Surgical History:  Procedure Laterality Date   LAPAROSCOPIC SALPINGO OOPHERECTOMY Bilateral 10/29/2014   Procedure:  LAPAROSCOPIC SALPINGO OOPHORECTOMY;  Surgeon: [name], MD;  Location: [facility];  Service: Gynecology;  Laterality: Bilateral;  1:00pm OR time requested  1 1/2 hours OR time requested.   SHOULDER SURGERY     TUBAL LIGATION      Social History Annette Barker  reports that she quit smoking about 30 years ago. Her smoking use included cigarettes. She has never used smokeless tobacco. She reports current alcohol use. She reports that she does not use drugs.  family history includes Alcohol abuse in her sister; Alzheimer's disease  in her mother; Anorexia nervosa in her grandchild; Arthritis in her father, mother, sister, and sister; Cancer in her father and sister; Colitis in her son; Heart disease in her sister; Hyperlipidemia in her father; Osteoporosis in her father.  Allergies  Allergen Reactions   Hydromet [Hydrocodone Bit-Homatrop Mbr] Itching and Swelling   Codeine Swelling   Ibuprofen     Other reaction(s): reflux   Statins Other (See Comments)    Body cramps all over body       PHYSICAL EXAMINATION: Vital signs: BP 122/60    Pulse (!) 53    Ht 5\' 4"  (1.626 m)    Wt 124 lb (56.2 kg)    LMP 05/23/1998    BMI 21.28 kg/m   Constitutional: generally well-appearing, no acute distress Psychiatric: alert and oriented x3, cooperative Eyes: extraocular movements intact, anicteric, conjunctiva pink Mouth: oral pharynx moist, no lesions Neck: supple no lymphadenopathy Cardiovascular: heart regular rate and rhythm, no murmur Lungs: clear to auscultation bilaterally Abdomen: soft, nontender, nondistended, no obvious ascites, no peritoneal signs, normal bowel sounds, no organomegaly Rectal: Omitted Extremities: no clubbing, cyanosis, or lower extremity edema bilaterally Skin: no lesions on visible extremities Neuro: No focal deficits.  Cranial nerves intact  ASSESSMENT:  1.  Irritable bowel syndrome with with urgency and intermittent loose stools with incontinence as described. 2.  Anxiety 3.  Incontinence of feces 4.  History of adenomatous colon polyps.  Previous colonoscopic examinations 2002, 2010, 2017   PLAN:  1.  Initiate Zoloft 25 mg at night.  Medication risks and side effects reviewed. 2.  Imodium once daily.  Adjust as needed.  Hold for constipation 3.  Referral for physical therapy regarding fecal incontinence 4.  Continue to wear protective undergarments 5.  Patient will follow through with her self scheduled biofeedback assessment in [place] 6.  Routine office follow-up 1 month.   Contact the office in the interim for any questions or problems.  Consider Citrucel.  Consider Xifaxan 7.  Extensive discussion of irritable bowel syndrome.  Treatment strategies and goals outlined.  A total time of 60 minutes was spent preparing to see the patient,, reviewing outside endoscopy reports, pathology, laboratory testing, and radiologic imaging.  Obtaining comprehensive history, performing comprehensive physical examination, counseling the patient regarding the above listed issues, ordering medications, arranging consultative referral, arranging follow-up, and documenting clinical information in the health record

## 2021-06-08 NOTE — Patient Instructions (Signed)
If you are age 74 or older, your body mass index should be between 23-30. Your Body mass index is 21.28 kg/m. If this is out of the aforementioned range listed, please consider follow up with your Primary Care Provider.  If you are age 46 or younger, your body mass index should be between 19-25. Your Body mass index is 21.28 kg/m. If this is out of the aformentioned range listed, please consider follow up with your Primary Care Provider.   ________________________________________________________  The  GI providers would like to encourage you to use [facility] to communicate with providers for non-urgent requests or questions.  Due to long hold times on the telephone, sending your provider a message by [facility] may be a faster and more efficient way to get a response.  Please allow 48 business hours for a response.  Please remember that this is for non-urgent requests.  _______________________________________________________  We have sent the following medications to your pharmacy for you to pick up at your convenience:  Zoloft  Take Imodium daily.  I have put in a referral to physical therapy.  Please follow up on _________________________

## 2021-06-13 ENCOUNTER — Encounter: Payer: Self-pay | Admitting: Family Medicine

## 2021-06-13 ENCOUNTER — Encounter: Payer: Self-pay | Admitting: Internal Medicine

## 2021-06-14 ENCOUNTER — Telehealth: Payer: Self-pay

## 2021-06-14 ENCOUNTER — Other Ambulatory Visit: Payer: Self-pay | Admitting: Family Medicine

## 2021-06-14 DIAGNOSIS — R197 Diarrhea, unspecified: Secondary | ICD-10-CM

## 2021-06-14 MED ORDER — DIAZEPAM 5 MG PO TABS: ORAL_TABLET | ORAL | 0 refills | Status: DC

## 2021-06-14 NOTE — Telephone Encounter (Signed)
Referral to [facility] for pelvic floor therapy faxed

## 2021-06-14 NOTE — Telephone Encounter (Signed)
Diazepam refill request.  Last seen 03/2021, last filled 02/2021.

## 2021-06-21 DIAGNOSIS — M353 Polymyalgia rheumatica: Secondary | ICD-10-CM | POA: Diagnosis not present

## 2021-06-21 DIAGNOSIS — Z6821 Body mass index (BMI) 21.0-21.9, adult: Secondary | ICD-10-CM | POA: Diagnosis not present

## 2021-06-21 DIAGNOSIS — M2550 Pain in unspecified joint: Secondary | ICD-10-CM | POA: Diagnosis not present

## 2021-06-21 DIAGNOSIS — Z7952 Long term (current) use of systemic steroids: Secondary | ICD-10-CM | POA: Diagnosis not present

## 2021-07-07 DIAGNOSIS — M62838 Other muscle spasm: Secondary | ICD-10-CM | POA: Diagnosis not present

## 2021-07-07 DIAGNOSIS — R102 Pelvic and perineal pain: Secondary | ICD-10-CM | POA: Diagnosis not present

## 2021-07-07 DIAGNOSIS — M6281 Muscle weakness (generalized): Secondary | ICD-10-CM | POA: Diagnosis not present

## 2021-07-07 DIAGNOSIS — M6289 Other specified disorders of muscle: Secondary | ICD-10-CM | POA: Diagnosis not present

## 2021-07-12 ENCOUNTER — Ambulatory Visit (INDEPENDENT_AMBULATORY_CARE_PROVIDER_SITE_OTHER): Payer: Medicare Other | Admitting: Internal Medicine

## 2021-07-12 ENCOUNTER — Encounter: Payer: Self-pay | Admitting: Internal Medicine

## 2021-07-12 VITALS — BP 128/68 | HR 68 | Ht 64.0 in | Wt 123.0 lb

## 2021-07-12 DIAGNOSIS — R151 Fecal smearing: Secondary | ICD-10-CM

## 2021-07-12 DIAGNOSIS — R197 Diarrhea, unspecified: Secondary | ICD-10-CM | POA: Diagnosis not present

## 2021-07-12 DIAGNOSIS — K589 Irritable bowel syndrome without diarrhea: Secondary | ICD-10-CM | POA: Diagnosis not present

## 2021-07-12 DIAGNOSIS — F411 Generalized anxiety disorder: Secondary | ICD-10-CM | POA: Diagnosis not present

## 2021-07-12 NOTE — Patient Instructions (Signed)
If you are age 74 or older, your body mass index should be between 23-30. Your Body mass index is 21.11 kg/m. If this is out of the aforementioned range listed, please consider follow up with your Primary Care Provider.  If you are age 109 or younger, your body mass index should be between 19-25. Your Body mass index is 21.11 kg/m. If this is out of the aformentioned range listed, please consider follow up with your Primary Care Provider.   ________________________________________________________  The [place] [facility] providers would like to encourage you to use [facility] to communicate with providers for non-urgent requests or questions.  Due to long hold times on the telephone, sending your provider a message by [facility] may be a faster and more efficient way to get a response.  Please allow 48 business hours for a response.  Please remember that this is for non-urgent requests.  _______________________________________________________  Take 1 tablespoon of Citrucel daily for 2 weeks then increase to 2 tablespoons.  Please follow up in 3 months

## 2021-07-12 NOTE — Progress Notes (Signed)
HISTORY OF PRESENT ILLNESS:  Annette Barker is a 76 y.o. female who was initially evaluated June 08, 2021 regarding irritable bowel syndrome with urgency, intermittent loose stools, and occasional incontinence.  See that dictation.  I have prescribed Zoloft 25 mg at night.  The patient decided not to initiate this medical therapy after reviewing side effect profile.  She did however use Imodium more liberally.  This is helped.  Overall she is had less issues, even when she was out in [place].  She did have 1 bad flare the day following significant champagne consumption.  No new complaints.  She did see a physical therapist last week.  They suggested fiber supplementation.  She is working on pelvic exercises.  She does not follow-up.  She is quite pleased.  REVIEW OF SYSTEMS:  All non-GI ROS negative. Past Medical History:  Diagnosis Date   Anxiety    Arthritis    in her hands    Elevated cholesterol    Hair loss    Hemorrhoids    Hyperlipidemia    Hypothyroidism    on synthroid   IBS (irritable bowel syndrome)    Lyme disease    Osteoporosis 03/2017   T score -3.2   Thyroid disease    hypothyroid    Past Surgical History:  Procedure Laterality Date   LAPAROSCOPIC SALPINGO OOPHERECTOMY Bilateral 10/29/2014   Procedure: LAPAROSCOPIC SALPINGO OOPHORECTOMY;  Surgeon: [name], MD;  Location: [facility];  Service: Gynecology;  Laterality: Bilateral;  1:00pm OR time requested  1 1/2 hours OR time requested.   SHOULDER SURGERY     TUBAL LIGATION      Social History [name]  reports that she quit smoking about 30 years ago. Her smoking use included cigarettes. She has never used smokeless tobacco. She reports current alcohol use. She reports that she does not use drugs.  family history includes Alcohol abuse in her sister; Alzheimer's disease in her mother; Anorexia nervosa in her grandchild; Arthritis in her father, mother, sister, and sister; Cancer in her father  and sister; Colitis in her son; Heart disease in her sister; Hyperlipidemia in her father; Osteoporosis in her father.  Allergies  Allergen Reactions   Hydromet [Hydrocodone Bit-Homatrop Mbr] Itching and Swelling   Codeine Swelling   Ibuprofen     Other reaction(s): reflux   Statins Other (See Comments)    Body cramps all over body       PHYSICAL EXAMINATION: Vital signs: BP 128/68    Pulse 68    Ht 5\' 4"  (1.626 m)    Wt 123 lb (55.8 kg)    LMP 05/23/1998    SpO2 98%    BMI 21.11 kg/m   Constitutional: generally well-appearing, no acute distress Psychiatric: alert and oriented x3, cooperative Eyes: extraocular movements intact, anicteric, conjunctiva pink Mouth: oral pharynx moist, no lesions Neck: supple no lymphadenopathy Cardiovascular: heart regular rate and rhythm, no murmur Lungs: clear to auscultation bilaterally Abdomen: soft, nontender, nondistended, no obvious ascites, no peritoneal signs, normal bowel sounds, no organomegaly Rectal: Omitted Extremities: no clubbing, cyanosis, or lower extremity edema bilaterally Skin: no lesions on visible extremities Neuro: No focal deficits.  Cranial nerves intact  ASSESSMENT:  1.  Irritable bowel syndrome associated with urgency/loose stools and occasional incontinence. 2.  Anxiety 3.  History of adenomatous colon polyps.  Previous examinations 2002, 2010, 2017.  Last examination negative for neoplasia   PLAN:  1.  Add Citrucel 1 tablespoon daily for 2 weeks  then 2 tablespoons daily 2.  Continue to use Imodium as needed 3.  Continue with physical therapy 4.  Routine office follow-up 3 months.  Contact the office in the interim for any questions or problems.  Consider Zoloft.  Consider Xifaxan.

## 2021-07-21 ENCOUNTER — Telehealth: Payer: Self-pay

## 2021-07-21 MED ORDER — LEVOTHYROXINE SODIUM 75 MCG PO TABS: 75.0000 ug | ORAL_TABLET | Freq: Every day | ORAL | 3 refills | Status: DC

## 2021-07-21 NOTE — Telephone Encounter (Signed)
Pharmacy faxed refill request for  ? ?levothyroxine (SYNTHROID) 75 MCG tablet [[id number]]  ?  Order Details ?Dose, Route, Frequency: As Directed  ?Dispense Quantity: 90 tablet Refills: 1   ?     ?Sig: TAKE 1 TABLET 7MCG BY MOUTH DAILY BEFORE BREAKFAST  ? ?

## 2021-07-21 NOTE — Telephone Encounter (Signed)
Refill sent to pharmacy.   

## 2021-07-22 DIAGNOSIS — R102 Pelvic and perineal pain: Secondary | ICD-10-CM | POA: Diagnosis not present

## 2021-07-22 DIAGNOSIS — M6289 Other specified disorders of muscle: Secondary | ICD-10-CM | POA: Diagnosis not present

## 2021-07-22 DIAGNOSIS — M6281 Muscle weakness (generalized): Secondary | ICD-10-CM | POA: Diagnosis not present

## 2021-07-22 DIAGNOSIS — M62838 Other muscle spasm: Secondary | ICD-10-CM | POA: Diagnosis not present

## 2021-07-26 ENCOUNTER — Encounter: Payer: Self-pay | Admitting: Family Medicine

## 2021-07-27 NOTE — Telephone Encounter (Signed)
Please advise on request for cream. Thanks! ?

## 2021-07-29 DIAGNOSIS — M6281 Muscle weakness (generalized): Secondary | ICD-10-CM | POA: Diagnosis not present

## 2021-07-29 DIAGNOSIS — M6289 Other specified disorders of muscle: Secondary | ICD-10-CM | POA: Diagnosis not present

## 2021-07-29 DIAGNOSIS — R159 Full incontinence of feces: Secondary | ICD-10-CM | POA: Diagnosis not present

## 2021-07-29 DIAGNOSIS — M62838 Other muscle spasm: Secondary | ICD-10-CM | POA: Diagnosis not present

## 2021-08-09 DIAGNOSIS — R159 Full incontinence of feces: Secondary | ICD-10-CM | POA: Diagnosis not present

## 2021-08-09 DIAGNOSIS — M6289 Other specified disorders of muscle: Secondary | ICD-10-CM | POA: Diagnosis not present

## 2021-08-09 DIAGNOSIS — M62838 Other muscle spasm: Secondary | ICD-10-CM | POA: Diagnosis not present

## 2021-08-09 DIAGNOSIS — M6281 Muscle weakness (generalized): Secondary | ICD-10-CM | POA: Diagnosis not present

## 2021-08-12 DIAGNOSIS — L821 Other seborrheic keratosis: Secondary | ICD-10-CM | POA: Diagnosis not present

## 2021-08-12 DIAGNOSIS — L819 Disorder of pigmentation, unspecified: Secondary | ICD-10-CM | POA: Diagnosis not present

## 2021-08-12 DIAGNOSIS — D1801 Hemangioma of skin and subcutaneous tissue: Secondary | ICD-10-CM | POA: Diagnosis not present

## 2021-08-12 DIAGNOSIS — L738 Other specified follicular disorders: Secondary | ICD-10-CM | POA: Diagnosis not present

## 2021-08-18 DIAGNOSIS — R159 Full incontinence of feces: Secondary | ICD-10-CM | POA: Diagnosis not present

## 2021-08-18 DIAGNOSIS — M6281 Muscle weakness (generalized): Secondary | ICD-10-CM | POA: Diagnosis not present

## 2021-08-18 DIAGNOSIS — M62838 Other muscle spasm: Secondary | ICD-10-CM | POA: Diagnosis not present

## 2021-08-18 DIAGNOSIS — M6289 Other specified disorders of muscle: Secondary | ICD-10-CM | POA: Diagnosis not present

## 2021-08-26 ENCOUNTER — Other Ambulatory Visit: Payer: Self-pay | Admitting: Family Medicine

## 2021-08-26 NOTE — Telephone Encounter (Signed)
Per chart prednisone is not on patient's current med list.  ? ?Last rx 02/05/21 #45 ? ?Please advise on refill request, thanks! ?

## 2021-09-01 DIAGNOSIS — R159 Full incontinence of feces: Secondary | ICD-10-CM | POA: Diagnosis not present

## 2021-09-01 DIAGNOSIS — R152 Fecal urgency: Secondary | ICD-10-CM | POA: Diagnosis not present

## 2021-09-01 DIAGNOSIS — M62838 Other muscle spasm: Secondary | ICD-10-CM | POA: Diagnosis not present

## 2021-09-01 DIAGNOSIS — M6289 Other specified disorders of muscle: Secondary | ICD-10-CM | POA: Diagnosis not present

## 2021-09-01 DIAGNOSIS — M6281 Muscle weakness (generalized): Secondary | ICD-10-CM | POA: Diagnosis not present

## 2021-09-03 ENCOUNTER — Other Ambulatory Visit: Payer: Self-pay | Admitting: Family Medicine

## 2021-09-20 ENCOUNTER — Encounter: Payer: Self-pay | Admitting: Family Medicine

## 2021-09-23 NOTE — Telephone Encounter (Signed)
Call pt to make an appointment if she would ?

## 2021-09-23 NOTE — Telephone Encounter (Signed)
Want to make an appointment ?

## 2021-09-24 ENCOUNTER — Ambulatory Visit (INDEPENDENT_AMBULATORY_CARE_PROVIDER_SITE_OTHER): Payer: Medicare Other | Admitting: Family Medicine

## 2021-09-24 VITALS — BP 98/62 | HR 65 | Temp 98.5°F | Ht 64.0 in | Wt 122.4 lb

## 2021-09-24 DIAGNOSIS — J310 Chronic rhinitis: Secondary | ICD-10-CM

## 2021-09-24 DIAGNOSIS — J329 Chronic sinusitis, unspecified: Secondary | ICD-10-CM | POA: Diagnosis not present

## 2021-09-24 MED ORDER — AMOXICILLIN-POT CLAVULANATE 875-125 MG PO TABS: 1.0000 | ORAL_TABLET | Freq: Two times a day (BID) | ORAL | 0 refills | Status: DC

## 2021-09-24 MED ORDER — FLUCONAZOLE 150 MG PO TABS: 150.0000 mg | ORAL_TABLET | Freq: Once | ORAL | 0 refills | Status: AC

## 2021-09-24 NOTE — Progress Notes (Signed)
? ?Subjective:  ? ? Patient ID: Annette Barker, female    DOB: 01/27/1946, 76 y.o.   MRN: [id number] ? ?HPI ?Symptoms began 10 or 11 days ago with cough, head congestion, and rhinorrhea.  She continues to have cough and she reports some wheezing.  She also reports a dull headache in her frontal sinus.  There is tenderness to percussion over the frontal sinus.  She reports postnasal drip and drainage.  Mucinex helps with the symptoms.  She reports pain in her left ear.  She reports no sore throat.  She denies any chest pain or shortness of breath but she does report rhonchorous breath sounds and wheezing occasionally ?Past Medical History:  ?Diagnosis Date  ? Anxiety   ? Arthritis   ? in her hands   ? Elevated cholesterol   ? Hair loss   ? Hemorrhoids   ? Hyperlipidemia   ? Hypothyroidism   ? on synthroid  ? IBS (irritable bowel syndrome)   ? Lyme disease   ? Osteoporosis 03/2017  ? T score -3.2  ? Thyroid disease   ? hypothyroid  ? ?Past Surgical History:  ?Procedure Laterality Date  ? LAPAROSCOPIC SALPINGO OOPHERECTOMY Bilateral 10/29/2014  ? Procedure: LAPAROSCOPIC SALPINGO OOPHORECTOMY;  Surgeon: [name], MD;  Location: [facility];  Service: Gynecology;  Laterality: Bilateral;  1:00pm OR time requested ? ?1 1/2 hours OR time requested.  ? SHOULDER SURGERY    ? TUBAL LIGATION    ? ?Current Outpatient Medications on File Prior to Visit  ?Medication Sig Dispense Refill  ? diazepam (VALIUM) 5 MG tablet TAKE 1 TABLET BY MOUTH EVERYDAY AT BEDTIME 30 tablet 0  ? diphenhydrAMINE HCl (BENADRYL PO) Take by mouth.    ? estradiol (ESTRACE VAGINAL) 0.1 MG/GM vaginal cream Place 1 Applicatorful vaginally at bedtime. 42.5 g 12  ? levothyroxine (SYNTHROID) 75 MCG tablet Take 1 tablet (75 mcg total) by mouth daily before breakfast. 90 tablet 3  ? triamcinolone cream (KENALOG) 0.1 % Apply 1 application topically 2 (two) times daily. 30 g 0  ? ?No current facility-administered medications on file prior to visit.  ? ?Allergies   ?Allergen Reactions  ? Hydromet [Hydrocodone Bit-Homatrop Mbr] Itching and Swelling  ? Codeine Swelling  ? Ibuprofen   ?  Other reaction(s): reflux  ? Statins Other (See Comments)  ?  Body cramps all over body  ? ?Social History  ? ?Socioeconomic History  ? Marital status: Widowed  ?  Spouse name: Not on file  ? Number of children: Not on file  ? Years of education: Not on file  ? Highest education level: Not on file  ?Occupational History  ? Not on file  ?Tobacco Use  ? Smoking status: Former  ?  Types: Cigarettes  ?  Quit date: 05/27/1991  ?  Years since quitting: 30.3  ? Smokeless tobacco: Never  ?Vaping Use  ? Vaping Use: Never used  ?Substance and Sexual Activity  ? Alcohol use: Yes  ?  Alcohol/week: 0.0 standard drinks  ?  Comment: rare  ? Drug use: No  ? Sexual activity: Not Currently  ?  Birth control/protection: Post-menopausal  ?  Comment: 1st intercourse 76 yo--Fewer than 5 partners  ?Other Topics Concern  ? Not on file  ?Social History Narrative  ? Not on file  ? ?Social Determinants of Health  ? ?Financial Resource Strain: Low Risk   ? Difficulty of Paying Living Expenses: Not hard at all  ?Food Insecurity: No Food  Insecurity  ? Worried About Charity fundraiser in the Last Year: Never true  ? Ran Out of Food in the Last Year: Never true  ?Transportation Needs: No Transportation Needs  ? Lack of Transportation (Medical): No  ? Lack of Transportation (Non-Medical): No  ?Physical Activity: Sufficiently Active  ? Days of Exercise per Week: 7 days  ? Minutes of Exercise per Session: 30 min  ?Stress: No Stress Concern Present  ? Feeling of Stress : Not at all  ?Social Connections: Moderately Isolated  ? Frequency of Communication with Friends and Family: More than three times a week  ? Frequency of Social Gatherings with Friends and Family: More than three times a week  ? Attends Religious Services: Never  ? Active Member of Clubs or Organizations: No  ? Attends Archivist Meetings: Never  ? Marital  Status: Living with partner  ?Intimate Partner Violence: Not At Risk  ? Fear of Current or Ex-Partner: No  ? Emotionally Abused: No  ? Physically Abused: No  ? Sexually Abused: No  ? ? ? ? ?Review of Systems  ?All other systems reviewed and are negative. ? ?   ?Objective:  ? Physical Exam ?Vitals reviewed.  ?Constitutional:   ?   General: She is not in acute distress. ?   Appearance: Normal appearance. She is not ill-appearing, toxic-appearing or diaphoretic.  ?HENT:  ?   Head: Normocephalic and atraumatic.  ?   Right Ear: Tympanic membrane and ear canal normal.  ?   Left Ear: Tympanic membrane and ear canal normal.  ?   Nose: No congestion or rhinorrhea.  ?   Right Turbinates: Swollen.  ?   Left Turbinates: Swollen.  ?   Right Sinus: Frontal sinus tenderness present. No maxillary sinus tenderness.  ?   Left Sinus: Frontal sinus tenderness present. No maxillary sinus tenderness.  ?   Mouth/Throat:  ?   Mouth: Mucous membranes are moist.  ?   Pharynx: No oropharyngeal exudate or posterior oropharyngeal erythema.  ?Eyes:  ?   General: No scleral icterus.    ?   Right eye: No discharge.     ?   Left eye: No discharge.  ?   Extraocular Movements: Extraocular movements intact.  ?   Conjunctiva/sclera: Conjunctivae normal.  ?   Pupils: Pupils are equal, round, and reactive to light.  ?Cardiovascular:  ?   Rate and Rhythm: Normal rate and regular rhythm.  ?   Pulses: Normal pulses.  ?   Heart sounds: Normal heart sounds. No murmur heard. ?  No friction rub. No gallop.  ?Pulmonary:  ?   Effort: Pulmonary effort is normal. No respiratory distress.  ?   Breath sounds: Normal breath sounds. No stridor. No wheezing, rhonchi or rales.  ?Chest:  ?   Chest wall: No tenderness.  ?Musculoskeletal:  ?   Right foot: Normal range of motion. No deformity, bunion, Charcot foot or prominent metatarsal heads.  ?   Left foot: Normal range of motion. No deformity, bunion, Charcot foot or prominent metatarsal heads.  ?Feet:  ?   Right foot:   ?   Skin integrity: Skin integrity normal.  ?   Left foot:  ?   Skin integrity: Skin integrity normal.  ?Skin: ?   Coloration: Skin is not jaundiced.  ?   Findings: Rash present. No erythema.  ?Neurological:  ?   General: No focal deficit present.  ?   Mental Status: She is alert and  oriented to person, place, and time. Mental status is at baseline.  ?   Motor: No weakness.  ?   Coordination: Coordination normal.  ? ? ? ? ? ?   ?Assessment & Plan:  ?Rhinosinusitis ?I believe the patient likely had a cold or allergies but has developed a secondary bacterial sinus infection as indicated by the pain in her frontal sinus, the headache, eustachian tube dysfunction and a persistent postnasal drip.  I will treat the patient with Augmentin 875 mg twice daily for 10 days.  She can also use Mucinex for congestion.  She does have a rash over the medial surface of her left arm.  The rash consists of 5 or 6 urticarial hives in a linear distribution.  It does not appear to be shingles.  I suspect that these are bug bites.  They are itching.  Patient can treat this with topical cortisone cream. ?

## 2021-09-28 DIAGNOSIS — M62838 Other muscle spasm: Secondary | ICD-10-CM | POA: Diagnosis not present

## 2021-09-28 DIAGNOSIS — M6281 Muscle weakness (generalized): Secondary | ICD-10-CM | POA: Diagnosis not present

## 2021-09-28 DIAGNOSIS — M6289 Other specified disorders of muscle: Secondary | ICD-10-CM | POA: Diagnosis not present

## 2021-09-28 DIAGNOSIS — R159 Full incontinence of feces: Secondary | ICD-10-CM | POA: Diagnosis not present

## 2021-09-30 ENCOUNTER — Other Ambulatory Visit: Payer: Self-pay | Admitting: Family Medicine

## 2021-09-30 ENCOUNTER — Other Ambulatory Visit: Payer: Self-pay

## 2021-09-30 MED ORDER — DIAZEPAM 5 MG PO TABS: ORAL_TABLET | ORAL | 0 refills | Status: DC

## 2021-09-30 NOTE — Telephone Encounter (Signed)
Received [facility] from [facility] [address] requesting refill on diazepam (VALIUM) 5 MG tablet [[id number]]  ?  Order Details ?Dose, Route, Frequency: As Directed  ?Dispense Quantity: 30 tablet Refills: 0   ?Note to Pharmacy: This request is for a new prescription for a controlled substance as required by Federal/State law.  ?     ?Sig: TAKE 1 TABLET BY MOUTH EVERYDAY AT BEDTIME  ?     ?Start Date: 06/14/21 End Date: --  ?Written Date: 06/14/21 Expiration Date: 12/11/21  ?Original Order:  diazepam (VALIUM) 5 MG tablet [[id number]]  ?Providers ? ?Authorizing Provider:   ?Susy Frizzle, MD  ?[address] [place], [place] [address]  ?Phone:  [phone]   Fax:  [phone]  ?DEA #:  [id number]   NPI:  [id number]   ?   ?Ordering User:  Susy Frizzle, MD   ?   ? ?Pharmacy ? ?[id number]  ? ?

## 2021-09-30 NOTE — Telephone Encounter (Signed)
LOV 09/24/21 ?Last refill 06/14/21, #30, 0 refills ? ?Please review, thanks! ? ?

## 2021-09-30 NOTE — Telephone Encounter (Signed)
Requested medication (s) are due for refill today - yes ? ?Requested medication (s) are on the active medication list -yes ? ?Future visit scheduled -no ? ?Last refill: 06/14/21 #30 ? ?Notes to clinic: non delegated Rx ? ?Requested Prescriptions  ?Pending Prescriptions Disp Refills  ? diazepam (VALIUM) 5 MG tablet 30 tablet 0  ?  Sig: TAKE 1 TABLET BY MOUTH EVERYDAY AT BEDTIME  ?  ? Not Delegated - Psychiatry: Anxiolytics/Hypnotics 2 Failed - 09/30/2021 12:28 PM  ?  ?  Failed - This refill cannot be delegated  ?  ?  Failed - Urine Drug Screen completed in last 360 days  ?  ?  Passed - Patient is not pregnant  ?  ?  Passed - Valid encounter within last 6 months  ?  Recent Outpatient Visits   ? ?      ? 6 days ago Rhinosinusitis  ? [facility], Cammie Mcgee, MD  ? 6 months ago Neuropathy  ? [facility], Cammie Mcgee, MD  ? 1 year ago Concussion without loss of consciousness, initial encounter  ? [facility], Cammie Mcgee, MD  ? 1 year ago Weight loss  ? [facility], Cammie Mcgee, MD  ? 1 year ago Pain of left clavicle  ? [facility], Cammie Mcgee, MD  ? ?  ?  ? ? ?  ?  ?  ? ? ? ?Requested Prescriptions  ?Pending Prescriptions Disp Refills  ? diazepam (VALIUM) 5 MG tablet 30 tablet 0  ?  Sig: TAKE 1 TABLET BY MOUTH EVERYDAY AT BEDTIME  ?  ? Not Delegated - Psychiatry: Anxiolytics/Hypnotics 2 Failed - 09/30/2021 12:28 PM  ?  ?  Failed - This refill cannot be delegated  ?  ?  Failed - Urine Drug Screen completed in last 360 days  ?  ?  Passed - Patient is not pregnant  ?  ?  Passed - Valid encounter within last 6 months  ?  Recent Outpatient Visits   ? ?      ? 6 days ago Rhinosinusitis  ? [facility], Cammie Mcgee, MD  ? 6 months ago Neuropathy  ? [facility], Cammie Mcgee, MD  ? 1 year ago Concussion without loss of consciousness, initial encounter  ? [facility] [place], Cammie Mcgee, MD  ? 1 year ago Weight loss  ? [facility], Cammie Mcgee, MD  ? 1 year ago Pain of left clavicle  ? [facility], Cammie Mcgee, MD  ? ?  ?  ? ? ?  ?  ?  ? ? ? ?

## 2021-10-21 DIAGNOSIS — M6289 Other specified disorders of muscle: Secondary | ICD-10-CM | POA: Diagnosis not present

## 2021-10-21 DIAGNOSIS — R152 Fecal urgency: Secondary | ICD-10-CM | POA: Diagnosis not present

## 2021-10-21 DIAGNOSIS — M6281 Muscle weakness (generalized): Secondary | ICD-10-CM | POA: Diagnosis not present

## 2021-10-21 DIAGNOSIS — M62838 Other muscle spasm: Secondary | ICD-10-CM | POA: Diagnosis not present

## 2021-10-21 DIAGNOSIS — R159 Full incontinence of feces: Secondary | ICD-10-CM | POA: Diagnosis not present

## 2021-10-24 ENCOUNTER — Emergency Department: Admit: 2021-10-24 | Payer: MEDICARE

## 2021-10-24 ENCOUNTER — Inpatient Hospital Stay
Admit: 2021-10-24 | Discharge: 2021-10-24 | Disposition: A | Discharge status: Home / Self Care | Payer: MEDICARE | Attending: Emergency Medicine

## 2021-10-24 DIAGNOSIS — S0990XA Unspecified injury of head, initial encounter: Secondary | ICD-10-CM

## 2021-10-24 DIAGNOSIS — A310 Pulmonary mycobacterial infection: Secondary | ICD-10-CM | POA: Diagnosis not present

## 2021-10-24 DIAGNOSIS — W01198A Fall on same level from slipping, tripping and stumbling with subsequent striking against other object, initial encounter: Secondary | ICD-10-CM | POA: Diagnosis not present

## 2021-10-24 DIAGNOSIS — A312 Disseminated mycobacterium avium-intracellulare complex (DMAC): Secondary | ICD-10-CM | POA: Diagnosis not present

## 2021-10-24 DIAGNOSIS — M542 Cervicalgia: Secondary | ICD-10-CM | POA: Diagnosis not present

## 2021-10-24 DIAGNOSIS — R0781 Pleurodynia: Secondary | ICD-10-CM | POA: Diagnosis not present

## 2021-10-24 DIAGNOSIS — S199XXA Unspecified injury of neck, initial encounter: Secondary | ICD-10-CM | POA: Diagnosis not present

## 2021-10-24 DIAGNOSIS — S0181XA Laceration without foreign body of other part of head, initial encounter: Secondary | ICD-10-CM | POA: Diagnosis not present

## 2021-10-24 LAB — COMPREHENSIVE METABOLIC PANEL
ALT: 12 U/L (ref 0–35)
AST: 19 U/L (ref 0–35)
Albumin/Globulin Ratio: 1.33 (ref 1.00–2.70)
Albumin: 4.4 g/dL (ref 3.5–5.2)
Alk Phosphatase: 87 U/L (ref 35–117)
Anion Gap: 13 mmol/L (ref 2–17)
BUN: 19 mg/dL (ref 8–23)
CO2: 24 mmol/L (ref 22–29)
Calcium: 9.3 mg/dL (ref 8.8–10.2)
Chloride: 99 mmol/L (ref 98–107)
Creatinine: 0.8 mg/dL (ref 0.5–1.0)
Est, Glom Filt Rate: 78 mL/min/1.73m (ref >=60)
Globulin: 3.3 g/dL (ref 1.9–4.4)
Glucose: 92 mg/dL (ref 70–99)
OSMOLALITY CALCULATED: 274 mOsm/kg (ref 270–287)
Potassium: 4.1 mmol/L (ref 3.5–5.3)
Sodium: 136 mmol/L (ref 135–145)
Total Bilirubin: 0.2 mg/dL (ref 0.00–1.20)
Total Protein: 7.7 g/dL (ref 6.4–8.3)

## 2021-10-24 LAB — CBC WITH AUTO DIFFERENTIAL
Absolute Baso #: 0.1 10*3/uL (ref 0.0–0.2)
Absolute Eos #: 0.1 10*3/uL (ref 0.0–0.5)
Absolute Lymph #: 1.7 10*3/uL (ref 1.0–3.2)
Absolute Mono #: 0.6 10*3/uL (ref 0.3–1.0)
Basophils %: 0.9 % (ref 0.0–2.0)
Eosinophils %: 1.5 % (ref 0.0–7.0)
Hematocrit: 36.3 % (ref 34.0–47.0)
Hemoglobin: 12.5 g/dL (ref 11.5–15.7)
Immature Grans (Abs): 0.04 10*3/uL (ref 0.00–0.06)
Immature Granulocytes: 0.6 % (ref 0.0–0.6)
Lymphocytes: 25.1 % (ref 15.0–45.0)
MCH: 33.2 pg (ref 27.0–34.5)
MCHC: 34.4 g/dL (ref 32.0–36.0)
MCV: 96.5 fL (ref 81.0–99.0)
MPV: 9.4 fL (ref 7.2–13.2)
Monocytes: 8 % (ref 4.0–12.0)
NRBC Absolute: 0 10*3/uL (ref 0.000–0.012)
NRBC Automated: 0 % (ref 0.0–0.2)
Neutrophils %: 63.9 % (ref 42.0–74.0)
Neutrophils Absolute: 4.4 10*3/uL (ref 1.6–7.3)
Platelets: 251 10*3/uL (ref 140–440)
RBC: 3.76 x10e6/mcL (ref 3.60–5.20)
RDW: 13.5 % (ref 11.0–16.0)
WBC: 6.9 10*3/uL (ref 3.8–10.6)

## 2021-10-24 MED ORDER — LIDOCAINE-EPINEPHRINE 1 %-1:100000 IJ SOLN
1 %-:00000 | Freq: Once | INTRAMUSCULAR | Status: AC
Start: 2021-10-24 — End: 2021-10-24

## 2021-10-24 MED ORDER — LIDOCAINE-EPINEPHRINE 1 %-1:100000 IJ SOLN
1 %-:00000 | INTRAMUSCULAR | Status: AC
Start: 2021-10-24 — End: 2021-10-24
  Administered 2021-10-24: 23:00:00 20 via INTRADERMAL

## 2021-10-24 MED ORDER — KETOROLAC TROMETHAMINE 15 MG/ML IJ SOLN
15 MG/ML | Freq: Once | INTRAMUSCULAR | Status: AC
Start: 2021-10-24 — End: 2021-10-24
  Administered 2021-10-24: 23:00:00 15 mg via INTRAVENOUS

## 2021-10-24 MED FILL — KETOROLAC TROMETHAMINE 15 MG/ML IJ SOLN: 15 MG/ML | INTRAMUSCULAR | Qty: 1

## 2021-10-24 MED FILL — LIDOCAINE-EPINEPHRINE 1 %-1:100000 IJ SOLN: 1 %-:00000 | INTRAMUSCULAR | Qty: 20

## 2021-10-24 NOTE — ED Provider Notes (Signed)
[facility] EMERGENCY DEPT  EMERGENCY DEPARTMENT ENCOUNTER      Pt Name: Beverly Norman  MRN: [id number]  [place] 1946-03-04  Date of evaluation: 10/24/2021  Provider: Diamantina Monks, MD    [place]       Chief Complaint   Patient presents with    Head Injury    Fall    Rib Pain (injury)     Patient presents to the Emergency Department from the waiting room for injuries from a fall playing pickleball where she suffered a 3cm laceration on her left eyebrow and rib pain. Pt has sharp pain with inspiration and is splinting her torso in triage.          HISTORY OF PRESENT ILLNESS    Beverly Norman is a 76 y.o. female     76 yo F presents after fall playing pickleball. States about 1 hour prior to arrival was going for a ball and tripped forward landing on left chest and hitting left forehead on ground. Sharp rip pain under left breast when breathing. Denies LOC. Denies headache.     The history is provided by the patient.     Nursing Notes were reviewed.    REVIEW OF SYSTEMS       Review of Systems   Constitutional:  Negative for activity change and fever.   HENT:  Negative for congestion and voice change.    Eyes:  Negative for visual disturbance.   Respiratory:  Negative for cough, chest tightness and shortness of breath.    Cardiovascular:  Positive for chest pain. Negative for leg swelling.   Gastrointestinal:  Negative for abdominal pain, nausea and vomiting.   Genitourinary:  Negative for dysuria. Enuresis: Left chest wall pain.  Musculoskeletal:  Negative for neck pain and neck stiffness.   Skin:  Positive for wound. Negative for rash.   Neurological:  Negative for dizziness and headaches.   Psychiatric/Behavioral:  Negative for confusion.    All other systems reviewed and are negative.    Except as noted above the remainder of the review of systems was reviewed and negative.       PAST MEDICAL HISTORY   No past medical history on file.      SURGICAL HISTORY     No past surgical history on file.      CURRENT  MEDICATIONS       Previous Medications    No medications on file       ALLERGIES     Codeine and Statins    FAMILY HISTORY     No family history on file.       SOCIAL HISTORY       Social History     Socioeconomic History    Marital status: Widowed       SCREENINGS         Glasgow Coma Scale  Eye Opening: Spontaneous  Best Verbal Response: Oriented  Best Motor Response: Obeys commands  Glasgow Coma Scale Score: 15                     CIWA Assessment  BP: (!) 171/89  Pulse: 56                 PHYSICAL EXAM       ED Triage Vitals [10/24/21 1717]   BP Temp Temp Source Pulse Respirations SpO2 Height Weight - Scale   (!) 171/89 97.1 F (36.2 C) Oral 56 20 100 % _0  (1.626  m) 122 lb (55.3 kg)       Physical Exam  Vitals and nursing note reviewed.   Constitutional:       General: She is not in acute distress.     Appearance: She is not ill-appearing.   HENT:      Head: Laceration present.      Comments: 3 cm laceration to left eyebrow  Eyes:      Extraocular Movements: Extraocular movements intact.   Cardiovascular:      Rate and Rhythm: Normal rate and regular rhythm.      Pulses: Normal pulses.      Heart sounds: Normal heart sounds. No murmur heard.  Pulmonary:      Effort: Pulmonary effort is normal.      Breath sounds: Normal breath sounds.   Chest:      Chest wall: Tenderness present. No deformity, swelling, crepitus or edema.   Abdominal:      General: There is no distension.      Palpations: Abdomen is soft.      Tenderness: There is no abdominal tenderness. There is no right CVA tenderness, left CVA tenderness, guarding or rebound.   Musculoskeletal:         General: Normal range of motion.      Cervical back: Normal range of motion.   Skin:     General: Skin is warm and dry.      Capillary Refill: Capillary refill takes less than 2 seconds.      Findings: No rash.   Neurological:      General: No focal deficit present.      Mental Status: She is alert and oriented to person, place, and time.   Psychiatric:          Behavior: Behavior normal.       DIAGNOSTIC RESULTS     EKG: All EKG's are interpreted by the Emergency Department Physician who either signs or Co-signs this chart in the absence of a cardiologist.        RADIOLOGY:   Non-plain film images such as CT, Ultrasound and MRI are read by the radiologist. Plain radiographic images are visualized and preliminarily interpreted by the emergency physician with the below findings:      Interpretation per the Radiologist below, if available at the time of this note:    CT CHEST [place]   Final Result   Negative for trauma.   Configuration of the lungs is consistent with MAI infection, overall moderate    involvement. Follow-up outpatient referral to pulmonology recommended if not    already known.   Mild coronary calcification and ectasia of the ascending aorta.   Low-density somewhat atrophic thyroid, raising possibility for dysfunction.      CT Head W/O Contrast   Final Result   Normal CT head.      CT CERVICAL SPINE WO CONTRAST   Final Result   Negative for acute cervical spine injury. Multilevel DDD along the cervical    spine as above.            ED BEDSIDE ULTRASOUND:   Performed by ED Physician - none    LABS:  Labs Reviewed   CBC WITH AUTO DIFFERENTIAL   COMPREHENSIVE METABOLIC PANEL       All other labs were within normal range or not returned as of this dictation.    EMERGENCY DEPARTMENT COURSE and DIFFERENTIAL DIAGNOSIS/MDM:   Vitals:    Vitals:    10/24/21 1717  BP: (!) 171/89   Pulse: 56   Resp: 20   Temp: 97.1 F (36.2 C)   TempSrc: Oral   SpO2: 100%   Weight: 55.3 kg   Height: _0  (1.626 m)         Medical Decision Making  DDX includes but not limited to: Mechanical Fall, Closed head injury, Left rib fracture, Neck injury    76 yo F presents with mechanical fall playing pickleball hitting left forehead and ribs. Ambulatory since event without hip pain. Ambulated to bathroom in ED without difficult.   CT head, cspine, and chest ordered. No trauma seen  on Ct however incidentally MAC infection found. Consulted pulm Dr. Merril Abbe who states needs to meet with ID and pulmonology to get medication regimen and should not start meds from the ED today.   Laceration closed with 4 5-0 ethilon sutures. Patient lives in [place] so to follow up with her doctors there.     Amount and/or Complexity of Data Reviewed  Labs: ordered.  Radiology: ordered.    Risk  Prescription drug management.            REASSESSMENT        CONSULTS:  None    PROCEDURES:  Unless otherwise noted below, none     Lac Repair    Date/Time: 10/24/2021 7:00 PM  Performed by: Diamantina Monks, MD  Authorized by: Diamantina Monks, MD     Consent:     Consent obtained:  Verbal    Consent given by:  Patient    Risks discussed:  Infection, need for additional repair and poor cosmetic result  Universal protocol:     Patient identity confirmed:  Verbally with patient  Anesthesia:     Anesthesia method:  Local infiltration    Local anesthetic:  Lidocaine 1% WITH epi  Laceration details:     Location:  Face    Face location:  L eyebrow    Length (cm):  3  Pre-procedure details:     Preparation:  Patient was prepped and draped in usual sterile fashion  Exploration:     Limited defect created (wound extended): no      Hemostasis achieved with:  Direct pressure  Treatment:     Area cleansed with:  Chlorhexidine and saline    Amount of cleaning:  Standard    Debridement:  None    Undermining:  None  Skin repair:     Repair method:  Sutures    Suture size:  5-0    Suture material:  Nylon    Suture technique:  Simple interrupted    Number of sutures:  4  Approximation:     Approximation:  Close  Repair type:     Repair type:  Simple  Post-procedure details:     Dressing:  Open (no dressing)        FINAL IMPRESSION      1. Injury of head, initial encounter    2. Facial laceration, initial encounter    3. MAI (mycobacterium avium-intracellulare) infection ([place])    4. Rib pain on left side          DISPOSITION/PLAN    DISPOSITION Decision To Discharge 10/24/2021 06:47:53 PM      PATIENT REFERRED TO:  Infectious Disease, Pulmonology, PCP    Schedule an appointment as soon as possible for a visit   Call these doctors for follow up of MAC infection.      DISCHARGE MEDICATIONS:  New Prescriptions    No medications on file     Controlled Substances Monitoring:     No flowsheet data found.    (Please note that portions of this note were completed with a voice recognition program.  Efforts were made to edit the dictations but occasionally words are mis-transcribed.)    Diamantina Monks, MD (electronically signed)  Attending Emergency Physician            Diamantina Monks, MD  10/24/21 [phone]

## 2021-10-25 ENCOUNTER — Encounter: Payer: Self-pay | Admitting: Family Medicine

## 2021-10-27 NOTE — Progress Notes (Signed)
76 y.o. G50P2012 Widowed White or Caucasian Not Hispanic or Latino female here for annual exam.  She was on HRT, she has left over estrogel and has been   She is wondering about getting an Bone Density.    DEXA 06/2019. T-score -3.1 at left femoral neck. Overall improvement noted in BMD.    Had started alendronate in 2019, stopped it in 2021 secondary to reflux.   She has had life screening,   Patient's last menstrual period was 05/23/1998.          Sexually active: No.  The current method of family planning is post menopausal status.    Exercising: Yes.     Walking daily  Smoker:  no  Health Maintenance: Pap:  06/10/14 WNL (at 80);  12/21/10 WNL  History of abnormal Pap:  yes MMG:  07/15/20 Bi-rads 1 neg  BMD:   06/28/19 Osteoporosis, -2.6 Colonoscopy: 02/16/16 normal f/u 10 years  TDaP:  2010 Gardasil: n/a   reports that she quit smoking about 30 years ago. Her smoking use included cigarettes. She has never used smokeless tobacco. She reports current alcohol use. She reports that she does not use drugs. Son on the [place], has 2 kids. Daughter in [place], has one kid.   Past Medical History:  Diagnosis Date   Anxiety    Arthritis    in her hands    Elevated cholesterol    Hair loss    Hemorrhoids    Hyperlipidemia    Hypothyroidism    on synthroid   IBS (irritable bowel syndrome)    Lyme disease    Osteoporosis 03/2017   T score -3.2   Thyroid disease    hypothyroid    Past Surgical History:  Procedure Laterality Date   LAPAROSCOPIC SALPINGO OOPHERECTOMY Bilateral 10/29/2014   Procedure: LAPAROSCOPIC SALPINGO OOPHORECTOMY;  Surgeon: [name], MD;  Location: [facility];  Service: Gynecology;  Laterality: Bilateral;  1:00pm OR time requested  1 1/2 hours OR time requested.   SHOULDER SURGERY     TUBAL LIGATION      Current Outpatient Medications  Medication Sig Dispense Refill   diazepam (VALIUM) 5 MG tablet TAKE 1 TABLET BY MOUTH EVERYDAY AT BEDTIME 30  tablet 0   diphenhydrAMINE HCl (BENADRYL PO) Take by mouth.     estradiol (ESTRACE VAGINAL) 0.1 MG/GM vaginal cream Place 1 Applicatorful vaginally at bedtime. 42.5 g 12   levothyroxine (SYNTHROID) 75 MCG tablet Take 1 tablet (75 mcg total) by mouth daily before breakfast. 90 tablet 3   Methylcellulose, Laxative, (CITRUCEL PO) Take by mouth.     predniSONE (DELTASONE) 1 MG tablet Take 1 mg by mouth daily with breakfast.     triamcinolone cream (KENALOG) 0.1 % Apply 1 application topically 2 (two) times daily. 30 g 0   No current facility-administered medications for this visit.    Family History  Problem Relation Age of Onset   Alzheimer's disease Mother    Arthritis Mother    Osteoporosis Father    Cancer Father        leukemia   Arthritis Father    Hyperlipidemia Father    Cancer Sister        PERITONEAL CANCER   Arthritis Sister    Heart disease Sister    Arthritis Sister    Alcohol abuse Sister    Colitis Son    Anorexia nervosa Grandchild    Colon cancer Neg Hx    Stomach cancer Neg Hx  Esophageal cancer Neg Hx    Pancreatic cancer Neg Hx     Review of Systems  All other systems reviewed and are negative.   Exam:   BP 124/68   Pulse 77   Ht '5\' 4"'$  (1.626 m)   Wt 122 lb (55.3 kg)   LMP 05/23/1998   SpO2 100%   BMI 20.94 kg/m   Weight change: '@WEIGHTCHANGE'$ @ Height:   Height: '5\' 4"'$  (162.6 cm)  Ht Readings from Last 3 Encounters:  11/03/21 '5\' 4"'$  (1.626 m)  11/01/21 '5\' 4"'$  (1.626 m)  09/24/21 '5\' 4"'$  (1.626 m)    General appearance: alert, cooperative and appears stated age Head: Normocephalic, without obvious abnormality, atraumatic Neck: no adenopathy, supple, symmetrical, trachea midline and thyroid normal to inspection and palpation Lungs: clear to auscultation bilaterally Cardiovascular: regular rate and rhythm Breasts: normal appearance, no masses or tenderness Abdomen: soft, non-tender; non distended,  no masses,  no organomegaly Extremities:  extremities normal, atraumatic, no cyanosis or edema Skin: Skin color, texture, turgor normal. No rashes or lesions Lymph nodes: Cervical, supraclavicular, and axillary nodes normal. No abnormal inguinal nodes palpated Neurologic: Grossly normal   Pelvic: External genitalia:  no lesions              Urethra:  normal appearing urethra with no masses, tenderness or lesions              Bartholins and Skenes: normal                 Vagina: normal appearing vagina with normal color and discharge, no lesions              Cervix: no lesions               Bimanual Exam:  Uterus:  normal size, contour, position, consistency, mobility, non-tender              Adnexa: no mass, fullness, tenderness               Rectovaginal: Confirms               Anus:  normal sphincter tone, no lesions  Gae Dry chaperoned for the exam.  1. Encounter for breast and pelvic examination Mammogram due, she will schedule No pap this year Labs with her primary  2. Osteoporosis without current pathological fracture, unspecified osteoporosis type DEXA due, order placed.  Discussed calcium and vit d use Discussed weight bearing exercise  3. Hormone replacement therapy (HRT) We discussed the risks, she wants to continue. We discussed the importance of endometrial protection. She has been using her left over estrogel 3 x a week without progesterone. Discussed the need for endometrial evaluation - Estradiol (DIVIGEL) 0.25 MG/0.25GM GEL; Place topically on the lower abdomen daily  Dispense: 30 each; Refill: 1 - progesterone (PROMETRIUM) 100 MG capsule; Take 1 capsule (100 mg total) by mouth daily.  Dispense: 30 capsule; Refill: 1  4. Estrogen excess On unopposed estrogen - US PELVIS TRANSVAGINAL NON-OB (TV ONLY); Future -If her lining is over 4 mm, will recommend biopsy  5. BMI 20.0-20.9, adult - DG Bone Density; Future  6. Long term systemic steroid user - DG Bone Density; Future

## 2021-11-01 ENCOUNTER — Ambulatory Visit (INDEPENDENT_AMBULATORY_CARE_PROVIDER_SITE_OTHER): Payer: Medicare Other | Admitting: Family Medicine

## 2021-11-01 VITALS — BP 118/70 | HR 68 | Temp 98.0°F | Ht 64.0 in | Wt 124.6 lb

## 2021-11-01 DIAGNOSIS — R918 Other nonspecific abnormal finding of lung field: Secondary | ICD-10-CM | POA: Diagnosis not present

## 2021-11-01 NOTE — Progress Notes (Signed)
Subjective:    Patient ID: Annette Barker, female    DOB: [birth year], 76 y.o.   MRN: [id number]  HPI  Patient recently fell while playing pickle ball and suffered a laceration above her left orbit.  This was closed with 4, 4-0 Ethilon sutures.  She is here today to have the sutures removed.  However she was seen at the emergency room for this.  They performed a CT scan of the head that was negative they also performed a CT scan of the chest that showed biapical tree-in-bud nodularity throughout the lungs concerning for Mycobacterium avium complex infection.  Patient was warned about this extensively in the emergency room and is here today concerned that she has a life-threatening communicable disease.  At the present time she is asymptomatic.  She denies any chest pain shortness of breath.  She denies any hemoptysis.  She denies any fevers chills or night sweats.  I reviewed previous imaging.  Apparently in 2015 she had a CT scan of the abdomen and pelvis.  There was also apparent nodularity seen in the lung bases that was not conveyed to the patient at that time.  Apparently this was done in the emergency room.  Therefore this appears to be present in the lungs for at least 8 years and has been asymptomatic.  A chest x-ray obtained in 2021 also showed benign nodularity that was nonspecific.  Therefore I feel that this is likely been present for quite some time and was undiagnosed.  The patient has been asymptomatic throughout Past Medical History:  Diagnosis Date   Anxiety    Arthritis    in her hands    Elevated cholesterol    Hair loss    Hemorrhoids    Hyperlipidemia    Hypothyroidism    on synthroid   IBS (irritable bowel syndrome)    Lyme disease    Osteoporosis 03/2017   T score -3.2   Thyroid disease    hypothyroid   Past Surgical History:  Procedure Laterality Date   LAPAROSCOPIC SALPINGO OOPHERECTOMY Bilateral 10/29/2014   Procedure: LAPAROSCOPIC SALPINGO OOPHORECTOMY;   Surgeon: [name], MD;  Location: [facility];  Service: Gynecology;  Laterality: Bilateral;  1:00pm OR time requested  1 1/2 hours OR time requested.   SHOULDER SURGERY     TUBAL LIGATION     Current Outpatient Medications on File Prior to Visit  Medication Sig Dispense Refill   diazepam (VALIUM) 5 MG tablet TAKE 1 TABLET BY MOUTH EVERYDAY AT BEDTIME 30 tablet 0   diphenhydrAMINE HCl (BENADRYL PO) Take by mouth.     estradiol (ESTRACE VAGINAL) 0.1 MG/GM vaginal cream Place 1 Applicatorful vaginally at bedtime. 42.5 g 12   levothyroxine (SYNTHROID) 75 MCG tablet Take 1 tablet (75 mcg total) by mouth daily before breakfast. 90 tablet 3   predniSONE (DELTASONE) 1 MG tablet Take 1 mg by mouth daily with breakfast.     triamcinolone cream (KENALOG) 0.1 % Apply 1 application topically 2 (two) times daily. 30 g 0   No current facility-administered medications on file prior to visit.   Allergies  Allergen Reactions   Hydromet [Hydrocodone Bit-Homatrop Mbr] Itching and Swelling   Codeine Swelling   Ibuprofen     Other reaction(s): reflux   Statins Other (See Comments)    Body cramps all over body   Social History   Socioeconomic History   Marital status: Widowed    Spouse name: Not on file   Number of  children: Not on file   Years of education: Not on file   Highest education level: Not on file  Occupational History   Not on file  Tobacco Use   Smoking status: Former    Types: Cigarettes    Quit date: 05/27/1991    Years since quitting: 30.4   Smokeless tobacco: Never  Vaping Use   Vaping Use: Never used  Substance and Sexual Activity   Alcohol use: Yes    Alcohol/week: 0.0 standard drinks of alcohol    Comment: rare   Drug use: No   Sexual activity: Not Currently    Birth control/protection: Post-menopausal    Comment: 1st intercourse 76 yo--Fewer than 5 partners  Other Topics Concern   Not on file  Social History Narrative   Not on file   Social Determinants of  Health   Financial Resource Strain: Low Risk  (10/29/2020)   Overall Financial Resource Strain (CARDIA)    Difficulty of Paying Living Expenses: Not hard at all  Food Insecurity: No Food Insecurity (10/29/2020)   Hunger Vital Sign    Worried About Running Out of Food in the Last Year: Never true    Ran Out of Food in the Last Year: Never true  Transportation Needs: No Transportation Needs (10/29/2020)   PRAPARE - Hydrologist (Medical): No    Lack of Transportation (Non-Medical): No  Physical Activity: Sufficiently Active (10/29/2020)   Exercise Vital Sign    Days of Exercise per Week: 7 days    Minutes of Exercise per Session: 30 min  Stress: No Stress Concern Present (10/29/2020)   [place]    Feeling of Stress : Not at all  Social Connections: Moderately Isolated (10/29/2020)   Social Connection and Isolation Panel [NHANES]    Frequency of Communication with Friends and Family: More than three times a week    Frequency of Social Gatherings with Friends and Family: More than three times a week    Attends Religious Services: Never    Marine scientist or Organizations: No    Attends Archivist Meetings: Never    Marital Status: Living with partner  Intimate Partner Violence: Not At Risk (10/29/2020)   Humiliation, Afraid, Rape, and Kick questionnaire    Fear of Current or Ex-Partner: No    Emotionally Abused: No    Physically Abused: No    Sexually Abused: No      Review of Systems  All other systems reviewed and are negative.      Objective:   Physical Exam Vitals reviewed.  Constitutional:      General: She is not in acute distress.    Appearance: Normal appearance. She is not ill-appearing, toxic-appearing or diaphoretic.  HENT:     Head: Normocephalic and atraumatic.  Eyes:     Extraocular Movements: Extraocular movements intact.     Conjunctiva/sclera:  Conjunctivae normal.     Pupils: Pupils are equal, round, and reactive to light.   Cardiovascular:     Rate and Rhythm: Normal rate and regular rhythm.     Pulses: Normal pulses.     Heart sounds: Normal heart sounds. No murmur heard.    No friction rub. No gallop.  Pulmonary:     Effort: Pulmonary effort is normal. No respiratory distress.     Breath sounds: Normal breath sounds. No stridor. No wheezing, rhonchi or rales.  Chest:     Chest  wall: No tenderness.  Musculoskeletal:     Right foot: Normal range of motion. No deformity, bunion, Charcot foot or prominent metatarsal heads.     Left foot: Normal range of motion. No deformity, bunion, Charcot foot or prominent metatarsal heads.  Feet:     Right foot:     Skin integrity: Skin integrity normal.     Left foot:     Skin integrity: Skin integrity normal.  Neurological:     General: No focal deficit present.     Mental Status: She is alert and oriented to person, place, and time. Mental status is at baseline.     Motor: No weakness.     Coordination: Coordination normal.           Assessment & Plan:  Multiple pulmonary nodules determined by computed tomography of lung I removed the stitches without difficulty.  The laceration above her left eye has healed well without complication.  I reviewed the patient's CT report from the outside hospital as well as the CT of the abdomen from 2015 and the chest x-ray from 2021.  I believe that there has been nodularity seen essentially for the last 8 years with no symptoms.  Specifically the patient denies fevers chills cough hemoptysis or weight loss.  Therefore I believe that this is most likely an indolent infection that does not require treatment.  I explained this to the patient.  I will consult pulmonology to see if she would benefit from bronchoscopy and lavage with cultures.  May consult infectious disease based on the results

## 2021-11-03 ENCOUNTER — Encounter: Payer: Self-pay | Admitting: Obstetrics and Gynecology

## 2021-11-03 ENCOUNTER — Ambulatory Visit (INDEPENDENT_AMBULATORY_CARE_PROVIDER_SITE_OTHER): Payer: Medicare Other | Admitting: Obstetrics and Gynecology

## 2021-11-03 VITALS — BP 124/68 | HR 77 | Ht 64.0 in | Wt 122.0 lb

## 2021-11-03 DIAGNOSIS — Z7952 Long term (current) use of systemic steroids: Secondary | ICD-10-CM

## 2021-11-03 DIAGNOSIS — Z6820 Body mass index (BMI) 20.0-20.9, adult: Secondary | ICD-10-CM

## 2021-11-03 DIAGNOSIS — E280 Estrogen excess: Secondary | ICD-10-CM

## 2021-11-03 DIAGNOSIS — Z01419 Encounter for gynecological examination (general) (routine) without abnormal findings: Secondary | ICD-10-CM | POA: Diagnosis not present

## 2021-11-03 DIAGNOSIS — Z79890 Hormone replacement therapy (postmenopausal): Secondary | ICD-10-CM | POA: Diagnosis not present

## 2021-11-03 DIAGNOSIS — M810 Age-related osteoporosis without current pathological fracture: Secondary | ICD-10-CM

## 2021-11-03 MED ORDER — PROGESTERONE MICRONIZED 100 MG PO CAPS: 100.0000 mg | ORAL_CAPSULE | Freq: Every day | ORAL | 1 refills | Status: DC

## 2021-11-03 MED ORDER — ESTRADIOL 0.25 MG/0.25GM TD GEL: TRANSDERMAL | 1 refills | Status: DC

## 2021-11-03 NOTE — Patient Instructions (Signed)

## 2021-11-10 DIAGNOSIS — M62838 Other muscle spasm: Secondary | ICD-10-CM | POA: Diagnosis not present

## 2021-11-10 DIAGNOSIS — M6289 Other specified disorders of muscle: Secondary | ICD-10-CM | POA: Diagnosis not present

## 2021-11-10 DIAGNOSIS — M6281 Muscle weakness (generalized): Secondary | ICD-10-CM | POA: Diagnosis not present

## 2021-11-10 DIAGNOSIS — R152 Fecal urgency: Secondary | ICD-10-CM | POA: Diagnosis not present

## 2021-11-10 DIAGNOSIS — R159 Full incontinence of feces: Secondary | ICD-10-CM | POA: Diagnosis not present

## 2021-11-25 ENCOUNTER — Ambulatory Visit (INDEPENDENT_AMBULATORY_CARE_PROVIDER_SITE_OTHER): Payer: Medicare Other

## 2021-11-25 DIAGNOSIS — E280 Estrogen excess: Secondary | ICD-10-CM | POA: Diagnosis not present

## 2021-11-29 ENCOUNTER — Encounter: Payer: Self-pay | Admitting: Pulmonary Disease

## 2021-11-29 ENCOUNTER — Ambulatory Visit (INDEPENDENT_AMBULATORY_CARE_PROVIDER_SITE_OTHER): Payer: Medicare Other | Admitting: Pulmonary Disease

## 2021-11-29 VITALS — BP 124/72 | HR 55 | Temp 98.3°F | Ht 64.0 in | Wt 122.6 lb

## 2021-11-29 DIAGNOSIS — A310 Pulmonary mycobacterial infection: Secondary | ICD-10-CM

## 2021-11-29 DIAGNOSIS — J479 Bronchiectasis, uncomplicated: Secondary | ICD-10-CM

## 2021-11-29 NOTE — Patient Instructions (Addendum)
Thank you for visiting Dr. Valeta Harms at [facility]. Today we recommend the following:  Repeat CT Chest in 2 years   Return in about 2 years (around 11/30/2023).    Please do your part to reduce the spread of COVID-19.

## 2021-11-29 NOTE — Addendum Note (Signed)
Addended by: Gavin Potters R on: 11/29/2021 11:41 AM   Modules accepted: Orders

## 2021-11-29 NOTE — Progress Notes (Signed)
Synopsis: Referred in July 2023 for lung nodules by Susy Frizzle, MD  Subjective:   PATIENT ID: Annette Barker GENDER: female DOB: 29-Nov-1945, MRN: [id number]  Chief Complaint  Patient presents with   Consult    Review of CT scan     This is a 76 year old female, past medical history of anxiety, hypertension, hype per lipidemia, hypothyroidism no family history of lung cancer patient is a former smoker quit in 1993.Patient had a CT scan of the chest completed at [facility], CT report able to review in epic care everywhere patient was found to have bilateral scattered nodularity within the chest evidence of bronchiectasis reticulation greatest involvement within the lingula and the right middle lobe also mild coronary calcifications.  Lung constellation of findings consistent with MAI.  Patient only has a mild cough.  Denies sputum production or weight loss and fatigue.  Patient worked her life as an Futures trader.    Her nieces and nephews are of the [employer] family and are from [place] [employer].    Past Medical History:  Diagnosis Date   Anxiety    Arthritis    in her hands    Elevated cholesterol    Hair loss    Hemorrhoids    Hyperlipidemia    Hypothyroidism    on synthroid   IBS (irritable bowel syndrome)    Lyme disease    Osteoporosis 03/2017   T score -3.2   Thyroid disease    hypothyroid     Family History  Problem Relation Age of Onset   Alzheimer's disease Mother    Arthritis Mother    Osteoporosis Father    Cancer Father        leukemia   Arthritis Father    Hyperlipidemia Father    Cancer Sister        PERITONEAL CANCER   Arthritis Sister    Heart disease Sister    Arthritis Sister    Alcohol abuse Sister    Colitis Son    Anorexia nervosa Grandchild    Colon cancer Neg Hx    Stomach cancer Neg Hx    Esophageal cancer Neg Hx    Pancreatic cancer Neg Hx      Past Surgical History:  Procedure Laterality Date    LAPAROSCOPIC SALPINGO OOPHERECTOMY Bilateral 10/29/2014   Procedure: LAPAROSCOPIC SALPINGO OOPHORECTOMY;  Surgeon: [name], MD;  Location: [facility];  Service: Gynecology;  Laterality: Bilateral;  1:00pm OR time requested  1 1/2 hours OR time requested.   SHOULDER SURGERY     TUBAL LIGATION      Social History   Socioeconomic History   Marital status: Widowed    Spouse name: Not on file   Number of children: Not on file   Years of education: Not on file   Highest education level: Not on file  Occupational History   Not on file  Tobacco Use   Smoking status: Former    Types: Cigarettes    Quit date: 05/27/1991    Years since quitting: 30.5   Smokeless tobacco: Never  Vaping Use   Vaping Use: Never used  Substance and Sexual Activity   Alcohol use: Yes    Alcohol/week: 0.0 standard drinks of alcohol    Comment: rare   Drug use: No   Sexual activity: Not Currently    Birth control/protection: Post-menopausal    Comment: 1st intercourse 76 yo--Fewer than 5 partners  Other Topics Concern   Not  on file  Social History Narrative   Not on file   Social Determinants of Health   Financial Resource Strain: Low Risk  (10/29/2020)   Overall Financial Resource Strain (CARDIA)    Difficulty of Paying Living Expenses: Not hard at all  Food Insecurity: No Food Insecurity (10/29/2020)   Hunger Vital Sign    Worried About Running Out of Food in the Last Year: Never true    Ran Out of Food in the Last Year: Never true  Transportation Needs: No Transportation Needs (10/29/2020)   PRAPARE - Hydrologist (Medical): No    Lack of Transportation (Non-Medical): No  Physical Activity: Sufficiently Active (10/29/2020)   Exercise Vital Sign    Days of Exercise per Week: 7 days    Minutes of Exercise per Session: 30 min  Stress: No Stress Concern Present (10/29/2020)   [place]    Feeling of Stress :  Not at all  Social Connections: Moderately Isolated (10/29/2020)   Social Connection and Isolation Panel [NHANES]    Frequency of Communication with Friends and Family: More than three times a week    Frequency of Social Gatherings with Friends and Family: More than three times a week    Attends Religious Services: Never    Marine scientist or Organizations: No    Attends Archivist Meetings: Never    Marital Status: Living with partner  Intimate Partner Violence: Not At Risk (10/29/2020)   Humiliation, Afraid, Rape, and Kick questionnaire    Fear of Current or Ex-Partner: No    Emotionally Abused: No    Physically Abused: No    Sexually Abused: No     Allergies  Allergen Reactions   Hydromet [Hydrocodone Bit-Homatrop Mbr] Itching and Swelling   Codeine Swelling   Ibuprofen     Other reaction(s): reflux   Statins Other (See Comments)    Body cramps all over body     Outpatient Medications Prior to Visit  Medication Sig Dispense Refill   diazepam (VALIUM) 5 MG tablet TAKE 1 TABLET BY MOUTH EVERYDAY AT BEDTIME 30 tablet 0   diphenhydrAMINE HCl (BENADRYL PO) Take by mouth.     Estradiol (DIVIGEL) 0.25 MG/0.25GM GEL Place topically on the lower abdomen daily 30 each 1   levothyroxine (SYNTHROID) 75 MCG tablet Take 1 tablet (75 mcg total) by mouth daily before breakfast. 90 tablet 3   Methylcellulose, Laxative, (CITRUCEL PO) Take by mouth.     predniSONE (DELTASONE) 1 MG tablet Take 1 mg by mouth daily with breakfast.     progesterone (PROMETRIUM) 100 MG capsule Take 1 capsule (100 mg total) by mouth daily. 30 capsule 1   triamcinolone cream (KENALOG) 0.1 % Apply 1 application topically 2 (two) times daily. 30 g 0   No facility-administered medications prior to visit.    Review of Systems  Constitutional:  Negative for chills, fever, malaise/fatigue and weight loss.  HENT:  Negative for hearing loss, sore throat and tinnitus.   Eyes:  Negative for blurred vision and  double vision.  Respiratory:  Positive for cough. Negative for hemoptysis, sputum production, shortness of breath, wheezing and stridor.   Cardiovascular:  Negative for chest pain, palpitations, orthopnea, leg swelling and PND.  Gastrointestinal:  Negative for abdominal pain, constipation, diarrhea, heartburn, nausea and vomiting.  Genitourinary:  Negative for dysuria, hematuria and urgency.  Musculoskeletal:  Negative for joint pain and myalgias.  Skin:  Negative for itching and rash.  Neurological:  Negative for dizziness, tingling, weakness and headaches.  Endo/Heme/Allergies:  Negative for environmental allergies. Does not bruise/bleed easily.  Psychiatric/Behavioral:  Negative for depression. The patient is not nervous/anxious and does not have insomnia.   All other systems reviewed and are negative.    Objective:  Physical Exam Vitals reviewed.  Constitutional:      General: She is not in acute distress.    Appearance: She is well-developed.  HENT:     Head: Normocephalic and atraumatic.  Eyes:     General: No scleral icterus.    Conjunctiva/sclera: Conjunctivae normal.     Pupils: Pupils are equal, round, and reactive to light.  Neck:     Vascular: No JVD.     Trachea: No tracheal deviation.  Cardiovascular:     Rate and Rhythm: Normal rate and regular rhythm.     Heart sounds: Normal heart sounds. No murmur heard. Pulmonary:     Effort: Pulmonary effort is normal. No tachypnea, accessory muscle usage or respiratory distress.     Breath sounds: No stridor. No wheezing, rhonchi or rales.  Abdominal:     General: There is no distension.     Palpations: Abdomen is soft.     Tenderness: There is no abdominal tenderness.  Musculoskeletal:        General: No tenderness.     Cervical back: Neck supple.  Lymphadenopathy:     Cervical: No cervical adenopathy.  Skin:    General: Skin is warm and dry.     Capillary Refill: Capillary refill takes less than 2 seconds.      Findings: No rash.  Neurological:     Mental Status: She is alert and oriented to person, place, and time.  Psychiatric:        Behavior: Behavior normal.      Vitals:   11/29/21 0940  BP: 124/72  Pulse: (!) 55  Temp: 98.3 F (36.8 C)  TempSrc: Oral  SpO2: 98%  Weight: 122 lb 9.6 oz (55.6 kg)  Height: '5\' 4"'$  (1.626 m)   98% on RA BMI Readings from Last 3 Encounters:  11/29/21 21.04 kg/m  11/03/21 20.94 kg/m  11/01/21 21.39 kg/m   Wt Readings from Last 3 Encounters:  11/29/21 122 lb 9.6 oz (55.6 kg)  11/03/21 122 lb (55.3 kg)  11/01/21 124 lb 9.6 oz (56.5 kg)     CBC    Component Value Date/Time   WBC 6.2 03/26/2021 1612   RBC 3.55 (L) 03/26/2021 1612   HGB 11.7 03/26/2021 1612   HCT 34.8 (L) 03/26/2021 1612   PLT 300 03/26/2021 1612   MCV 98.0 03/26/2021 1612   MCH 33.0 03/26/2021 1612   MCHC 33.6 03/26/2021 1612   RDW 12.4 03/26/2021 1612   LYMPHSABS 1,885 03/26/2021 1612   MONOABS 910 01/09/2017 1516   EOSABS 211 03/26/2021 1612   BASOSABS 62 03/26/2021 1612     Chest Imaging: CT chest reviewed from [facility] in [place]: Imaging report available in epic care everywhere.  No images available for review. Evidence of lingular and right middle lobe bronchiectasis and nodularity consistent with MAI. The patient's images have been independently reviewed by me.    Pulmonary Functions Testing Results:     No data to display          FeNO:   Pathology:   Echocardiogram:   Heart Catheterization:     Assessment & Plan:     ICD-10-CM  1. MAI (mycobacterium avium-intracellulare) (HCC)  A31.0 CT CHEST WO CONTRAST    2. Bronchiectasis without complication ([place])  H96.2       Discussion:  This is a 76 year old female seen today for abnormal CT imaging with right middle lobe and lingular evidence of MAI, bronchiectasis and nodularity.  The patient has no real significant symptoms.  She does have a mild cough that has been  persistent for some time, no sputum production no weight loss no hemoptysis.  Plan: She likely does have MAI based on image report.  I am unable to view the images myself.  We do not have a copy of the disc of the images. However for regular MAI changes on the lung we would recommend CT image follow-up 12 to 24 months. If there are any progression of disease of the lung would consider cultures either via sputum induced or bronchoscopy.  We would only do this if she was going to be interested in treatment.  We talked briefly about the treatment options. She does not have any symptoms right now that would warrant treatment. I think we continue with observation. If symptoms change and she develops sputum production, hemoptysis or weight loss then she would definitely qualify for treatment. I will give her a cup today for sputum culture for AFB if she does develop sputum in the next coming weeks to months that we could send off to the lab to see if she has any AFB present. Otherwise can follow-up with [place] in 2 years.  Would consider repeat CT scan of the chest in 2 years.  We appreciate consultation   Current Outpatient Medications:    diazepam (VALIUM) 5 MG tablet, TAKE 1 TABLET BY MOUTH EVERYDAY AT BEDTIME, Disp: 30 tablet, Rfl: 0   diphenhydrAMINE HCl (BENADRYL PO), Take by mouth., Disp: , Rfl:    Estradiol (DIVIGEL) 0.25 MG/0.25GM GEL, Place topically on the lower abdomen daily, Disp: 30 each, Rfl: 1   levothyroxine (SYNTHROID) 75 MCG tablet, Take 1 tablet (75 mcg total) by mouth daily before breakfast., Disp: 90 tablet, Rfl: 3   Methylcellulose, Laxative, (CITRUCEL PO), Take by mouth., Disp: , Rfl:    predniSONE (DELTASONE) 1 MG tablet, Take 1 mg by mouth daily with breakfast., Disp: , Rfl:    progesterone (PROMETRIUM) 100 MG capsule, Take 1 capsule (100 mg total) by mouth daily., Disp: 30 capsule, Rfl: 1   triamcinolone cream (KENALOG) 0.1 %, Apply 1 application topically 2 (two) times  daily., Disp: 30 g, Rfl: 0  I spent 45 minutes dedicated to the care of this patient on the date of this encounter to include pre-visit review of records, face-to-face time with the patient discussing conditions above, post visit ordering of testing, clinical documentation with the electronic health record, making appropriate referrals as documented, and communicating necessary findings to members of the patients care team.   Garner Nash, DO [facility] 11/29/2021 10:06 AM

## 2021-12-02 ENCOUNTER — Telehealth: Payer: Self-pay | Admitting: Obstetrics and Gynecology

## 2021-12-02 NOTE — Telephone Encounter (Signed)
Please let the patient know that I have reviewed her ultrasound images and everything is reassuring. If she has any vaginal bleeding she needs further evaluation.

## 2021-12-03 ENCOUNTER — Other Ambulatory Visit: Payer: Self-pay | Admitting: Obstetrics and Gynecology

## 2021-12-03 DIAGNOSIS — Z79890 Hormone replacement therapy (postmenopausal): Secondary | ICD-10-CM

## 2021-12-14 ENCOUNTER — Ambulatory Visit (INDEPENDENT_AMBULATORY_CARE_PROVIDER_SITE_OTHER): Payer: Medicare Other

## 2021-12-14 ENCOUNTER — Other Ambulatory Visit: Payer: Self-pay | Admitting: Obstetrics and Gynecology

## 2021-12-14 DIAGNOSIS — Z7952 Long term (current) use of systemic steroids: Secondary | ICD-10-CM

## 2021-12-14 DIAGNOSIS — M810 Age-related osteoporosis without current pathological fracture: Secondary | ICD-10-CM

## 2021-12-14 DIAGNOSIS — Z6820 Body mass index (BMI) 20.0-20.9, adult: Secondary | ICD-10-CM

## 2021-12-14 DIAGNOSIS — Z780 Asymptomatic menopausal state: Secondary | ICD-10-CM

## 2021-12-14 DIAGNOSIS — Z13820 Encounter for screening for osteoporosis: Secondary | ICD-10-CM

## 2021-12-15 DIAGNOSIS — R152 Fecal urgency: Secondary | ICD-10-CM | POA: Diagnosis not present

## 2021-12-15 DIAGNOSIS — M62838 Other muscle spasm: Secondary | ICD-10-CM | POA: Diagnosis not present

## 2021-12-15 DIAGNOSIS — M6289 Other specified disorders of muscle: Secondary | ICD-10-CM | POA: Diagnosis not present

## 2021-12-15 DIAGNOSIS — R159 Full incontinence of feces: Secondary | ICD-10-CM | POA: Diagnosis not present

## 2021-12-15 DIAGNOSIS — M6281 Muscle weakness (generalized): Secondary | ICD-10-CM | POA: Diagnosis not present

## 2021-12-20 DIAGNOSIS — Z7952 Long term (current) use of systemic steroids: Secondary | ICD-10-CM | POA: Diagnosis not present

## 2021-12-20 DIAGNOSIS — M353 Polymyalgia rheumatica: Secondary | ICD-10-CM | POA: Diagnosis not present

## 2021-12-20 DIAGNOSIS — Z6821 Body mass index (BMI) 21.0-21.9, adult: Secondary | ICD-10-CM | POA: Diagnosis not present

## 2021-12-20 NOTE — Telephone Encounter (Signed)
My chart message came back unread. I called patient and informed her of the below.

## 2021-12-29 ENCOUNTER — Encounter: Payer: Self-pay | Admitting: Obstetrics and Gynecology

## 2021-12-29 ENCOUNTER — Ambulatory Visit (INDEPENDENT_AMBULATORY_CARE_PROVIDER_SITE_OTHER): Payer: Medicare Other | Admitting: Obstetrics and Gynecology

## 2021-12-29 VITALS — BP 108/62 | HR 64 | Wt 122.0 lb

## 2021-12-29 DIAGNOSIS — M810 Age-related osteoporosis without current pathological fracture: Secondary | ICD-10-CM | POA: Diagnosis not present

## 2021-12-29 MED ORDER — ALENDRONATE SODIUM 70 MG PO TABS: 70.0000 mg | ORAL_TABLET | ORAL | 3 refills | Status: DC

## 2021-12-29 NOTE — Progress Notes (Signed)
GYNECOLOGY  VISIT   HPI: 76 y.o.   Widowed White or Caucasian Not Hispanic or Latino  female   [phone] with Patient's last menstrual period was 05/23/1998.   here for a consult about bone density scan   DEXA from 11/18 T-score -3.2 at left femoral neck. She started alendronate in 2019, stopped it in 2021 primary told her to stop.    DEXA 06/2019. T score -2.6  DEXA 12/14/21 T score -3.0 in her left femoral neck  She is on low dose hrt and understands the risks.   She is very active, walking all the time.   She is getting at least 2 servings a day of calcium in her diet.   No tobacco. Just occasional ETOH.   No major dental procedures are scheduled.    GYNECOLOGIC HISTORY: Patient's last menstrual period was 05/23/1998. Contraception:pmp  Menopausal hormone therapy: estradiol         OB History     Gravida  3   Para  2   Term  2   Preterm      AB  1   Living  2      SAB  1   IAB      Ectopic      Multiple      Live Births                 Patient Active Problem List   Diagnosis Date Noted   Inflammatory polyarthropathy ([place]) 05/13/2021   Iron deficiency anemia 05/13/2021   Long term systemic steroid user 05/13/2021   Polymyalgia rheumatica ([place]) 05/13/2021   Hyperlipidemia    Anxiety    Elevated cholesterol    Osteoporosis 03/24/2015   Left ovarian cyst 09/24/2014   Osteopenia    Vaginal dryness    Atrophic vaginitis    IBS (irritable bowel syndrome)    Thyroid disease    Chest pain 05/08/2010   EDEMA 04/20/2010    Past Medical History:  Diagnosis Date   Anxiety    Arthritis    in her hands    Elevated cholesterol    Hair loss    Hemorrhoids    Hyperlipidemia    Hypothyroidism    on synthroid   IBS (irritable bowel syndrome)    Lyme disease    Osteoporosis 03/2017   T score -3.2   Thyroid disease    hypothyroid    Past Surgical History:  Procedure Laterality Date   LAPAROSCOPIC SALPINGO OOPHERECTOMY Bilateral 10/29/2014    Procedure: LAPAROSCOPIC SALPINGO OOPHORECTOMY;  Surgeon: [name], MD;  Location: [facility];  Service: Gynecology;  Laterality: Bilateral;  1:00pm OR time requested  1 1/2 hours OR time requested.   SHOULDER SURGERY     TUBAL LIGATION      Current Outpatient Medications  Medication Sig Dispense Refill   B Complex Vitamins (VITAMIN B COMPLEX PO) Take by mouth.     diazepam (VALIUM) 5 MG tablet TAKE 1 TABLET BY MOUTH EVERYDAY AT BEDTIME 30 tablet 0   diphenhydrAMINE HCl (BENADRYL PO) Take by mouth.     Estradiol (DIVIGEL) 0.25 MG/0.25GM GEL Place topically on the lower abdomen daily 30 each 1   levothyroxine (SYNTHROID) 75 MCG tablet Take 1 tablet (75 mcg total) by mouth daily before breakfast. 90 tablet 3   Methylcellulose, Laxative, (CITRUCEL PO) Take by mouth.     predniSONE (DELTASONE) 1 MG tablet Take 1 mg by mouth daily with breakfast.     progesterone (  PROMETRIUM) 100 MG capsule Take 1 capsule (100 mg total) by mouth daily. 30 capsule 1   triamcinolone cream (KENALOG) 0.1 % Apply 1 application topically 2 (two) times daily. 30 g 0   No current facility-administered medications for this visit.     ALLERGIES: Hydromet [hydrocodone bit-homatrop mbr], Codeine, Ibuprofen, and Statins  Family History  Problem Relation Age of Onset   Alzheimer's disease Mother    Arthritis Mother    Osteoporosis Father    Cancer Father        leukemia   Arthritis Father    Hyperlipidemia Father    Cancer Sister        PERITONEAL CANCER   Arthritis Sister    Heart disease Sister    Arthritis Sister    Alcohol abuse Sister    Colitis Son    Anorexia nervosa Grandchild    Colon cancer Neg Hx    Stomach cancer Neg Hx    Esophageal cancer Neg Hx    Pancreatic cancer Neg Hx     Social History   Socioeconomic History   Marital status: Widowed    Spouse name: Not on file   Number of children: Not on file   Years of education: Not on file   Highest education level: Not on file   Occupational History   Not on file  Tobacco Use   Smoking status: Former    Types: Cigarettes    Quit date: 05/27/1991    Years since quitting: 30.6   Smokeless tobacco: Never  Vaping Use   Vaping Use: Never used  Substance and Sexual Activity   Alcohol use: Yes    Alcohol/week: 0.0 standard drinks of alcohol    Comment: rare   Drug use: No   Sexual activity: Not Currently    Birth control/protection: Post-menopausal    Comment: 1st intercourse 76 yo--Fewer than 5 partners  Other Topics Concern   Not on file  Social History Narrative   Not on file   Social Determinants of Health   Financial Resource Strain: Low Risk  (10/29/2020)   Overall Financial Resource Strain (CARDIA)    Difficulty of Paying Living Expenses: Not hard at all  Food Insecurity: No Food Insecurity (10/29/2020)   Hunger Vital Sign    Worried About Running Out of Food in the Last Year: Never true    Ran Out of Food in the Last Year: Never true  Transportation Needs: No Transportation Needs (10/29/2020)   PRAPARE - Hydrologist (Medical): No    Lack of Transportation (Non-Medical): No  Physical Activity: Sufficiently Active (10/29/2020)   Exercise Vital Sign    Days of Exercise per Week: 7 days    Minutes of Exercise per Session: 30 min  Stress: No Stress Concern Present (10/29/2020)   [place]    Feeling of Stress : Not at all  Social Connections: Moderately Isolated (10/29/2020)   Social Connection and Isolation Panel [NHANES]    Frequency of Communication with Friends and Family: More than three times a week    Frequency of Social Gatherings with Friends and Family: More than three times a week    Attends Religious Services: Never    Marine scientist or Organizations: No    Attends Archivist Meetings: Never    Marital Status: Living with partner  Intimate Partner Violence: Not At Risk (10/29/2020)    Humiliation, Afraid, Rape, and Kick  questionnaire    Fear of Current or Ex-Partner: No    Emotionally Abused: No    Physically Abused: No    Sexually Abused: No    Review of Systems  All other systems reviewed and are negative.   PHYSICAL EXAMINATION:    BP 108/62   Pulse 64   Wt 122 lb (55.3 kg)   LMP 05/23/1998   SpO2 100%   BMI 20.94 kg/m     General appearance: alert, cooperative and appears stated age  64. Osteoporosis without current pathological fracture, unspecified osteoporosis type -Discussed getting 1,200 mg a day of calcium and least 400 IU a day of vit D -Discussed weight bearing exercise and fall prevention - Comprehensive metabolic panel; Future - VITAMIN D 25 Hydroxy (Vit-D Deficiency, Fractures); Future - Magnesium; Future - Phosphorus; Future -We discussed medication, I recommended she restart on fosamax (once labs are back). Reviewed the risks of esophageal irritation, myalgias and the rare side effects of osteonecrosis of the jaw and atypical fractures - alendronate (FOSAMAX) 70 MG tablet; Take 1 tablet (70 mg total) by mouth every 7 (seven) days. Take first thing in am with 6 oz. Water.  Be upright after taking.  Eat nothing for one hour.  Dispense: 12 tablet; Refill: 3 -Reading information on osteoporosis was given from [facility] -F/U DEXA in 2 years

## 2022-01-03 ENCOUNTER — Encounter: Payer: Self-pay | Admitting: Family Medicine

## 2022-01-03 ENCOUNTER — Other Ambulatory Visit: Payer: Medicare Other

## 2022-01-03 DIAGNOSIS — Z79899 Other long term (current) drug therapy: Secondary | ICD-10-CM | POA: Diagnosis not present

## 2022-01-03 DIAGNOSIS — E039 Hypothyroidism, unspecified: Secondary | ICD-10-CM | POA: Diagnosis not present

## 2022-01-03 DIAGNOSIS — M810 Age-related osteoporosis without current pathological fracture: Secondary | ICD-10-CM

## 2022-01-03 DIAGNOSIS — Z7952 Long term (current) use of systemic steroids: Secondary | ICD-10-CM | POA: Diagnosis not present

## 2022-01-03 DIAGNOSIS — E785 Hyperlipidemia, unspecified: Secondary | ICD-10-CM | POA: Diagnosis not present

## 2022-01-04 ENCOUNTER — Ambulatory Visit (INDEPENDENT_AMBULATORY_CARE_PROVIDER_SITE_OTHER): Payer: Medicare Other | Admitting: Family Medicine

## 2022-01-04 VITALS — BP 116/52 | HR 61 | Ht 64.0 in | Wt 124.4 lb

## 2022-01-04 DIAGNOSIS — R739 Hyperglycemia, unspecified: Secondary | ICD-10-CM

## 2022-01-04 DIAGNOSIS — R55 Syncope and collapse: Secondary | ICD-10-CM

## 2022-01-04 LAB — COMPREHENSIVE METABOLIC PANEL
AG Ratio: 1.5 (calc) (ref 1.0–2.5)
ALT: 11 U/L (ref 6–29)
AST: 17 U/L (ref 10–35)
Albumin: 4.2 g/dL (ref 3.6–5.1)
Alkaline phosphatase (APISO): 63 U/L (ref 37–153)
BUN: 18 mg/dL (ref 7–25)
CO2: 22 mmol/L (ref 20–32)
Calcium: 9 mg/dL (ref 8.6–10.4)
Chloride: 103 mmol/L (ref 98–110)
Creat: 0.93 mg/dL (ref 0.60–1.00)
Globulin: 2.8 g/dL (calc) (ref 1.9–3.7)
Glucose, Bld: 129 mg/dL — ABNORMAL HIGH (ref 65–99)
Potassium: 4.3 mmol/L (ref 3.5–5.3)
Sodium: 136 mmol/L (ref 135–146)
Total Bilirubin: 0.3 mg/dL (ref 0.2–1.2)
Total Protein: 7 g/dL (ref 6.1–8.1)

## 2022-01-04 LAB — PHOSPHORUS: Phosphorus: 3.7 mg/dL (ref 2.1–4.3)

## 2022-01-04 LAB — MAGNESIUM: Magnesium: 2.1 mg/dL (ref 1.5–2.5)

## 2022-01-04 LAB — VITAMIN D 25 HYDROXY (VIT D DEFICIENCY, FRACTURES): Vit D, 25-Hydroxy: 36 ng/mL (ref 30–100)

## 2022-01-04 NOTE — Progress Notes (Signed)
Subjective:    Patient ID: Annette Barker, female    DOB: 1945-05-26, 76 y.o.   MRN: [id number]  Dizziness   Patient states that recently she has been feeling like she is going to pass out.  This occurs when she is walking.  She will become extremely lightheaded.  She will feel like everything is becoming black.  She will have to hold on and stop to keep from fainting.  She denies any irregular heartbeats or chest pain or shortness of breath.  The symptoms will subside.  However it does seem to be associated with exercise.  She denies any pleurisy or hemoptysis or cough.  She denies any angina.  Recently had blood work at her gynecologist that showed an elevated random blood sugar but this was not fasting.  She has not been checked for anemia although her color is good.  Her blood pressure today is mildly low. Past Medical History:  Diagnosis Date   Anxiety    Arthritis    in her hands    Elevated cholesterol    Hair loss    Hemorrhoids    Hyperlipidemia    Hypothyroidism    on synthroid   IBS (irritable bowel syndrome)    Lyme disease    Osteoporosis 03/2017   T score -3.2   Thyroid disease    hypothyroid   Past Surgical History:  Procedure Laterality Date   LAPAROSCOPIC SALPINGO OOPHERECTOMY Bilateral 10/29/2014   Procedure: LAPAROSCOPIC SALPINGO OOPHORECTOMY;  Surgeon: [name], MD;  Location: [facility];  Service: Gynecology;  Laterality: Bilateral;  1:00pm OR time requested  1 1/2 hours OR time requested.   SHOULDER SURGERY     TUBAL LIGATION     Current Outpatient Medications on File Prior to Visit  Medication Sig Dispense Refill   alendronate (FOSAMAX) 70 MG tablet Take 1 tablet (70 mg total) by mouth every 7 (seven) days. Take first thing in am with 6 oz. Water.  Be upright after taking.  Eat nothing for one hour. 12 tablet 3   B Complex Vitamins (VITAMIN B COMPLEX PO) Take by mouth.     diazepam (VALIUM) 5 MG tablet TAKE 1 TABLET BY MOUTH EVERYDAY AT BEDTIME 30  tablet 0   diphenhydrAMINE HCl (BENADRYL PO) Take by mouth.     Estradiol (DIVIGEL) 0.25 MG/0.25GM GEL Place topically on the lower abdomen daily 30 each 1   levothyroxine (SYNTHROID) 75 MCG tablet Take 1 tablet (75 mcg total) by mouth daily before breakfast. 90 tablet 3   Methylcellulose, Laxative, (CITRUCEL PO) Take by mouth.     predniSONE (DELTASONE) 1 MG tablet Take 1 mg by mouth daily with breakfast.     progesterone (PROMETRIUM) 100 MG capsule Take 1 capsule (100 mg total) by mouth daily. 30 capsule 1   triamcinolone cream (KENALOG) 0.1 % Apply 1 application topically 2 (two) times daily. 30 g 0   No current facility-administered medications on file prior to visit.   Allergies  Allergen Reactions   Hydromet [Hydrocodone Bit-Homatrop Mbr] Itching and Swelling   Codeine Swelling   Ibuprofen     Other reaction(s): reflux   Statins Other (See Comments)    Body cramps all over body   Social History   Socioeconomic History   Marital status: Widowed    Spouse name: Not on file   Number of children: Not on file   Years of education: Not on file   Highest education level: Not on file  Occupational History   Not on file  Tobacco Use   Smoking status: Former    Types: Cigarettes    Quit date: 05/27/1991    Years since quitting: 30.6   Smokeless tobacco: Never  Vaping Use   Vaping Use: Never used  Substance and Sexual Activity   Alcohol use: Yes    Alcohol/week: 0.0 standard drinks of alcohol    Comment: rare   Drug use: No   Sexual activity: Not Currently    Birth control/protection: Post-menopausal    Comment: 1st intercourse 76 yo--Fewer than 5 partners  Other Topics Concern   Not on file  Social History Narrative   Not on file   Social Determinants of Health   Financial Resource Strain: Low Risk  (10/29/2020)   Overall Financial Resource Strain (CARDIA)    Difficulty of Paying Living Expenses: Not hard at all  Food Insecurity: No Food Insecurity (10/29/2020)   Hunger  Vital Sign    Worried About Running Out of Food in the Last Year: Never true    Ran Out of Food in the Last Year: Never true  Transportation Needs: No Transportation Needs (10/29/2020)   PRAPARE - Hydrologist (Medical): No    Lack of Transportation (Non-Medical): No  Physical Activity: Sufficiently Active (10/29/2020)   Exercise Vital Sign    Days of Exercise per Week: 7 days    Minutes of Exercise per Session: 30 min  Stress: No Stress Concern Present (10/29/2020)   [place]    Feeling of Stress : Not at all  Social Connections: Moderately Isolated (10/29/2020)   Social Connection and Isolation Panel [NHANES]    Frequency of Communication with Friends and Family: More than three times a week    Frequency of Social Gatherings with Friends and Family: More than three times a week    Attends Religious Services: Never    Marine scientist or Organizations: No    Attends Archivist Meetings: Never    Marital Status: Living with partner  Intimate Partner Violence: Not At Risk (10/29/2020)   Humiliation, Afraid, Rape, and Kick questionnaire    Fear of Current or Ex-Partner: No    Emotionally Abused: No    Physically Abused: No    Sexually Abused: No      Review of Systems  Neurological:  Positive for dizziness.  All other systems reviewed and are negative.      Objective:   Physical Exam Vitals reviewed.  Constitutional:      General: She is not in acute distress.    Appearance: Normal appearance. She is not ill-appearing, toxic-appearing or diaphoretic.  HENT:     Head: Normocephalic and atraumatic.  Eyes:   Neck:     Vascular: No carotid bruit.  Cardiovascular:     Rate and Rhythm: Normal rate and regular rhythm.     Pulses: Normal pulses.     Heart sounds: Normal heart sounds. No murmur heard.    No friction rub. No gallop.  Pulmonary:     Effort: Pulmonary effort  is normal. No respiratory distress.     Breath sounds: Normal breath sounds. No stridor. No wheezing, rhonchi or rales.  Chest:     Chest wall: No tenderness.  Musculoskeletal:     Right lower leg: No edema.     Left lower leg: No edema.     Right foot: Normal range of motion. No  deformity, bunion, Charcot foot or prominent metatarsal heads.     Left foot: Normal range of motion. No deformity, bunion, Charcot foot or prominent metatarsal heads.  Feet:     Right foot:     Skin integrity: Skin integrity normal.     Left foot:     Skin integrity: Skin integrity normal.  Neurological:     General: No focal deficit present.     Mental Status: She is alert and oriented to person, place, and time. Mental status is at baseline.     Motor: No weakness.     Coordination: Coordination normal.           Assessment & Plan:  Elevated blood sugar - Plan: Hemoglobin A1c  Near syncope - Plan: EKG 12-Lead, CBC with Differential/Platelet, COMPLETE METABOLIC PANEL WITH GFR  I believe her elevated blood sugar is a fluke related to a nonfasting sample.  I will check a hemoglobin A1c to confirm this.  However I am concerned by her near syncope.  Her exam today is normal.  However I am concerned by the fact this occurs with exercise.  Makes me concerned about cerebral hypoperfusion related to exercise.  This could be due to hypotension from dehydration.  However I feel that we need to get a stress test and an echocardiogram to evaluate further.  Therefore I would recommend cardiology consultation.  EKG today shows normal sinus rhythm with normal intervals and normal axis no evidence of ischemia or infarction.

## 2022-01-05 DIAGNOSIS — M6289 Other specified disorders of muscle: Secondary | ICD-10-CM | POA: Diagnosis not present

## 2022-01-05 DIAGNOSIS — M62838 Other muscle spasm: Secondary | ICD-10-CM | POA: Diagnosis not present

## 2022-01-05 DIAGNOSIS — R159 Full incontinence of feces: Secondary | ICD-10-CM | POA: Diagnosis not present

## 2022-01-05 DIAGNOSIS — R152 Fecal urgency: Secondary | ICD-10-CM | POA: Diagnosis not present

## 2022-01-05 DIAGNOSIS — M6281 Muscle weakness (generalized): Secondary | ICD-10-CM | POA: Diagnosis not present

## 2022-01-05 LAB — CBC WITH DIFFERENTIAL/PLATELET
Absolute Monocytes: 451 cells/uL (ref 200–950)
Basophils Absolute: 61 cells/uL (ref 0–200)
Basophils Relative: 1.1 %
Eosinophils Absolute: 149 cells/uL (ref 15–500)
Eosinophils Relative: 2.7 %
HCT: 34.3 % — ABNORMAL LOW (ref 35.0–45.0)
Hemoglobin: 11.5 g/dL — ABNORMAL LOW (ref 11.7–15.5)
Lymphs Abs: 1430 cells/uL (ref 850–3900)
MCH: 33.1 pg — ABNORMAL HIGH (ref 27.0–33.0)
MCHC: 33.5 g/dL (ref 32.0–36.0)
MCV: 98.8 fL (ref 80.0–100.0)
MPV: 9.9 fL (ref 7.5–12.5)
Monocytes Relative: 8.2 %
Neutro Abs: 3410 cells/uL (ref 1500–7800)
Neutrophils Relative %: 62 %
Platelets: 266 10*3/uL (ref 140–400)
RBC: 3.47 10*6/uL — ABNORMAL LOW (ref 3.80–5.10)
RDW: 13 % (ref 11.0–15.0)
Total Lymphocyte: 26 %
WBC: 5.5 10*3/uL (ref 3.8–10.8)

## 2022-01-05 LAB — COMPLETE METABOLIC PANEL WITH GFR
AG Ratio: 1.5 (calc) (ref 1.0–2.5)
ALT: 10 U/L (ref 6–29)
AST: 16 U/L (ref 10–35)
Albumin: 4.2 g/dL (ref 3.6–5.1)
Alkaline phosphatase (APISO): 62 U/L (ref 37–153)
BUN: 12 mg/dL (ref 7–25)
CO2: 24 mmol/L (ref 20–32)
Calcium: 9.3 mg/dL (ref 8.6–10.4)
Chloride: 105 mmol/L (ref 98–110)
Creat: 0.99 mg/dL (ref 0.60–1.00)
Globulin: 2.8 g/dL (calc) (ref 1.9–3.7)
Glucose, Bld: 110 mg/dL — ABNORMAL HIGH (ref 65–99)
Potassium: 4.5 mmol/L (ref 3.5–5.3)
Sodium: 139 mmol/L (ref 135–146)
Total Bilirubin: 0.3 mg/dL (ref 0.2–1.2)
Total Protein: 7 g/dL (ref 6.1–8.1)
eGFR: 60 mL/min/{1.73_m2} (ref >=60)

## 2022-01-05 LAB — HEMOGLOBIN A1C
Hgb A1c MFr Bld: 5.4 % of total Hgb (ref <5.7)
Mean Plasma Glucose: 108 mg/dL
eAG (mmol/L): 6 mmol/L

## 2022-01-06 ENCOUNTER — Encounter: Payer: Self-pay | Admitting: Obstetrics and Gynecology

## 2022-01-06 ENCOUNTER — Other Ambulatory Visit: Payer: Self-pay | Admitting: Family Medicine

## 2022-01-06 NOTE — Telephone Encounter (Signed)
I called pharmacy [facility] and was informed medication is non formulary. The formulary drug is provera Rx sent.

## 2022-01-06 NOTE — Telephone Encounter (Signed)
Can you please confirm with the pharmacy that I sent in the generic of the prometrium, if not please have them try that. If that is too expensive, then send in provera 2.5 mg daily, 90 with 3 refills. Please also see if she has started the divigel and if so how she is doing on it. If doing well you can send in a 3 month supply with 3 refills

## 2022-01-06 NOTE — Telephone Encounter (Signed)
Requested medication (s) are due for refill today: yes  Requested medication (s) are on the active medication list: yes  Last refill:  09/30/21 #30/0  Future visit scheduled: no  Notes to clinic:  Unable to refill per protocol, cannot delegate.      Requested Prescriptions  Pending Prescriptions Disp Refills   diazepam (VALIUM) 5 MG tablet [Pharmacy Med Name: DIAZEPAM 5 MG TABLET] 30 tablet 0    Sig: TAKE 1 TABLET BY MOUTH EVERYDAY AT BEDTIME     Not Delegated - Psychiatry: Anxiolytics/Hypnotics 2 Failed - 01/06/2022  9:27 AM      Failed - This refill cannot be delegated      Failed - Urine Drug Screen completed in last 360 days      Passed - Patient is not pregnant      Passed - Valid encounter within last 6 months    Recent Outpatient Visits           3 months ago Rhinosinusitis   [facility], Cammie Mcgee, MD   9 months ago Neuropathy   [facility], Cammie Mcgee, MD   1 year ago Concussion without loss of consciousness, initial encounter   [facility], Cammie Mcgee, MD   1 year ago Weight loss   [facility] Susy Frizzle, MD   2 years ago Pain of left clavicle   [facility], Cammie Mcgee, MD       Future Appointments             In 4 days Nahser, Wonda Cheng, MD [place], [facility]

## 2022-01-10 ENCOUNTER — Other Ambulatory Visit (HOSPITAL_COMMUNITY): Payer: Self-pay | Admitting: Cardiovascular Disease

## 2022-01-10 ENCOUNTER — Other Ambulatory Visit: Payer: Self-pay | Admitting: Cardiovascular Disease

## 2022-01-10 ENCOUNTER — Ambulatory Visit
Admission: RE | Admit: 2022-01-10 | Discharge: 2022-01-10 | Disposition: A | Discharge status: Home / Self Care | Payer: Medicare Other | Source: Ambulatory Visit | Attending: Cardiovascular Disease | Admitting: Cardiovascular Disease

## 2022-01-10 ENCOUNTER — Encounter: Payer: Self-pay | Admitting: Cardiovascular Disease

## 2022-01-10 ENCOUNTER — Ambulatory Visit (INDEPENDENT_AMBULATORY_CARE_PROVIDER_SITE_OTHER): Payer: Medicare Other | Admitting: Cardiovascular Disease

## 2022-01-10 VITALS — BP 110/70 | HR 59 | Ht 64.0 in | Wt 123.2 lb

## 2022-01-10 DIAGNOSIS — E7800 Pure hypercholesterolemia, unspecified: Secondary | ICD-10-CM

## 2022-01-10 DIAGNOSIS — I208 Other forms of angina pectoris: Secondary | ICD-10-CM

## 2022-01-10 DIAGNOSIS — E782 Mixed hyperlipidemia: Secondary | ICD-10-CM

## 2022-01-10 DIAGNOSIS — I200 Unstable angina: Secondary | ICD-10-CM

## 2022-01-10 DIAGNOSIS — R079 Chest pain, unspecified: Secondary | ICD-10-CM

## 2022-01-10 DIAGNOSIS — I2089 Other forms of angina pectoris: Secondary | ICD-10-CM

## 2022-01-10 MED ORDER — NITROGLYCERIN 0.4 MG SL SUBL
SUBLINGUAL_TABLET | SUBLINGUAL | Status: AC
  Filled 2022-01-10: qty 2

## 2022-01-10 MED ORDER — IOHEXOL 350 MG/ML SOLN
75.0000 mL | Freq: Once | INTRAVENOUS | Status: AC | PRN
Start: 2022-01-10 — End: 2022-01-10
  Administered 2022-01-10: 75 mL via INTRAVENOUS

## 2022-01-10 MED ORDER — MEDROXYPROGESTERONE ACETATE 2.5 MG PO TABS: 2.5000 mg | ORAL_TABLET | Freq: Every day | ORAL | 3 refills | Status: DC

## 2022-01-10 MED ORDER — EZETIMIBE 10 MG PO TABS: 10.0000 mg | ORAL_TABLET | Freq: Every day | ORAL | 11 refills | Status: DC

## 2022-01-10 MED ORDER — NITROGLYCERIN 0.4 MG SL SUBL
0.8000 mg | SUBLINGUAL_TABLET | Freq: Once | SUBLINGUAL | Status: AC
  Administered 2022-01-10: 0.8 mg via SUBLINGUAL
  Filled 2022-01-10: qty 25

## 2022-01-10 MED ORDER — METOPROLOL TARTRATE 5 MG/5ML IV SOLN
10.0000 mg | Freq: Once | INTRAVENOUS | Status: DC
  Filled 2022-01-10: qty 10

## 2022-01-10 NOTE — Patient Instructions (Addendum)
Get some liquid IV packets to take with you.  At least your water bottle while out [place] can also consider adding Nuun tablets to your bottles of water. Start your morning with a V8 juice.  Consider V8 juice at lunch if it seems to help.  Medication Instructions:  START Aspirin '81mg'$  daily  *If you need a refill on your cardiac medications before your next appointment, please call your pharmacy*   Lab Work: NONE If you have labs (blood work) drawn today and your tests are completely normal, you will receive your results only by: [place] (if you have MyChart) OR A paper copy in the mail If you have any lab test that is abnormal or we need to change your treatment, we will call you to review the results.   Testing/Procedures: Coronary CT Angiogram Your physician has requested that you have cardiac CT. Cardiac computed tomography (CT) is a painless test that uses an x-ray machine to take clear, detailed pictures of your heart. For further information please visit [email or web address]. Please follow instruction sheet as given.  Follow-Up: At [facility], you and your health needs are our priority.  As part of our continuing mission to provide you with exceptional heart care, we have created designated Provider Care Teams.  These Care Teams include your primary Cardiologist (physician) and Advanced Practice Providers (APPs -  Physician Assistants and Nurse Practitioners) who all work together to provide you with the care you need, when you need it.  We recommend signing up for the patient portal called "MyChart".  Sign up information is provided on this After Visit Summary.  MyChart is used to connect with patients for Virtual Visits (Telemedicine).  Patients are able to view lab/test results, encounter notes, upcoming appointments, etc.  Non-urgent messages can be sent to your provider as well.   To learn more about what you can do with MyChart, go to [email or web address].     Your next appointment:   6 week(s)  The format for your next appointment:   In Person  Provider:   Mertie Moores, MD    Other Instructions   Your cardiac CT will be scheduled at one of the below locations:   [facility] [address] [place], [place] [address] [phone]  please arrive at the [facility] and Children's Entrance (Entrance C2) of [facility] 30 minutes prior to test start time. You can use the FREE valet parking offered at entrance C (encouraged to control the heart rate for the test)  Proceed to the [facility] Radiology Department (first floor) to check-in and test prep.  All radiology patients and guests should use entrance C2 at [facility], accessed from [facility], even though the hospital's physical address listed is [address]     Please follow these instructions carefully (unless otherwise directed):  On the Night Before the Test: Be sure to Drink plenty of water. Do not consume any caffeinated/decaffeinated beverages or chocolate 12 hours prior to your test. Do not take any antihistamines 12 hours prior to your test. On the Day of the Test: Drink plenty of water until 1 hour prior to the test. Do not eat any food 4 hours prior to the test. You may take your regular medications prior to the test.  Take metoprolol (Lopressor) two hours prior to test. FEMALES- please wear underwire-free bra if available, avoid dresses & tight clothing  After the Test: Drink plenty of water. After receiving IV  contrast, you may experience a mild flushed feeling. This is normal. On occasion, you may experience a mild rash up to 24 hours after the test. This is not dangerous. If this occurs, you can take Benadryl 25 mg and increase your fluid intake. If you experience trouble breathing, this can be serious. If it is severe call 911 IMMEDIATELY. If it is mild, please call our office. If you take any of these  medications: Glipizide/Metformin, Avandament, Glucavance, please do not take 48 hours after completing test unless otherwise instructed.  We will call to schedule your test 2-4 weeks out understanding that some insurance companies will need an authorization prior to the service being performed.   For non-scheduling related questions, please contact the cardiac imaging nurse navigator should you have any questions/concerns: Marchia Bond, Cardiac Imaging Nurse Navigator Gordy Clement, Cardiac Imaging Nurse Navigator [facility] Direct Office Dial: [phone]   For scheduling needs, including cancellations and rescheduling, please call [place], [phone].   Important Information About Sugar

## 2022-01-10 NOTE — Progress Notes (Signed)
Patient tolerated procedure well. Ambulate w/o difficulty. Denies light headedness or being dizzy. Sitting in chair. Encouraged to drink extra water today and reasoning explained. Verbalized understanding. All questions answered. ABC intact. No further needs. Discharge from procedure area w/o issues.

## 2022-01-10 NOTE — Progress Notes (Signed)
No new orders

## 2022-01-10 NOTE — Progress Notes (Signed)
Cardiology Office Note:    Date:  01/10/2022   ID:  LEVINA BOYACK, DOB [birth year], MRN [id number]  PCP:  Susy Frizzle, MD   [place] Providers Cardiologist:  Katheen Aslin  Click to update primary MD,subspecialty MD or APP then REFRESH:1}    Referring MD: Susy Frizzle, MD   Chief Complaint  Patient presents with   Near Syncope     History of Present Illness:    Annette Barker is a 76 y.o. female with a hx of  HLD, hypothyroidism.  She had an episode of near syncope and we are asked to see her for further evaluation.  I have met [name] in the past ( neighbor on [place], had property in [place], [place] )  Bought a condo .   Walks her dog 3-4 miles a day ,   Around 30 minutes into the walk, feels like she is going to pass out  Also has vertigo but this issue is different .   Eats a mostly vegetarian diet ,  No processed meats    Is planning on going to [place] and [place] soon .  [place] restoring [place] old [place]oon [place]  Has HLD - last LDL was 189 Has tried multiple statins   Sister had a cardia arrest  Mother had alzheimers     Past Medical History:  Diagnosis Date   Anxiety    Arthritis    in her hands    Elevated cholesterol    Hair loss    Hemorrhoids    Hyperlipidemia    Hypothyroidism    on synthroid   IBS (irritable bowel syndrome)    Lyme disease    Osteoporosis 03/2017   T score -3.2   Thyroid disease    hypothyroid    Past Surgical History:  Procedure Laterality Date   LAPAROSCOPIC SALPINGO OOPHERECTOMY Bilateral 10/29/2014   Procedure: LAPAROSCOPIC SALPINGO OOPHORECTOMY;  Surgeon: [name], MD;  Location: [facility];  Service: Gynecology;  Laterality: Bilateral;  1:00pm OR time requested  1 1/2 hours OR time requested.   SHOULDER SURGERY     TUBAL LIGATION      Current Medications: Current Meds  Medication Sig   B Complex Vitamins (VITAMIN B COMPLEX PO) Take 1 tablet by mouth every other day.   diazepam  (VALIUM) 5 MG tablet TAKE 1 TABLET BY MOUTH EVERYDAY AT BEDTIME   diphenhydrAMINE HCl (BENADRYL PO) Take 1 tablet by mouth daily as needed.   Estradiol (DIVIGEL) 0.25 MG/0.25GM GEL Place topically on the lower abdomen daily   levothyroxine (SYNTHROID) 75 MCG tablet Take 1 tablet (75 mcg total) by mouth daily before breakfast.   medroxyPROGESTERone (PROVERA) 2.5 MG tablet Take 1 tablet (2.5 mg total) by mouth daily.   Methylcellulose, Laxative, (CITRUCEL PO) Take by mouth daily in the afternoon.   predniSONE (DELTASONE) 1 MG tablet Take 1 mg by mouth daily with breakfast.   progesterone (PROMETRIUM) 100 MG capsule Take 1 capsule (100 mg total) by mouth daily.   triamcinolone cream (KENALOG) 0.1 % Apply 1 application topically 2 (two) times daily.     Allergies:   Hydromet [hydrocodone bit-homatrop mbr], Codeine, Ibuprofen, and Statins   Social History   Socioeconomic History   Marital status: Widowed    Spouse name: Not on file   Number of children: Not on file   Years of education: Not on file   Highest education level: Not on file  Occupational History  Not on file  Tobacco Use   Smoking status: Former    Types: Cigarettes    Quit date: 05/27/1991    Years since quitting: 30.6   Smokeless tobacco: Never  Vaping Use   Vaping Use: Never used  Substance and Sexual Activity   Alcohol use: Yes    Alcohol/week: 0.0 standard drinks of alcohol    Comment: rare   Drug use: No   Sexual activity: Not Currently    Birth control/protection: Post-menopausal    Comment: 1st intercourse 76 yo--Fewer than 5 partners  Other Topics Concern   Not on file  Social History Narrative   Not on file   Social Determinants of Health   Financial Resource Strain: Low Risk  (10/29/2020)   Overall Financial Resource Strain (CARDIA)    Difficulty of Paying Living Expenses: Not hard at all  Food Insecurity: No Food Insecurity (10/29/2020)   Hunger Vital Sign    Worried About Running Out of Food in the  Last Year: Never true    Ran Out of Food in the Last Year: Never true  Transportation Needs: No Transportation Needs (10/29/2020)   PRAPARE - Hydrologist (Medical): No    Lack of Transportation (Non-Medical): No  Physical Activity: Sufficiently Active (10/29/2020)   Exercise Vital Sign    Days of Exercise per Week: 7 days    Minutes of Exercise per Session: 30 min  Stress: No Stress Concern Present (10/29/2020)   [place]    Feeling of Stress : Not at all  Social Connections: Moderately Isolated (10/29/2020)   Social Connection and Isolation Panel [NHANES]    Frequency of Communication with Friends and Family: More than three times a week    Frequency of Social Gatherings with Friends and Family: More than three times a week    Attends Religious Services: Never    Marine scientist or Organizations: No    Attends Music therapist: Never    Marital Status: Living with partner     Family History: The patient's family history includes Alcohol abuse in her sister; Alzheimer's disease in her mother; Anorexia nervosa in her grandchild; Arthritis in her father, mother, sister, and sister; Cancer in her father and sister; Colitis in her son; Heart disease in her sister; Hyperlipidemia in her father; Osteoporosis in her father. There is no history of Colon cancer, Stomach cancer, Esophageal cancer, or Pancreatic cancer.  ROS:   Please see the history of present illness.     All other systems reviewed and are negative.  EKGs/Labs/Other Studies Reviewed:    The following studies were reviewed today:   EKG: January 04, 2022: Normal sinus rhythm.  She has T wave inversions in leads V2 through V4 worrisome for anterior ischemia.    Recent Labs: 03/26/2021: TSH 1.28 01/03/2022: Magnesium 2.1 01/04/2022: ALT 10; BUN 12; Creat 0.99; Hemoglobin 11.5; Platelets 266; Potassium 4.5; Sodium 139   Recent Lipid Panel    Component Value Date/Time   CHOL 262 (H) 01/15/2019 1007   TRIG 79 01/15/2019 1007   HDL 55 01/15/2019 1007   CHOLHDL 4.8 01/15/2019 1007   VLDL 14 10/13/2015 1219   LDLCALC 189 (H) 01/15/2019 1007     Risk Assessment/Calculations:                Physical Exam:    VS:  BP 110/70   Pulse (!) 59   Ht 5'  4" (1.626 m)   Wt 123 lb 3.2 oz (55.9 kg)   LMP 05/23/1998   SpO2 98%   BMI 21.15 kg/m     Wt Readings from Last 3 Encounters:  01/10/22 123 lb 3.2 oz (55.9 kg)  01/04/22 124 lb 6.4 oz (56.4 kg)  12/29/21 122 lb (55.3 kg)     GEN:   Well nourished, well developed in no acute distress HEENT: Normal NECK: No JVD; No carotid bruits LYMPHATICS: No lymphadenopathy CARDIAC: RRR, no murmurs, rubs, gallops RESPIRATORY:  Clear to auscultation without rales, wheezing or rhonchi  ABDOMEN: Soft, non-tender, non-distended MUSCULOSKELETAL:  No edema; No deformity  SKIN: Warm and dry NEUROLOGIC:  Alert and oriented x 3 PSYCHIATRIC:  Normal affect   ASSESSMENT:    No diagnosis found. PLAN:    In order of problems listed above:   Unstable angina :   pt has exertional weakness, profound weakness.   Lasts for several minutes.   Resting ECG shows anterior T wave inversions.  She has hyperlipidemia with an LDL of 189.  She does not tolerate statins. We will start her on aspirin 81 mg a day.  We will schedule her for a coronary CT angiogram.  Some of her symptoms might be due to volume depletion.  I encouraged her to drink better and to add some salt to her diet as well as some additional protein.  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  I've asked her to postpone this trip at least a week.  We will see if we can get a coronary CT angiogram this week.  Will discuss the results of the Cor CTA before she goes out [place] .    2.  Mild mitral regurgitation: She has a mild mitral valve murmur.  It sounds very trivial  and I do not think she needs any additional work-up on this at this time.  We can consider an echocardiogram at a later time.  3.  Hyperlipidemia: She has an LDL of 189.  She has tried multiple statins but did not tolerate them.  I will see her back in 6 to 8 weeks.  Based on the results of the coronary CT angiogram we will likely need to refer her to our lipid clinic for consideration of a PCSK9 inhibitor.            Medication Adjustments/Labs and Tests Ordered: Current medicines are reviewed at length with the patient today.  Concerns regarding medicines are outlined above.  No orders of the defined types were placed in this encounter.  No orders of the defined types were placed in this encounter.   There are no Patient Instructions on file for this visit.   Signed, Mertie Moores, MD  01/10/2022 9:38 AM    [place]

## 2022-01-11 ENCOUNTER — Telehealth: Payer: Self-pay

## 2022-01-11 ENCOUNTER — Encounter: Payer: Self-pay | Admitting: Cardiovascular Disease

## 2022-01-11 DIAGNOSIS — E782 Mixed hyperlipidemia: Secondary | ICD-10-CM

## 2022-01-11 NOTE — Telephone Encounter (Signed)
Called and spoke with patient. She will pick up the Zetia tomorrow and begin taking. PharmD referral has been placed at this time. She will be away from her phone until [place] 12 (on [place] but then can be reached to schedule. F/u appt with Dr Acie Fredrickson has been scheduled at this time.

## 2022-01-11 NOTE — Telephone Encounter (Signed)
-----   Message from Thayer Headings, MD sent at 01/10/2022  6:05 PM EDT ----- Coronary calcium score is 291.  This places Annette Barker in the 80th percentile for age and sex matched controls. RCA-minimal CAD Left main normal LAD, mild to moderate proximal plaque Left circumflex-normal  Annette Barker symptoms do not appear to be caused by coronary artery disease.  I suspect that she is volume depleted. I encouraged Annette Barker to stay hydrated during Annette Barker trip out [place].  She does have very high LDL and does not tolerate statins.  I have sent in a prescription for Zetia 10 mg a day.  She will start that. Lets refer Annette Barker to our lipid clinic for consideration of a PCSK9 inhibitor or inclisiran.  We will see Annette Barker in several weeks after she is back from Annette Barker trip out [place].

## 2022-02-07 ENCOUNTER — Other Ambulatory Visit: Payer: Self-pay | Admitting: Obstetrics and Gynecology

## 2022-02-07 DIAGNOSIS — Z79890 Hormone replacement therapy (postmenopausal): Secondary | ICD-10-CM

## 2022-02-08 ENCOUNTER — Other Ambulatory Visit: Payer: Self-pay | Admitting: Obstetrics and Gynecology

## 2022-02-08 DIAGNOSIS — Z79890 Hormone replacement therapy (postmenopausal): Secondary | ICD-10-CM

## 2022-02-08 NOTE — Telephone Encounter (Signed)
Please remind the patient to schedule a mammogram, needs to be UTD on her mammograms to continue on HRT. Please make sure she is taking her provera daily.

## 2022-02-08 NOTE — Telephone Encounter (Signed)
Medication refill request: estradiol  Last AEX:  11/03/21 Next AEX: none  Last MMG (if hormonal medication request): 07/15/20 patient has not had a mammo in 2023 per [facility]  Refill authorized: please advise

## 2022-02-09 ENCOUNTER — Ambulatory Visit: Payer: Medicare Other | Admitting: Internal Medicine

## 2022-02-14 ENCOUNTER — Encounter: Payer: Self-pay | Admitting: Family Medicine

## 2022-02-14 ENCOUNTER — Encounter: Payer: Self-pay | Admitting: Obstetrics and Gynecology

## 2022-02-15 NOTE — Telephone Encounter (Signed)
I called and checked on patient and informed her with the below. Patient reports she told Dr.Pickard told her to stop Fosamax. Patient said she does have Rheumatology and will follow up with her.  She did ask me to send information in my chart message as well.

## 2022-02-15 NOTE — Telephone Encounter (Signed)
Please call the patient and check on her. She didn't tolerate fosamax. Her calcium and vit d levels were normal. She should be getting 1,200 mg of vit d daily and 800 IU of vit D 3 daily (between diet and supplement).  Given her osteoporosis and intolerance of oral medication (now and previously), I would recommend f/u with Rheumatology to discuss other options for treating her osteoporosis.  Calcium and vit d are helpful, but aren't enough to treat her osteoporosis.

## 2022-02-17 ENCOUNTER — Encounter: Payer: Self-pay | Admitting: Family Medicine

## 2022-03-07 ENCOUNTER — Ambulatory Visit: Payer: Medicare Other | Admitting: Cardiovascular Disease

## 2022-03-09 DIAGNOSIS — R152 Fecal urgency: Secondary | ICD-10-CM | POA: Diagnosis not present

## 2022-03-09 DIAGNOSIS — M62838 Other muscle spasm: Secondary | ICD-10-CM | POA: Diagnosis not present

## 2022-03-09 DIAGNOSIS — M6281 Muscle weakness (generalized): Secondary | ICD-10-CM | POA: Diagnosis not present

## 2022-03-09 DIAGNOSIS — R159 Full incontinence of feces: Secondary | ICD-10-CM | POA: Diagnosis not present

## 2022-03-09 DIAGNOSIS — M6289 Other specified disorders of muscle: Secondary | ICD-10-CM | POA: Diagnosis not present

## 2022-03-18 ENCOUNTER — Encounter: Payer: Self-pay | Admitting: Family Medicine

## 2022-03-22 ENCOUNTER — Ambulatory Visit: Payer: Medicare Other | Attending: Cardiology | Admitting: Student

## 2022-03-22 ENCOUNTER — Telehealth: Payer: Self-pay | Admitting: Pharmacist

## 2022-03-22 DIAGNOSIS — E785 Hyperlipidemia, unspecified: Secondary | ICD-10-CM | POA: Diagnosis not present

## 2022-03-22 DIAGNOSIS — I200 Unstable angina: Secondary | ICD-10-CM | POA: Diagnosis not present

## 2022-03-22 MED ORDER — PRALUENT 75 MG/ML ~~LOC~~ SOAJ: 140.0000 mg | SUBCUTANEOUS | 11 refills | Status: DC

## 2022-03-22 NOTE — Assessment & Plan Note (Addendum)
Assessment:   LDLc: uncontrolled goal <70 mg/dl   Patient tried atorvastatin, pravastatin and rosuvastatin in the past; could not tolerate any of them at various dose - sever muscle cramps in hands and legs   Patient has not started taking Zetia but will be starting soon   Current treatment is appropriate but insufficient to lower LDLc   Given the risk factors age, unstable angina,Coronary calcium score- 291, and LDLc -189 it is reaseanable to add PCSK9i to current lipid lowering agent   Discussed evolocumab dosage,administration instructions, effectiveness and potential side effects  Patient has appointment with Dr. Acie Fredrickson next week would like to discuss it further before she decide to go on the new medication   Plan:   Will apply for prior approval for Repatha and find out the co-pay once it's approved  Patient to start taking Zetia 10 mg daily   Pharm D to inform patient on PA status via MyChart or Call   Repeat lipid lab in 2-3 months if patient start taking Repatha

## 2022-03-22 NOTE — Progress Notes (Signed)
Patient ID: [name]                 DOB: [birth year]                    MRN: [id number]      HPI: Annette Barker is a 76 y.o. female patient referred to lipid clinic by Annette Barker. PMH is significant for osteoporosis, HLD, hypothyroidism unstable angina .  Coronary calcium score is 291. This places her in the 80th percentile for age and sex matched controls.  Today no acute concern reported by patient. Patient has not started taking Zetia as she was worried about getting diarrhea from it. Patient eats well;mainly plant based diet. Very active walks 4 miles per day and loves biking and kayaking. We discussed last LDLc lab, CAC score, future risk and potential options to lower the LDLc. Patient takes lots of supplement and wanted to be sure this new med has no interaction with any of her current medications. And I assured patient that there is none. Discussed PCSK9i - evolocumab dosage,administration instructions, effectiveness and potential side effects. Patient is concern about the price of the medication. Patient would like to talk to his PCP and Annette Barker about this medication first. However patient wants [place] to apply for PA and find out about co-pay.    Current Medications: Zetia 10 mg daily  Intolerances: Cholestyramine 2 gm twice daily, rosuvastatin 10 mg daily( 01/21/2019) severe body cramps   pravastatin 20 mg ( 06/12/2015),atorvastatin -leg and hands cramps   Risk Factors: age, unstable angina,Coronary calcium score is 291. LDL goal: <70 mg/dl   Diet: mainly plant based diet, fish once week, hamburger or pork once week. Usually high fiber low carb diet.   Exercise:  4 miles walking per day everyday and biking 2-3 times per week (4-7 miles)  Annette Barker [employer] to Annette Barker  Family History: father- dyslipidemia, leukemia,  Mother- alzheimer's (early onset), IBS Sister- cancer  Middle sister- alcoholic and heavy smoker, had hear attack   Social History:  Smoking: none Alcohol: social 2 drinks  per week  Labs: Lipid Panel     Component Value Date/Time   CHOL 262 (H) 01/15/2019 1007   TRIG 79 01/15/2019 1007   HDL 55 01/15/2019 1007   CHOLHDL 4.8 01/15/2019 1007   VLDL 14 10/13/2015 1219   LDLCALC 189 (H) 01/15/2019 1007    Past Medical History:  Diagnosis Date   Anxiety    Arthritis    in her hands    Elevated cholesterol    Hair loss    Hemorrhoids    Hyperlipidemia    Hypothyroidism    on synthroid   IBS (irritable bowel syndrome)    Lyme disease    Osteoporosis 03/2017   T score -3.2   Thyroid disease    hypothyroid    Current Outpatient Medications on File Prior to Visit  Medication Sig Dispense Refill   alendronate (FOSAMAX) 70 MG tablet Take 1 tablet (70 mg total) by mouth every 7 (seven) days. Take first thing in am with 6 oz. Water.  Be upright after taking.  Eat nothing for one hour. (Patient not taking: Reported on 01/10/2022) 12 tablet 3   B Complex Vitamins (VITAMIN B COMPLEX PO) Take 1 tablet by mouth every other day.     diazepam (VALIUM) 5 MG tablet TAKE 1 TABLET BY MOUTH EVERYDAY AT BEDTIME 30 tablet 0   diphenhydrAMINE HCl (BENADRYL PO) Take 1 tablet by mouth daily as  needed.     Estradiol 0.25 MG/0.25GM GEL PLACE TOPICALLY ON THE LOWER ABDOMEN DAILY 90 each 2   ezetimibe (ZETIA) 10 MG tablet Take 1 tablet (10 mg total) by mouth daily. 30 tablet 11   levothyroxine (SYNTHROID) 75 MCG tablet Take 1 tablet (75 mcg total) by mouth daily before breakfast. 90 tablet 3   medroxyPROGESTERone (PROVERA) 2.5 MG tablet Take 1 tablet (2.5 mg total) by mouth daily. 90 tablet 3   Methylcellulose, Laxative, (CITRUCEL PO) Take by mouth daily in the afternoon.     predniSONE (DELTASONE) 1 MG tablet Take 1 mg by mouth daily with breakfast.     progesterone (PROMETRIUM) 100 MG capsule Take 1 capsule (100 mg total) by mouth daily. 30 capsule 1   triamcinolone cream (KENALOG) 0.1 % Apply 1 application topically 2 (two) times daily. 30 g 0   No current  facility-administered medications on file prior to visit.    Allergies  Allergen Reactions   Hydromet [Hydrocodone Bit-Homatrop Mbr] Itching and Swelling   Codeine Swelling   Ibuprofen     Other reaction(s): reflux   Statins Other (See Comments)    Body cramps all over body     Hyperlipidemia Assessment:  LDLc: uncontrolled goal <70 mg/dl  Patient tried atorvastatin, pravastatin and rosuvastatin in the past; could not tolerate any of them at various dose - sever muscle cramps in hands and legs  Patient has not started taking Zetia but will be starting soon  Current treatment is appropriate but insufficient to lower LDLc  Given the risk factors age, unstable angina,Coronary calcium score- 291, and LDLc -189 it is reaseanable to add PCSK9i to current lipid lowering agent  Discussed evolocumab dosage,administration instructions, effectiveness and potential side effects Patient has appointment with Dr. Acie Barker next week would like to discuss it further before she decide to go on the new medication   Plan:  Will apply for prior approval for Repatha and find out the co-pay once it's approved Patient to start taking Zetia 10 mg daily  Pharm D to inform patient on PA status via MyChart or Call  Repeat lipid lab in 2-3 months if patient start taking Repatha    Thank you,  Annette Barker, Pharm.D [facility] of [facility] [address], [place], [place] [address]  Phone: [phone]; Fax: [phone]

## 2022-03-22 NOTE — Patient Instructions (Signed)
I will submit a prior authorization for Repatha. I will call you once I hear back.Please call me at [phone] with any questions.   Repatha is a cholesterol medication that improved your body's ability to get rid of "bad cholesterol" known as LDL. It can lower your LDL up to 60%! It is an injection that is given under the skin every 2 weeks. The medication often requires a prior authorization from your insurance company. We will take care of submitting all the necessary information to your insurance company to get it approved. The most common side effects of Repatha include runny nose, symptoms of the common cold, rarely flu or flu-like symptoms, back/muscle pain in about 3-4% of the patients, and redness, pain, or bruising at the injection site. Tell your healthcare provider if you have any side effect that bothers you or that does not go away.

## 2022-03-22 NOTE — Telephone Encounter (Signed)
Praluent PA request submitted  Key: [id number]

## 2022-03-23 ENCOUNTER — Encounter: Payer: Self-pay | Admitting: Family Medicine

## 2022-03-24 NOTE — Telephone Encounter (Signed)
Called and spoke w/this evening regarding her mammogram. Per pt will call to make appt.

## 2022-03-25 ENCOUNTER — Encounter: Payer: Self-pay | Admitting: Pharmacist

## 2022-03-27 ENCOUNTER — Encounter: Payer: Self-pay | Admitting: Cardiovascular Disease

## 2022-03-27 NOTE — Progress Notes (Unsigned)
Cardiology Office Note:    Date:  03/28/2022   ID:  Annette Barker, DOB 06/02/1945, MRN [id number]  PCP:  Susy Frizzle, MD   [facility] Providers Cardiologist:  Justun Anaya  Click to update primary MD,subspecialty MD or APP then REFRESH:1}    Referring MD: Susy Frizzle, MD   Chief Complaint  Patient presents with   Chest Pain   Hyperlipidemia     History of Present Illness:    Annette Barker is a 76 y.o. female with a hx of  HLD, hypothyroidism.  She had an episode of near syncope and we are asked to see her for further evaluation.  I have met Annette Barker in the past ( neighbor on [address], had property in [place], [place] )  Bought a condo .   Walks her dog 3-4 miles a day ,   Around 30 minutes into the walk, feels like she is going to pass out  Also has vertigo but this issue is different .   Eats a mostly vegetarian diet ,  No processed meats    Is planning on going to [place] and [place] soon .  [place] be restoring an old [place]oon [place]  Has HLD - last LDL was 189 Has tried multiple statins   Sister had a cardia arrest  Mother had alzheimers    NOV. 6, 2023  Annette Barker is seen for follow up of her CP, HLD Coronary CTA revealed:  . Coronary calcium score of 291. This was 80th percentile for age and sex matched control LAD - mild plaque LCx - mild - mod plaque RCA - mild plaque  Took a trip out [place] ( [place], [employer])  Working through the [employer]  - working on the [employer] ( [place] )   Then visited [place]   Has mild - mod CAD  Intolerant to statins  Zetia caused diarrhea   Has been prescribed Praluent ( still needing to be pre -authorized)   Also consider Bempadoic acid  ? Consider Livalo     Past Medical History:  Diagnosis Date   Anxiety    Arthritis    in her hands    Elevated cholesterol    Hair loss    Hemorrhoids    Hyperlipidemia    Hypothyroidism    on synthroid   IBS (irritable bowel  syndrome)    Lyme disease    Osteoporosis 03/2017   T score -3.2   Thyroid disease    hypothyroid    Past Surgical History:  Procedure Laterality Date   LAPAROSCOPIC SALPINGO OOPHERECTOMY Bilateral 10/29/2014   Procedure: LAPAROSCOPIC SALPINGO OOPHORECTOMY;  Surgeon: [name], MD;  Location: [facility];  Service: Gynecology;  Laterality: Bilateral;  1:00pm OR time requested  1 1/2 hours OR time requested.   SHOULDER SURGERY     TUBAL LIGATION      Current Medications: No outpatient medications have been marked as taking for the 03/28/22 encounter (Office Visit) with Nery Kalisz, Wonda Cheng, MD.     Allergies:   Hydromet [hydrocodone bit-homatrop mbr], Codeine, Ibuprofen, and Statins   Social History   Socioeconomic History   Marital status: Widowed    Spouse name: Not on file   Number of children: Not on file   Years of education: Not on file   Highest education level: Not on file  Occupational History   Not on file  Tobacco Use   Smoking status: Former  Types: Cigarettes    Quit date: 05/27/1991    Years since quitting: 30.8   Smokeless tobacco: Never  Vaping Use   Vaping Use: Never used  Substance and Sexual Activity   Alcohol use: Yes    Alcohol/week: 0.0 standard drinks of alcohol    Comment: rare   Drug use: No   Sexual activity: Not Currently    Birth control/protection: Post-menopausal    Comment: 1st intercourse 76 yo--Fewer than 5 partners  Other Topics Concern   Not on file  Social History Narrative   Not on file   Social Determinants of Health   Financial Resource Strain: Low Risk  (10/29/2020)   Overall Financial Resource Strain (CARDIA)    Difficulty of Paying Living Expenses: Not hard at all  Food Insecurity: No Food Insecurity (10/29/2020)   Hunger Vital Sign    Worried About Running Out of Food in the Last Year: Never true    Ran Out of Food in the Last Year: Never true  Transportation Needs: No Transportation Needs (10/29/2020)   PRAPARE -  Hydrologist (Medical): No    Lack of Transportation (Non-Medical): No  Physical Activity: Sufficiently Active (10/29/2020)   Exercise Vital Sign    Days of Exercise per Week: 7 days    Minutes of Exercise per Session: 30 min  Stress: No Stress Concern Present (10/29/2020)   Drain    Feeling of Stress : Not at all  Social Connections: Moderately Isolated (10/29/2020)   Social Connection and Isolation Panel [NHANES]    Frequency of Communication with Friends and Family: More than three times a week    Frequency of Social Gatherings with Friends and Family: More than three times a week    Attends Religious Services: Never    Marine scientist or Organizations: No    Attends Music therapist: Never    Marital Status: Living with partner     Family History: The patient's family history includes Alcohol abuse in her sister; Alzheimer's disease in her mother; Anorexia nervosa in her grandchild; Arthritis in her father, mother, sister, and sister; Cancer in her father and sister; Colitis in her son; Heart disease in her sister; Hyperlipidemia in her father; Osteoporosis in her father. There is no history of Colon cancer, Stomach cancer, Esophageal cancer, or Pancreatic cancer.  ROS:   Please see the history of present illness.     All other systems reviewed and are negative.  EKGs/Labs/Other Studies Reviewed:    The following studies were reviewed today:   EKG:    Recent Labs: 01/03/2022: Magnesium 2.1 01/04/2022: ALT 10; BUN 12; Creat 0.99; Hemoglobin 11.5; Platelets 266; Potassium 4.5; Sodium 139  Recent Lipid Panel    Component Value Date/Time   CHOL 262 (H) 01/15/2019 1007   TRIG 79 01/15/2019 1007   HDL 55 01/15/2019 1007   CHOLHDL 4.8 01/15/2019 1007   VLDL 14 10/13/2015 1219   LDLCALC 189 (H) 01/15/2019 1007     Risk Assessment/Calculations:                 Physical Exam:    Physical Exam: Blood pressure (!) 102/54, pulse (!) 56, height _0  (1.626 m), weight 119 lb 8 oz (54.2 kg), last menstrual period 05/23/1998, SpO2 98 %.       GEN:  Well nourished, well developed in no acute distress HEENT: Normal NECK: No JVD; No carotid  bruits LYMPHATICS: No lymphadenopathy CARDIAC: RRR , no murmurs, rubs, gallops RESPIRATORY:  Clear to auscultation without rales, wheezing or rhonchi  ABDOMEN: Soft, non-tender, non-distended MUSCULOSKELETAL:  No edema; No deformity  SKIN: Warm and dry NEUROLOGIC:  Alert and oriented x 3   ASSESSMENT:    No diagnosis found. PLAN:       Chest pain :   has mimimal CAD .  Non obstructive .   Will concentrate on risk factor reduction .  LDL goal is 55.  Dr. Posey Pronto in the lipid clinic is trying to get her approved for Pralulent .    Could consider bempdoic acid  ? Livalo ( she may have tried this previously )  Alternate day dosing of Atorva.  Will have her follw up with the lipid clinic    2.  Mild mitral regurgitation:    3.  Hyperlipidemia: She has an LDL of 189.  She has tried multiple statins but did not tolerate them.              Medication Adjustments/Labs and Tests Ordered: Current medicines are reviewed at length with the patient today.  Concerns regarding medicines are outlined above.  No orders of the defined types were placed in this encounter.  No orders of the defined types were placed in this encounter.   Patient Instructions  Medication Instructions:  Your physician recommends that you continue on your current medications as directed. Please refer to the Current Medication list given to you today.  *If you need a refill on your cardiac medications before your next appointment, please call your pharmacy*  Lab Work: NONE If you have labs (blood work) drawn today and your tests are completely normal, you will receive your results only by: [place] (if you have  MyChart) OR A paper copy in the mail If you have any lab test that is abnormal or we need to change your treatment, we will call you to review the results.  Testing/Procedures: NONE  Follow-Up: At [facility], you and your health needs are our priority.  As part of our continuing mission to provide you with exceptional heart care, we have created designated Provider Care Teams.  These Care Teams include your primary Cardiologist (physician) and Advanced Practice Providers (APPs -  Physician Assistants and Nurse Practitioners) who all work together to provide you with the care you need, when you need it.  Your next appointment:   6 month(s)  The format for your next appointment:   In Person  Provider:   Mertie Moores, MD    Important Information About Sugar         Signed, Mertie Moores, MD  03/28/2022 5:16 PM    [place]

## 2022-03-28 ENCOUNTER — Encounter: Payer: Self-pay | Admitting: Cardiovascular Disease

## 2022-03-28 ENCOUNTER — Ambulatory Visit: Payer: Medicare Other | Attending: Cardiovascular Disease | Admitting: Cardiovascular Disease

## 2022-03-28 VITALS — BP 102/54 | HR 56 | Ht 64.0 in | Wt 119.5 lb

## 2022-03-28 DIAGNOSIS — I2510 Atherosclerotic heart disease of native coronary artery without angina pectoris: Secondary | ICD-10-CM | POA: Insufficient documentation

## 2022-03-28 DIAGNOSIS — E7800 Pure hypercholesterolemia, unspecified: Secondary | ICD-10-CM | POA: Diagnosis not present

## 2022-03-28 DIAGNOSIS — I200 Unstable angina: Secondary | ICD-10-CM | POA: Diagnosis not present

## 2022-03-28 DIAGNOSIS — I25118 Atherosclerotic heart disease of native coronary artery with other forms of angina pectoris: Secondary | ICD-10-CM | POA: Insufficient documentation

## 2022-03-28 NOTE — Patient Instructions (Signed)
Medication Instructions:  Your physician recommends that you continue on your current medications as directed. Please refer to the Current Medication list given to you today.  *If you need a refill on your cardiac medications before your next appointment, please call your pharmacy*  Lab Work: NONE If you have labs (blood work) drawn today and your tests are completely normal, you will receive your results only by: [place] (if you have MyChart) OR A paper copy in the mail If you have any lab test that is abnormal or we need to change your treatment, we will call you to review the results.  Testing/Procedures: NONE  Follow-Up: At [facility], you and your health needs are our priority.  As part of our continuing mission to provide you with exceptional heart care, we have created designated Provider Care Teams.  These Care Teams include your primary Cardiologist (physician) and Advanced Practice Providers (APPs -  Physician Assistants and Nurse Practitioners) who all work together to provide you with the care you need, when you need it.  Your next appointment:   6 month(s)  The format for your next appointment:   In Person  Provider:   Mertie Moores, MD    Important Information About Sugar

## 2022-04-01 MED ORDER — PRAVASTATIN SODIUM 10 MG PO TABS
10.0000 mg | ORAL_TABLET | Freq: Every day | ORAL | 3 refills | Status: DC
Start: 2022-04-01 — End: 2022-12-13

## 2022-04-12 ENCOUNTER — Ambulatory Visit (INDEPENDENT_AMBULATORY_CARE_PROVIDER_SITE_OTHER): Payer: Medicare Other

## 2022-04-12 VITALS — Ht 64.0 in | Wt 119.0 lb

## 2022-04-12 DIAGNOSIS — Z0000 Encounter for general adult medical examination without abnormal findings: Secondary | ICD-10-CM

## 2022-04-12 NOTE — Patient Instructions (Signed)
Annette Barker , Thank you for taking time to come for your Medicare Wellness Visit. I appreciate your ongoing commitment to your health goals. Please review the following plan we discussed and let me know if I can assist you in the future.   These are the goals we discussed:  Goals      Exercise 3x per week (30 min per time)     Continue to walk and bike. "Have an as much fun as possible!"     Patient Stated     I would like to have my IBS controlled     Prevent falls        This is a list of the screening recommended for you and due dates:  Health Maintenance  Topic Date Due   Mammogram  07/15/2021   Flu Shot  04/20/2022*   Zoster (Shingles) Vaccine (1 of 2) 07/13/2022*   COVID-19 Vaccine (3 - 2023-24 season) 09/22/2022*   Pap Smear  01/20/2023*   Hepatitis C Screening: USPSTF Recommendation to screen - Ages 18-79 yo.  03/25/2023*   Pneumonia Vaccine (2 - PPSV23 or PCV20) 04/13/2023*   Medicare Annual Wellness Visit  04/13/2023   Colon Cancer Screening  02/21/2026   DEXA scan (bone density measurement)  Completed   HPV Vaccine  Aged Out  *Topic was postponed. The date shown is not the original due date.   Advanced directives: COPY IN CHART  Conditions/risks identified: Aim for 30 minutes of exercise or brisk walking, 6-8 glasses of water, and 5 servings of fruits and vegetables each day. KEEP UP THE GOOD WORK!!  Next appointment: Follow up in one year for your annual wellness visit 03/2023.    Preventive Care 62 Years and Older, Female Preventive care refers to lifestyle choices and visits with your health care provider that can promote health and wellness. What does preventive care include? A yearly physical exam. This is also called an annual well check. Dental exams once or twice a year. Routine eye exams. Ask your health care provider how often you should have your eyes checked. Personal lifestyle choices, including: Daily care of your teeth and gums. Regular  physical activity. Eating a healthy diet. Avoiding tobacco and drug use. Limiting alcohol use. Practicing safe sex. Taking low-dose aspirin every day. Taking vitamin and mineral supplements as recommended by your health care provider. What happens during an annual well check? The services and screenings done by your health care provider during your annual well check will depend on your age, overall health, lifestyle risk factors, and family history of disease. Counseling  Your health care provider may ask you questions about your: Alcohol use. Tobacco use. Drug use. Emotional well-being. Home and relationship well-being. Sexual activity. Eating habits. History of falls. Memory and ability to understand (cognition). Work and work Statistician. Reproductive health. Screening  You may have the following tests or measurements: Height, weight, and BMI. Blood pressure. Lipid and cholesterol levels. These may be checked every 5 years, or more frequently if you are over 41 years old. Skin check. Lung cancer screening. You may have this screening every year starting at age 95 if you have a 30-pack-year history of smoking and currently smoke or have quit within the past 15 years. Fecal occult blood test (FOBT) of the stool. You may have this test every year starting at age 69. Flexible sigmoidoscopy or colonoscopy. You may have a sigmoidoscopy every 5 years or a colonoscopy every 10 years starting at age 61. Hepatitis C blood  test. Hepatitis B blood test. Sexually transmitted disease (STD) testing. Diabetes screening. This is done by checking your blood sugar (glucose) after you have not eaten for a while (fasting). You may have this done every 1-3 years. Bone density scan. This is done to screen for osteoporosis. You may have this done starting at age 59. Mammogram. This may be done every 1-2 years. Talk to your health care provider about how often you should have regular mammograms. Talk  with your health care provider about your test results, treatment options, and if necessary, the need for more tests. Vaccines  Your health care provider may recommend certain vaccines, such as: Influenza vaccine. This is recommended every year. Tetanus, diphtheria, and acellular pertussis (Tdap, Td) vaccine. You may need a Td booster every 10 years. Zoster vaccine. You may need this after age 17. Pneumococcal 13-valent conjugate (PCV13) vaccine. One dose is recommended after age 52. Pneumococcal polysaccharide (PPSV23) vaccine. One dose is recommended after age 56. Talk to your health care provider about which screenings and vaccines you need and how often you need them. This information is not intended to replace advice given to you by your health care provider. Make sure you discuss any questions you have with your health care provider. Document Released: 06/05/2015 Document Revised: 01/27/2016 Document Reviewed: 03/10/2015 Elsevier Interactive Patient Education  2017 [place] Prevention in the Home Falls can cause injuries. They can happen to people of all ages. There are many things you can do to make your home safe and to help prevent falls. What can I do on the outside of my home? Regularly fix the edges of walkways and driveways and fix any cracks. Remove anything that might make you trip as you walk through a door, such as a raised step or threshold. Trim any bushes or trees on the path to your home. Use bright outdoor lighting. Clear any walking paths of anything that might make someone trip, such as rocks or tools. Regularly check to see if handrails are loose or broken. Make sure that both sides of any steps have handrails. Any raised decks and porches should have guardrails on the edges. Have any leaves, snow, or ice cleared regularly. Use sand or salt on walking paths during winter. Clean up any spills in your garage right away. This includes oil or grease  spills. What can I do in the bathroom? Use night lights. Install grab bars by the toilet and in the tub and shower. Do not use towel bars as grab bars. Use non-skid mats or decals in the tub or shower. If you need to sit down in the shower, use a plastic, non-slip stool. Keep the floor dry. Clean up any water that spills on the floor as soon as it happens. Remove soap buildup in the tub or shower regularly. Attach bath mats securely with double-sided non-slip rug tape. Do not have throw rugs and other things on the floor that can make you trip. What can I do in the bedroom? Use night lights. Make sure that you have a light by your bed that is easy to reach. Do not use any sheets or blankets that are too big for your bed. They should not hang down onto the floor. Have a firm chair that has side arms. You can use this for support while you get dressed. Do not have throw rugs and other things on the floor that can make you trip. What can I do in the kitchen? Clean  up any spills right away. Avoid walking on wet floors. Keep items that you use a lot in easy-to-reach places. If you need to reach something above you, use a strong step stool that has a grab bar. Keep electrical cords out of the way. Do not use floor polish or wax that makes floors slippery. If you must use wax, use non-skid floor wax. Do not have throw rugs and other things on the floor that can make you trip. What can I do with my stairs? Do not leave any items on the stairs. Make sure that there are handrails on both sides of the stairs and use them. Fix handrails that are broken or loose. Make sure that handrails are as long as the stairways. Check any carpeting to make sure that it is firmly attached to the stairs. Fix any carpet that is loose or worn. Avoid having throw rugs at the top or bottom of the stairs. If you do have throw rugs, attach them to the floor with carpet tape. Make sure that you have a light switch at the  top of the stairs and the bottom of the stairs. If you do not have them, ask someone to add them for you. What else can I do to help prevent falls? Wear shoes that: Do not have high heels. Have rubber bottoms. Are comfortable and fit you well. Are closed at the toe. Do not wear sandals. If you use a stepladder: Make sure that it is fully opened. Do not climb a closed stepladder. Make sure that both sides of the stepladder are locked into place. Ask someone to hold it for you, if possible. Clearly mark and make sure that you can see: Any grab bars or handrails. First and last steps. Where the edge of each step is. Use tools that help you move around (mobility aids) if they are needed. These include: Canes. Walkers. Scooters. Crutches. Turn on the lights when you go into a dark area. Replace any light bulbs as soon as they burn out. Set up your furniture so you have a clear path. Avoid moving your furniture around. If any of your floors are uneven, fix them. If there are any pets around you, be aware of where they are. Review your medicines with your doctor. Some medicines can make you feel dizzy. This can increase your chance of falling. Ask your doctor what other things that you can do to help prevent falls. This information is not intended to replace advice given to you by your health care provider. Make sure you discuss any questions you have with your health care provider. Document Released: 03/05/2009 Document Revised: 10/15/2015 Document Reviewed: 06/13/2014 Elsevier Interactive Patient Education  2017 [employer].

## 2022-04-12 NOTE — Progress Notes (Signed)
Subjective:   Annette Barker is a 76 y.o. female who presents for Medicare Annual (Subsequent) preventive examination. Virtual Visit via Telephone Note  I connected with  Annette Barker on 04/12/22 at  9:30 AM EST by telephone and verified that I am speaking with the correct person using two identifiers.  Location: Patient: HOME Provider: [facility] Persons participating in the virtual visit: patient/Nurse Health Advisor   I discussed the limitations, risks, security and privacy concerns of performing an evaluation and management service by telephone and the availability of in person appointments. The patient expressed understanding and agreed to proceed.  Interactive audio and video telecommunications were attempted between this nurse and patient, however failed, due to patient having technical difficulties OR patient did not have access to video capability.  We continued and completed visit with audio only.  Some vital signs may be absent or patient reported.   Chriss Driver, LPN  Review of Systems     Cardiac Risk Factors include: advanced age (>12mn, >>8women);sedentary lifestyle     Objective:    Today's Vitals   04/12/22 0929  Weight: 119 lb (54 kg)  Height: '5\' 4"'$  (1.626 m)   Body mass index is 20.43 kg/m.     04/12/2022    9:39 AM 10/29/2020    9:19 AM 10/21/2014   12:40 PM  Advanced Directives  Does Patient Have a Medical Advance Directive? Yes Yes Yes  Type of [place] Does patient want to make changes to medical advance directive? No - Patient declined No - Patient declined   Copy of [place] Chart? Yes - validated most recent copy scanned in chart (See row information) Yes - validated most recent copy scanned in chart (See row information) Yes    Current Medications (verified) Outpatient Encounter Medications as  of 04/12/2022  Medication Sig   B Complex Vitamins (VITAMIN B COMPLEX PO) Take 1 tablet by mouth every other day.   diazepam (VALIUM) 5 MG tablet TAKE 1 TABLET BY MOUTH EVERYDAY AT BEDTIME   diphenhydrAMINE HCl (BENADRYL PO) Take 1 tablet by mouth daily as needed.   Estradiol 0.25 MG/0.25GM GEL PLACE TOPICALLY ON THE LOWER ABDOMEN DAILY   ezetimibe (ZETIA) 10 MG tablet Take 1 tablet (10 mg total) by mouth daily.   levothyroxine (SYNTHROID) 75 MCG tablet Take 1 tablet (75 mcg total) by mouth daily before breakfast.   medroxyPROGESTERone (PROVERA) 2.5 MG tablet Take 1 tablet (2.5 mg total) by mouth daily.   Methylcellulose, Laxative, (CITRUCEL PO) Take by mouth daily in the afternoon.   pravastatin (PRAVACHOL) 10 MG tablet Take 1 tablet (10 mg total) by mouth daily.   predniSONE (DELTASONE) 1 MG tablet Take 1 mg by mouth daily with breakfast.   progesterone (PROMETRIUM) 100 MG capsule Take 1 capsule (100 mg total) by mouth daily.   triamcinolone cream (KENALOG) 0.1 % Apply 1 application topically 2 (two) times daily.   [DISCONTINUED] alendronate (FOSAMAX) 70 MG tablet Take 1 tablet (70 mg total) by mouth every 7 (seven) days. Take first thing in am with 6 oz. Water.  Be upright after taking.  Eat nothing for one hour. (Patient not taking: Reported on 01/10/2022)   [DISCONTINUED] Alirocumab (PRALUENT) 75 MG/ML SOAJ Inject 140 mg into the skin every 14 (fourteen) days. (Patient not taking: Reported on 04/12/2022)   No facility-administered encounter medications on file as  of 04/12/2022.    Allergies (verified) Hydromet [hydrocodone bit-homatrop mbr], Codeine, Ibuprofen, and Statins   History: Past Medical History:  Diagnosis Date   Anxiety    Arthritis    in her hands    Elevated cholesterol    Hair loss    Hemorrhoids    Hyperlipidemia    Hypothyroidism    on synthroid   IBS (irritable bowel syndrome)    Lyme disease    Osteoporosis 03/2017   T score -3.2   Thyroid disease     hypothyroid   Past Surgical History:  Procedure Laterality Date   LAPAROSCOPIC SALPINGO OOPHERECTOMY Bilateral 10/29/2014   Procedure: LAPAROSCOPIC SALPINGO OOPHORECTOMY;  Surgeon: [name], MD;  Location: [facility];  Service: Gynecology;  Laterality: Bilateral;  1:00pm OR time requested  1 1/2 hours OR time requested.   SHOULDER SURGERY     TUBAL LIGATION     Family History  Problem Relation Age of Onset   Alzheimer's disease Mother    Arthritis Mother    Osteoporosis Father    Cancer Father        leukemia   Arthritis Father    Hyperlipidemia Father    Cancer Sister        PERITONEAL CANCER   Arthritis Sister    Heart disease Sister    Arthritis Sister    Alcohol abuse Sister    Colitis Son    Anorexia nervosa Grandchild    Colon cancer Neg Hx    Stomach cancer Neg Hx    Esophageal cancer Neg Hx    Pancreatic cancer Neg Hx    Social History   Socioeconomic History   Marital status: Widowed    Spouse name: Not on file   Number of children: Not on file   Years of education: Not on file   Highest education level: Not on file  Occupational History   Not on file  Tobacco Use   Smoking status: Former    Types: Cigarettes    Quit date: 05/27/1991    Years since quitting: 30.8   Smokeless tobacco: Never  Vaping Use   Vaping Use: Never used  Substance and Sexual Activity   Alcohol use: Yes    Alcohol/week: 0.0 standard drinks of alcohol    Comment: rare   Drug use: No   Sexual activity: Not Currently    Birth control/protection: Post-menopausal    Comment: 1st intercourse 76 yo--Fewer than 5 partners  Other Topics Concern   Not on file  Social History Narrative   Not on file   Social Determinants of Health   Financial Resource Strain: Low Risk  (04/12/2022)   Overall Financial Resource Strain (CARDIA)    Difficulty of Paying Living Expenses: Not hard at all  Food Insecurity: No Food Insecurity (04/12/2022)   Hunger Vital Sign    Worried About Running  Out of Food in the Last Year: Never true    Ran Out of Food in the Last Year: Never true  Transportation Needs: No Transportation Needs (04/12/2022)   PRAPARE - Hydrologist (Medical): No    Lack of Transportation (Non-Medical): No  Physical Activity: Sufficiently Active (04/12/2022)   Exercise Vital Sign    Days of Exercise per Week: 7 days    Minutes of Exercise per Session: 30 min  Stress: No Stress Concern Present (04/12/2022)   [place]    Feeling of Stress :  Not at all  Social Connections: Socially Integrated (04/12/2022)   Social Connection and Isolation Panel [NHANES]    Frequency of Communication with Friends and Family: More than three times a week    Frequency of Social Gatherings with Friends and Family: More than three times a week    Attends Religious Services: More than 4 times per year    Active Member of [employer] or Organizations: Yes    Attends Archivist Meetings: 1 to 4 times per year    Marital Status: Married    Tobacco Counseling Counseling given: Not Answered   Clinical Intake:  Pre-visit preparation completed: Yes  Pain : No/denies pain     BMI - recorded: 20.43 Nutritional Status: BMI of 19-24  Normal Nutritional Risks: Other (Comment) Diabetes: No  How often do you need to have someone help you when you read instructions, pamphlets, or other written materials from your doctor or pharmacy?: 1 - Never  Diabetic?NO  Interpreter Needed?: No  Information entered by :: mj Jessie Cowher, lpn   Activities of Daily Living    04/12/2022    9:41 AM  In your present state of health, do you have any difficulty performing the following activities:  Hearing? 0  Vision? 0  Difficulty concentrating or making decisions? 0  Walking or climbing stairs? 0  Dressing or bathing? 0  Doing errands, shopping? 0  Preparing Food and eating ? N  Using the Toilet?  N  In the past six months, have you accidently leaked urine? N  Do you have problems with loss of bowel control? Y  Comment At times due to IBS  Managing your Medications? N  Managing your Finances? N  Housekeeping or managing your Housekeeping? N    Patient Care Team: Susy Frizzle, MD as PCP - General (Family Medicine)  Indicate any recent Medical Services you may have received from other than [facility] providers in the past year (date may be approximate).     Assessment:   This is a routine wellness examination for Milianna.  Hearing/Vision screen Hearing Screening - Comments:: No hearing issues. Vision Screening - Comments:: Readers.   Dietary issues and exercise activities discussed: Current Exercise Habits: Home exercise routine, Type of exercise: walking;strength training/weights;stretching, Time (Minutes): 30, Frequency (Times/Week): 5, Weekly Exercise (Minutes/Week): 150, Intensity: Mild, Exercise limited by: cardiac condition(s);orthopedic condition(s)   Goals Addressed             This Visit's Progress    Exercise 3x per week (30 min per time)       Continue to walk and bike. "Have an as much fun as possible!"     Patient Stated   Not on track    I would like to have my IBS controlled     Prevent falls   On track      Depression Screen    04/12/2022    9:33 AM 10/29/2020    9:25 AM 01/15/2019    9:25 AM 10/26/2017   11:22 AM 10/13/2015   11:45 AM  PHQ 2/9 Scores  PHQ - 2 Score 0 0 0 0 0    Fall Risk    04/12/2022    9:40 AM 10/29/2020    9:22 AM 04/17/2019    9:49 AM 12/24/2018    1:43 PM 10/26/2017   11:22 AM  Fall Risk   Falls in the past year? '1 1 1  '$ No  Comment   Emmi Telephone Survey: data to providers prior to  load Emmi Telephone Survey: data to providers prior to load   Number falls in past yr: 1 0 1    Comment   Emmi Telephone Survey Actual Response = 1 Emmi Telephone Survey Actual Response =    Injury with Fall? 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 Yes  If so, are there any without handrails? No  Home free of loose throw rugs in walkways, pet beds, electrical cords, etc? No  Adequate lighting in your home to reduce risk of falls? No   ASSISTIVE DEVICES UTILIZED TO PREVENT FALLS:  Life alert? No  Use of a cane, walker or w/c? No  Grab bars in the bathroom? No  Shower chair or bench in shower? No  Elevated toilet seat or a handicapped toilet? No   TIMED UP AND GO:  Was the test performed? No .  Cognitive Function:        04/12/2022    9:43 AM  6CIT Screen  What Year? 0 points  What month? 0 points  What time? 0 points  Count back from 20 0 points  Months in reverse 0 points  Repeat phrase 0 points  Total Score 0 points    Immunizations Immunization History  Administered Date(s) Administered   Fluad Quad(high Dose 65+) 01/15/2019, 03/19/2020   Influenza,inj,Quad PF,6+ Mos 02/10/2016, 03/17/2017, 04/18/2018   PFIZER(Purple Top)SARS-COV-2 Vaccination 06/30/2019, 07/24/2019   Pneumococcal Conjugate-13 01/15/2019   Tdap 05/23/2008    TDAP status: Due, Education has been provided regarding the importance of this vaccine. Advised may receive this vaccine at local pharmacy or Health Dept. Aware to provide a copy of the vaccination record if obtained from local pharmacy or Health Dept. Verbalized acceptance and understanding.  Flu Vaccine status: Due, Education has been provided regarding the importance of this vaccine. Advised may receive this vaccine at local pharmacy or Health Dept. Aware to provide a copy of the vaccination record if obtained from local pharmacy or Health Dept. Verbalized acceptance and understanding.  Pneumococcal  vaccine status: Up to date  Covid-19 vaccine status: Completed vaccines  Qualifies for Shingles Vaccine? Yes   Zostavax completed No   Shingrix Completed?: No.    Education has been provided regarding the importance of this vaccine. Patient has been advised to call insurance company to determine out of pocket expense if they have not yet received this vaccine. Advised may also receive vaccine at local pharmacy or Health Dept. Verbalized acceptance and understanding.  Screening Tests Health Maintenance  Topic Date Due   MAMMOGRAM  07/15/2021   INFLUENZA VACCINE  04/20/2022 (Originally 12/21/2021)   Zoster Vaccines- Shingrix (1 of 2) 07/13/2022 (Originally 11/23/1997)   COVID-19 Vaccine (3 - 2023-24 season) 09/22/2022 (Originally 01/21/2022)   PAP SMEAR-Modifier  01/20/2023 (Originally 06/11/2015)   Hepatitis C Screening  03/25/2023 (Originally 11/23/1963)   Pneumonia Vaccine 56+ Years old (2 - PPSV23 or PCV20) 04/13/2023 (Originally 03/12/2019)   Medicare Annual Wellness (AWV)  04/13/2023   COLONOSCOPY (Pts 45-37yr Insurance coverage will need to be confirmed)  02/21/2026   DEXA SCAN  Completed   HPV VACCINES  Aged Out    Health Maintenance  Health Maintenance  Due  Topic Date Due   MAMMOGRAM  07/15/2021    Colorectal cancer screening: Type of screening: Colonoscopy. Completed 02/22/2016. Repeat every 10 years  Mammogram status: Ordered 04/12/2022. Pt provided with contact info and advised to call to schedule appt.   Bone Density status: Completed 12/14/2021. Results reflect: Bone density results: OSTEOPOROSIS. Repeat every 2 years.  Lung Cancer Screening: (Low Dose CT Chest recommended if Age 53-80 years, 30 pack-year currently smoking OR have quit w/in 15years.) does not qualify.   Lung Cancer Screening Referral: N/A  Additional Screening:  Hepatitis C Screening: does qualify; Completed DUE  Vision Screening: Recommended annual ophthalmology exams for early detection of glaucoma  and other disorders of the eye. Is the patient up to date with their annual eye exam?  No  Who is the provider or what is the name of the office in which the patient attends annual eye exams? N/A If pt is not established with a provider, would they like to be referred to a provider to establish care? No .   Dental Screening: Recommended annual dental exams for proper oral hygiene  Community Resource Referral / Chronic Care Management: CRR required this visit?  No   CCM required this visit?  No      Plan:     I have personally reviewed and noted the following in the patient's chart:   Medical and social history Use of alcohol, tobacco or illicit drugs  Current medications and supplements including opioid prescriptions. Patient is not currently taking opioid prescriptions. Functional ability and status Nutritional status Physical activity Advanced directives List of other physicians Hospitalizations, surgeries, and ER visits in previous 12 months Vitals Screenings to include cognitive, depression, and falls Referrals and appointments  In addition, I have reviewed and discussed with patient certain preventive protocols, quality metrics, and best practice recommendations. A written personalized care plan for preventive services as well as general preventive health recommendations were provided to patient.     Chriss Driver, LPN   [id number]   Nurse Notes: Appointment made for 11/28 for flu and tdap vaccines. Mammogram ordered today and pt is aware.

## 2022-04-13 ENCOUNTER — Telehealth: Payer: Self-pay

## 2022-04-13 NOTE — Telephone Encounter (Signed)
  Prescription Request  04/13/2022  Is this a "Controlled Substance" medicine? Yes  LOV: 04/12/2022   What is the name of the medication or equipment? diazepam (VALIUM) 5 MG tablet [[id number]]   Have you contacted your pharmacy to request a refill? Yes   Which pharmacy would you like this sent to?  [id number] [place], [place] - [address]    Patient notified that their request is being sent to the clinical staff for review and that they should receive a response within 2 business days.   Please advise at [phone](mobile)

## 2022-04-18 ENCOUNTER — Encounter: Payer: Self-pay | Admitting: Family Medicine

## 2022-04-18 ENCOUNTER — Other Ambulatory Visit: Payer: Self-pay | Admitting: Family Medicine

## 2022-04-18 MED ORDER — DIAZEPAM 5 MG PO TABS
5.0000 mg | ORAL_TABLET | Freq: Every evening | ORAL | 0 refills | Status: DC | PRN
Start: 2022-04-18 — End: 2022-07-19

## 2022-04-18 NOTE — Telephone Encounter (Signed)
MY CHART MESSAGE FROM PATIENT:  Refill request was sent to [facility] on 04/13/22 but Rx was not sent. I will let patient know that you were out of town.   RF requested on Diazepam.  I called your office last Tuesday or Wednesday requesting you call in a refill for my prescription. [facility] but I don't think that they actually do. I have been out of this prescription for several days and have started really grinding my teeth again. I would appreciate your office calling this in today..ASAP please

## 2022-04-19 ENCOUNTER — Ambulatory Visit (INDEPENDENT_AMBULATORY_CARE_PROVIDER_SITE_OTHER): Payer: Medicare Other

## 2022-04-19 DIAGNOSIS — Z23 Encounter for immunization: Secondary | ICD-10-CM

## 2022-05-25 ENCOUNTER — Encounter: Payer: Self-pay | Admitting: Family Medicine

## 2022-06-17 ENCOUNTER — Encounter: Payer: Self-pay | Admitting: Cardiovascular Disease

## 2022-06-18 ENCOUNTER — Encounter: Payer: Self-pay | Admitting: Cardiovascular Disease

## 2022-06-20 DIAGNOSIS — Z6821 Body mass index (BMI) 21.0-21.9, adult: Secondary | ICD-10-CM | POA: Diagnosis not present

## 2022-06-20 DIAGNOSIS — Z7952 Long term (current) use of systemic steroids: Secondary | ICD-10-CM | POA: Diagnosis not present

## 2022-06-20 DIAGNOSIS — M1991 Primary osteoarthritis, unspecified site: Secondary | ICD-10-CM | POA: Diagnosis not present

## 2022-06-20 DIAGNOSIS — M353 Polymyalgia rheumatica: Secondary | ICD-10-CM | POA: Diagnosis not present

## 2022-06-21 DIAGNOSIS — H25013 Cortical age-related cataract, bilateral: Secondary | ICD-10-CM | POA: Diagnosis not present

## 2022-06-21 DIAGNOSIS — H353132 Nonexudative age-related macular degeneration, bilateral, intermediate dry stage: Secondary | ICD-10-CM | POA: Diagnosis not present

## 2022-06-21 DIAGNOSIS — H2513 Age-related nuclear cataract, bilateral: Secondary | ICD-10-CM | POA: Diagnosis not present

## 2022-06-26 ENCOUNTER — Encounter: Payer: Self-pay | Admitting: Family Medicine

## 2022-06-30 ENCOUNTER — Encounter: Payer: Self-pay | Admitting: Family Medicine

## 2022-07-01 ENCOUNTER — Other Ambulatory Visit: Payer: Self-pay

## 2022-07-01 ENCOUNTER — Telehealth: Payer: Self-pay

## 2022-07-01 DIAGNOSIS — E079 Disorder of thyroid, unspecified: Secondary | ICD-10-CM

## 2022-07-01 MED ORDER — LEVOTHYROXINE SODIUM 75 MCG PO TABS: 75.0000 ug | ORAL_TABLET | Freq: Every day | ORAL | 0 refills | Status: DC

## 2022-07-01 NOTE — Telephone Encounter (Signed)
Prescription Request  07/01/2022  Is this a "Controlled Substance" medicine? No  LOV: 04/19/22  What is the name of the medication or equipment? levothyroxine (SYNTHROID) 75 MCG tablet [id number]  Have you contacted your pharmacy to request a refill? No   Which pharmacy would you like this sent to?  [id number] [place], [place] - [address] Patient notified that their request is being sent to the clinical staff for review and that they should receive a response within 2 business days.   [phone]

## 2022-07-12 ENCOUNTER — Other Ambulatory Visit: Payer: Medicare Other

## 2022-07-18 NOTE — Telephone Encounter (Signed)
Patient called to follow up on refills; stated she spoke to the pharmacy the end of last week and they told her they haven't received the refill requests.  Refills needed for:  levothyroxine (SYNTHROID) 75 MCG tablet   diazepam (VALIUM) 5 MG tablet [id number]   [facility]:  [id number] [id number] [place]: [phone] Fax: [phone] #: [id number]  Please advise at [phone]

## 2022-07-19 ENCOUNTER — Other Ambulatory Visit: Payer: Self-pay | Admitting: Family Medicine

## 2022-07-19 ENCOUNTER — Other Ambulatory Visit: Payer: Self-pay

## 2022-07-19 DIAGNOSIS — E079 Disorder of thyroid, unspecified: Secondary | ICD-10-CM

## 2022-07-19 MED ORDER — DIAZEPAM 5 MG PO TABS: 5.0000 mg | ORAL_TABLET | Freq: Every evening | ORAL | 0 refills | Status: DC | PRN

## 2022-07-19 MED ORDER — LEVOTHYROXINE SODIUM 75 MCG PO TABS: 75.0000 ug | ORAL_TABLET | Freq: Every day | ORAL | 3 refills | Status: DC

## 2022-07-20 ENCOUNTER — Other Ambulatory Visit: Payer: Medicare Other

## 2022-07-20 DIAGNOSIS — R300 Dysuria: Secondary | ICD-10-CM

## 2022-07-20 DIAGNOSIS — E079 Disorder of thyroid, unspecified: Secondary | ICD-10-CM

## 2022-07-21 ENCOUNTER — Other Ambulatory Visit: Payer: Self-pay | Admitting: Family Medicine

## 2022-07-21 ENCOUNTER — Other Ambulatory Visit: Payer: Medicare Other

## 2022-07-21 LAB — URINALYSIS, ROUTINE W REFLEX MICROSCOPIC
Bilirubin Urine: NEGATIVE
Glucose, UA: NEGATIVE
Hgb urine dipstick: NEGATIVE
Ketones, ur: NEGATIVE
Nitrite: POSITIVE — AB
Protein, ur: NEGATIVE
RBC / HPF: NONE SEEN /HPF (ref 0–2)
Specific Gravity, Urine: 1.006 (ref 1.001–1.035)
pH: 6.5 (ref 5.0–8.0)

## 2022-07-21 LAB — MICROSCOPIC MESSAGE

## 2022-07-21 LAB — TSH: TSH: 1.26 m[IU]/L (ref 0.40–4.50)

## 2022-07-21 MED ORDER — CEPHALEXIN 500 MG PO CAPS: 500.0000 mg | ORAL_CAPSULE | Freq: Three times a day (TID) | ORAL | 0 refills | Status: DC

## 2022-07-22 LAB — URINE CULTURE
MICRO NUMBER:: 14626403
SPECIMEN QUALITY:: ADEQUATE

## 2022-07-25 ENCOUNTER — Other Ambulatory Visit: Payer: Self-pay | Admitting: Family Medicine

## 2022-07-25 MED ORDER — NITROFURANTOIN MONOHYD MACRO 100 MG PO CAPS: 100.0000 mg | ORAL_CAPSULE | Freq: Two times a day (BID) | ORAL | 0 refills | Status: DC

## 2022-08-03 ENCOUNTER — Encounter: Payer: Self-pay | Admitting: Family Medicine

## 2022-08-15 ENCOUNTER — Encounter: Payer: Self-pay | Admitting: Family Medicine

## 2022-08-16 DIAGNOSIS — D225 Melanocytic nevi of trunk: Secondary | ICD-10-CM | POA: Diagnosis not present

## 2022-08-16 DIAGNOSIS — L738 Other specified follicular disorders: Secondary | ICD-10-CM | POA: Diagnosis not present

## 2022-08-16 DIAGNOSIS — L821 Other seborrheic keratosis: Secondary | ICD-10-CM | POA: Diagnosis not present

## 2022-08-16 DIAGNOSIS — D485 Neoplasm of uncertain behavior of skin: Secondary | ICD-10-CM | POA: Diagnosis not present

## 2022-08-16 DIAGNOSIS — D1801 Hemangioma of skin and subcutaneous tissue: Secondary | ICD-10-CM | POA: Diagnosis not present

## 2022-08-22 ENCOUNTER — Encounter: Payer: Self-pay | Admitting: Family Medicine

## 2022-10-03 DIAGNOSIS — M25311 Other instability, right shoulder: Secondary | ICD-10-CM | POA: Diagnosis not present

## 2022-10-08 ENCOUNTER — Other Ambulatory Visit: Payer: Self-pay | Admitting: Family Medicine

## 2022-10-10 DIAGNOSIS — M25311 Other instability, right shoulder: Secondary | ICD-10-CM | POA: Diagnosis not present

## 2022-10-10 NOTE — Telephone Encounter (Signed)
Requested medication (s) are due for refill today -yes  Requested medication (s) are on the active medication list -yes  Future visit scheduled -no  Last refill: 07/19/22 #30   Notes to clinic: non delegated Rx  Requested Prescriptions  Pending Prescriptions Disp Refills   diazepam (VALIUM) 5 MG tablet [Pharmacy Med Name: DIAZEPAM 5 MG TABLET] 30 tablet 0    Sig: Take 1 tablet (5 mg total) by mouth at bedtime as needed for anxiety.     Not Delegated - Psychiatry: Anxiolytics/Hypnotics 2 Failed - 10/08/2022  6:09 PM      Failed - This refill cannot be delegated      Failed - Urine Drug Screen completed in last 360 days      Failed - Valid encounter within last 6 months    Recent Outpatient Visits           1 year ago Rhinosinusitis   [facility] Pickard, Priscille Heidelberg, MD   1 year ago Neuropathy   [facility] Donita Brooks, MD   2 years ago Concussion without loss of consciousness, initial encounter   [facility] Pickard, Priscille Heidelberg, MD   2 years ago Weight loss   [facility] Tanya Nones, Priscille Heidelberg, MD   2 years ago Pain of left clavicle   [facility] Donita Brooks, MD              Passed - Patient is not pregnant         Requested Prescriptions  Pending Prescriptions Disp Refills   diazepam (VALIUM) 5 MG tablet [Pharmacy Med Name: DIAZEPAM 5 MG TABLET] 30 tablet 0    Sig: Take 1 tablet (5 mg total) by mouth at bedtime as needed for anxiety.     Not Delegated - Psychiatry: Anxiolytics/Hypnotics 2 Failed - 10/08/2022  6:09 PM      Failed - This refill cannot be delegated      Failed - Urine Drug Screen completed in last 360 days      Failed - Valid encounter within last 6 months    Recent Outpatient Visits           1 year ago Rhinosinusitis   [facility] Pickard, Priscille Heidelberg, MD   1 year ago Neuropathy   [facility] Donita Brooks, MD   2  years ago Concussion without loss of consciousness, initial encounter   [facility] Donita Brooks, MD   2 years ago Weight loss   [facility] Tanya Nones Priscille Heidelberg, MD   2 years ago Pain of left clavicle   [facility] Pickard, Priscille Heidelberg, MD              Passed - Patient is not pregnant

## 2022-10-13 ENCOUNTER — Encounter: Payer: Self-pay | Admitting: Family Medicine

## 2022-10-14 ENCOUNTER — Ambulatory Visit (INDEPENDENT_AMBULATORY_CARE_PROVIDER_SITE_OTHER): Payer: Medicare Other | Admitting: Family Medicine

## 2022-10-14 ENCOUNTER — Ambulatory Visit: Payer: Medicare Other | Admitting: Family Medicine

## 2022-10-14 ENCOUNTER — Encounter: Payer: Self-pay | Admitting: Family Medicine

## 2022-10-14 VITALS — BP 110/64 | HR 66 | Temp 98.3°F | Ht 64.0 in | Wt 123.6 lb

## 2022-10-14 DIAGNOSIS — Z124 Encounter for screening for malignant neoplasm of cervix: Secondary | ICD-10-CM

## 2022-10-14 DIAGNOSIS — M2550 Pain in unspecified joint: Secondary | ICD-10-CM | POA: Diagnosis not present

## 2022-10-14 LAB — CBC WITH DIFFERENTIAL/PLATELET
Basophils Relative: 0.8 %
Eosinophils Absolute: 120 cells/uL (ref 15–500)
Eosinophils Relative: 1.6 %
Hemoglobin: 11.2 g/dL — ABNORMAL LOW (ref 11.7–15.5)
MPV: 9.7 fL (ref 7.5–12.5)
Monocytes Relative: 6.8 %
Neutro Abs: 4928 cells/uL (ref 1500–7800)
Neutrophils Relative %: 65.7 %

## 2022-10-14 NOTE — Progress Notes (Signed)
Subjective:    Patient ID: Annette Barker, female    DOB: 04/21/1946, 77 y.o.   MRN: [id number]  Patient has a history of polymyalgia rheumatica.  She is on very low-dose prednisone.  She also has a remote history of Lyme disease.  Recently she has been developing more pain in the joints of her body.  She states that her shoulder became dislocated recently and after this was repaired she has noticed pain in both shoulders, pain in the MCP and PIP joints on both hands as well as bilateral knee pain.  She is no longer taking a statin.  She denies any fevers or chills or rashes.  Today on examination the hand is not swollen or red and there is no sign of active synovitis.  She denies any pain in her hips or in her lower back.  She denies any symptoms of temporal arteritis. Past Medical History:  Diagnosis Date   Anxiety    Arthritis    in her hands    Elevated cholesterol    Hair loss    Hemorrhoids    Hyperlipidemia    Hypothyroidism    on synthroid   IBS (irritable bowel syndrome)    Lyme disease    Osteoporosis 03/2017   T score -3.2   Thyroid disease    hypothyroid   Past Surgical History:  Procedure Laterality Date   LAPAROSCOPIC SALPINGO OOPHERECTOMY Bilateral 10/29/2014   Procedure: LAPAROSCOPIC SALPINGO OOPHORECTOMY;  Surgeon: Dara Lords, MD;  Location: [facility];  Service: Gynecology;  Laterality: Bilateral;  1:00pm OR time requested  1 1/2 hours OR time requested.   SHOULDER SURGERY     TUBAL LIGATION     Current Outpatient Medications on File Prior to Visit  Medication Sig Dispense Refill   B Complex Vitamins (VITAMIN B COMPLEX PO) Take 1 tablet by mouth every other day.     diazepam (VALIUM) 5 MG tablet TAKE 1 TABLET (5 MG TOTAL) BY MOUTH AT BEDTIME AS NEEDED FOR ANXIETY 30 tablet 0   diphenhydrAMINE HCl (BENADRYL PO) Take 1 tablet by mouth daily as needed.     Estradiol 0.25 MG/0.25GM GEL PLACE TOPICALLY ON THE LOWER ABDOMEN DAILY 90 each 2   levothyroxine  (SYNTHROID) 75 MCG tablet Take 1 tablet (75 mcg total) by mouth daily before breakfast. 30 tablet 3   medroxyPROGESTERone (PROVERA) 2.5 MG tablet Take 1 tablet (2.5 mg total) by mouth daily. 90 tablet 3   Methylcellulose, Laxative, (CITRUCEL PO) Take by mouth daily in the afternoon.     predniSONE (DELTASONE) 1 MG tablet Take 1 mg by mouth daily with breakfast.     triamcinolone cream (KENALOG) 0.1 % Apply 1 application topically 2 (two) times daily. 30 g 0   pravastatin (PRAVACHOL) 10 MG tablet Take 1 tablet (10 mg total) by mouth daily. (Patient not taking: Reported on 10/14/2022) 90 tablet 3   No current facility-administered medications on file prior to visit.   Allergies  Allergen Reactions   Hydromet [Hydrocodone Bit-Homatrop Mbr] Itching and Swelling   Codeine Swelling   Ibuprofen     Other reaction(s): reflux   Statins Other (See Comments)    Body cramps all over body   Social History   Socioeconomic History   Marital status: Widowed    Spouse name: Not on file   Number of children: Not on file   Years of education: Not on file   Highest education level: Not on file  Occupational History  Not on file  Tobacco Use   Smoking status: Former    Types: Cigarettes    Quit date: 05/27/1991    Years since quitting: 31.4   Smokeless tobacco: Never  Vaping Use   Vaping Use: Never used  Substance and Sexual Activity   Alcohol use: Yes    Alcohol/week: 0.0 standard drinks of alcohol    Comment: rare   Drug use: No   Sexual activity: Not Currently    Birth control/protection: Post-menopausal    Comment: 1st intercourse 77 yo--Fewer than 5 partners  Other Topics Concern   Not on file  Social History Narrative   Not on file   Social Determinants of Health   Financial Resource Strain: Low Risk  (04/12/2022)   Overall Financial Resource Strain (CARDIA)    Difficulty of Paying Living Expenses: Not hard at all  Food Insecurity: No Food Insecurity (04/12/2022)   Hunger Vital  Sign    Worried About Running Out of Food in the Last Year: Never true    Ran Out of Food in the Last Year: Never true  Transportation Needs: No Transportation Needs (04/12/2022)   PRAPARE - Administrator, Civil Service (Medical): No    Lack of Transportation (Non-Medical): No  Physical Activity: Sufficiently Active (04/12/2022)   Exercise Vital Sign    Days of Exercise per Week: 7 days    Minutes of Exercise per Session: 30 min  Stress: No Stress Concern Present (04/12/2022)   [employer] of Occupational Health - Occupational Stress Questionnaire    Feeling of Stress : Not at all  Social Connections: Socially Integrated (04/12/2022)   Social Connection and Isolation Panel [NHANES]    Frequency of Communication with Friends and Family: More than three times a week    Frequency of Social Gatherings with Friends and Family: More than three times a week    Attends Religious Services: More than 4 times per year    Active Member of [employer] or Organizations: Yes    Attends Banker Meetings: 1 to 4 times per year    Marital Status: Married  Catering manager Violence: Not At Risk (04/12/2022)   Humiliation, Afraid, Rape, and Kick questionnaire    Fear of Current or Ex-Partner: No    Emotionally Abused: No    Physically Abused: No    Sexually Abused: No      Review of Systems  Neurological:  Positive for dizziness.  All other systems reviewed and are negative.      Objective:   Physical Exam Vitals reviewed.  Constitutional:      General: She is not in acute distress.    Appearance: Normal appearance. She is not ill-appearing, toxic-appearing or diaphoretic.  HENT:     Head: Normocephalic and atraumatic.  Neck:     Vascular: No carotid bruit.  Cardiovascular:     Rate and Rhythm: Normal rate and regular rhythm.     Pulses: Normal pulses.     Heart sounds: Normal heart sounds. No murmur heard.    No friction rub. No gallop.  Pulmonary:      Effort: Pulmonary effort is normal. No respiratory distress.     Breath sounds: Normal breath sounds. No stridor. No wheezing, rhonchi or rales.  Chest:     Chest wall: No tenderness.  Musculoskeletal:     Right shoulder: Tenderness present.     Left shoulder: Tenderness present.     Right hand: Tenderness present. No swelling or deformity.  Left hand: Tenderness present. No swelling or deformity.     Right lower leg: No edema.     Left lower leg: No edema.     Right foot: Normal range of motion. No deformity, bunion, Charcot foot or prominent metatarsal heads.     Left foot: Normal range of motion. No deformity, bunion, Charcot foot or prominent metatarsal heads.  Feet:     Right foot:     Skin integrity: Skin integrity normal.     Left foot:     Skin integrity: Skin integrity normal.  Neurological:     General: No focal deficit present.     Mental Status: She is alert and oriented to person, place, and time. Mental status is at baseline.     Motor: No weakness.     Coordination: Coordination normal.           Assessment & Plan:  Polyarthralgia - Plan: CBC with Differential/Platelet, COMPLETE METABOLIC PANEL WITH GFR, Sedimentation rate, Rheumatoid factor, C-reactive protein  Cervical cancer screening - Plan: Ambulatory referral to Gynecology  I suspect that the polyarthralgia is likely osteoarthritis however I will check a sedimentation rate to evaluate for possible exacerbation of PMR.  Given the fact that the hip joints are spared I do not feel that this is PMR.  I will check a CRP along with a sed rate to see if there is any evidence of an autoimmune disease.  Check a rheumatoid factor to evaluate for rheumatoid arthritis and check a CBC to evaluate for leukocytosis to suggest an underlying bone marrow abnormality.  If lab work is normal, I would like to treat the patient as osteoarthritis.  The patient would like a referral to a gynecologist for her well woman exam.  Her  most recent gynecologist retired

## 2022-10-15 LAB — COMPLETE METABOLIC PANEL WITH GFR
AG Ratio: 1.4 (calc) (ref 1.0–2.5)
ALT: 11 U/L (ref 6–29)
AST: 13 U/L (ref 10–35)
Albumin: 4 g/dL (ref 3.6–5.1)
Alkaline phosphatase (APISO): 67 U/L (ref 37–153)
BUN: 20 mg/dL (ref 7–25)
CO2: 26 mmol/L (ref 20–32)
Calcium: 9.2 mg/dL (ref 8.6–10.4)
Chloride: 100 mmol/L (ref 98–110)
Creat: 0.93 mg/dL (ref 0.60–1.00)
Globulin: 2.8 g/dL (calc) (ref 1.9–3.7)
Glucose, Bld: 119 mg/dL — ABNORMAL HIGH (ref 65–99)
Potassium: 4.8 mmol/L (ref 3.5–5.3)
Sodium: 136 mmol/L (ref 135–146)
Total Bilirubin: 0.3 mg/dL (ref 0.2–1.2)
Total Protein: 6.8 g/dL (ref 6.1–8.1)
eGFR: 64 mL/min/{1.73_m2} (ref >=60)

## 2022-10-15 LAB — CBC WITH DIFFERENTIAL/PLATELET
Absolute Monocytes: 510 cells/uL (ref 200–950)
Basophils Absolute: 60 cells/uL (ref 0–200)
HCT: 34.3 % — ABNORMAL LOW (ref 35.0–45.0)
Lymphs Abs: 1883 cells/uL (ref 850–3900)
MCH: 32.1 pg (ref 27.0–33.0)
MCHC: 32.7 g/dL (ref 32.0–36.0)
MCV: 98.3 fL (ref 80.0–100.0)
Platelets: 284 10*3/uL (ref 140–400)
RBC: 3.49 10*6/uL — ABNORMAL LOW (ref 3.80–5.10)
RDW: 12.9 % (ref 11.0–15.0)
Total Lymphocyte: 25.1 %
WBC: 7.5 10*3/uL (ref 3.8–10.8)

## 2022-10-15 LAB — C-REACTIVE PROTEIN: CRP: <3 mg/L (ref <8.0)

## 2022-10-15 LAB — SEDIMENTATION RATE: Sed Rate: 9 mm/h (ref 0–30)

## 2022-10-15 LAB — RHEUMATOID FACTOR: Rheumatoid fact SerPl-aCnc: <10 IU/mL (ref <14)

## 2022-10-18 DIAGNOSIS — M25311 Other instability, right shoulder: Secondary | ICD-10-CM | POA: Diagnosis not present

## 2022-11-01 DIAGNOSIS — M25311 Other instability, right shoulder: Secondary | ICD-10-CM | POA: Diagnosis not present

## 2022-11-06 ENCOUNTER — Encounter: Payer: Self-pay | Admitting: Family Medicine

## 2022-11-08 ENCOUNTER — Ambulatory Visit: Payer: Medicare Other | Admitting: Family Medicine

## 2022-11-08 ENCOUNTER — Ambulatory Visit (INDEPENDENT_AMBULATORY_CARE_PROVIDER_SITE_OTHER): Payer: Medicare Other | Admitting: Family Medicine

## 2022-11-08 ENCOUNTER — Encounter: Payer: Self-pay | Admitting: Family Medicine

## 2022-11-08 VITALS — BP 118/60 | HR 78 | Temp 99.1°F | Ht 64.0 in | Wt 122.4 lb

## 2022-11-08 DIAGNOSIS — J40 Bronchitis, not specified as acute or chronic: Secondary | ICD-10-CM | POA: Diagnosis not present

## 2022-11-08 MED ORDER — AMOXICILLIN-POT CLAVULANATE 875-125 MG PO TABS
1.0000 | ORAL_TABLET | Freq: Two times a day (BID) | ORAL | 0 refills | Status: DC
Start: 2022-11-08 — End: 2022-12-13

## 2022-11-08 NOTE — Progress Notes (Signed)
Subjective:    Patient ID: Annette Barker, female    DOB: February 19, 1946, 77 y.o.   MRN: [id number]  Patient flew to [place] for her grandsons graduation.  9 days ago she contracted a virus.  She states that she has been having "coughing fits so severe she will vomit".  Cough is productive of lot of brown sputum.  She has been reporting fevers up to 101.  She has had 3 negative COVID test.  She also reports sinus pressure Past Medical History:  Diagnosis Date   Anxiety    Arthritis    in her hands    Elevated cholesterol    Hair loss    Hemorrhoids    Hyperlipidemia    Hypothyroidism    on synthroid   IBS (irritable bowel syndrome)    Lyme disease    Osteoporosis 03/2017   T score -3.2   Thyroid disease    hypothyroid   Past Surgical History:  Procedure Laterality Date   LAPAROSCOPIC SALPINGO OOPHERECTOMY Bilateral 10/29/2014   Procedure: LAPAROSCOPIC SALPINGO OOPHORECTOMY;  Surgeon: Dara Lords, MD;  Location: [facility];  Service: Gynecology;  Laterality: Bilateral;  1:00pm OR time requested  1 1/2 hours OR time requested.   SHOULDER SURGERY     TUBAL LIGATION     Current Outpatient Medications on File Prior to Visit  Medication Sig Dispense Refill   B Complex Vitamins (VITAMIN B COMPLEX PO) Take 1 tablet by mouth every other day.     diazepam (VALIUM) 5 MG tablet TAKE 1 TABLET (5 MG TOTAL) BY MOUTH AT BEDTIME AS NEEDED FOR ANXIETY 30 tablet 0   diphenhydrAMINE HCl (BENADRYL PO) Take 1 tablet by mouth daily as needed.     Estradiol 0.25 MG/0.25GM GEL PLACE TOPICALLY ON THE LOWER ABDOMEN DAILY 90 each 2   levothyroxine (SYNTHROID) 75 MCG tablet Take 1 tablet (75 mcg total) by mouth daily before breakfast. 30 tablet 3   medroxyPROGESTERone (PROVERA) 2.5 MG tablet Take 1 tablet (2.5 mg total) by mouth daily. 90 tablet 3   Methylcellulose, Laxative, (CITRUCEL PO) Take by mouth daily in the afternoon.     pravastatin (PRAVACHOL) 10 MG tablet Take 1 tablet (10 mg total) by  mouth daily. (Patient not taking: Reported on 10/14/2022) 90 tablet 3   predniSONE (DELTASONE) 1 MG tablet Take 1 mg by mouth daily with breakfast.     triamcinolone cream (KENALOG) 0.1 % Apply 1 application topically 2 (two) times daily. 30 g 0   No current facility-administered medications on file prior to visit.   Allergies  Allergen Reactions   Hydromet [Hydrocodone Bit-Homatrop Mbr] Itching and Swelling   Codeine Swelling   Ibuprofen     Other reaction(s): reflux   Statins Other (See Comments)    Body cramps all over body   Social History   Socioeconomic History   Marital status: Widowed    Spouse name: Not on file   Number of children: Not on file   Years of education: Not on file   Highest education level: Not on file  Occupational History   Not on file  Tobacco Use   Smoking status: Former    Types: Cigarettes    Quit date: 05/27/1991    Years since quitting: 31.4   Smokeless tobacco: Never  Vaping Use   Vaping Use: Never used  Substance and Sexual Activity   Alcohol use: Yes    Alcohol/week: 0.0 standard drinks of alcohol    Comment: rare  Drug use: No   Sexual activity: Not Currently    Birth control/protection: Post-menopausal    Comment: 1st intercourse 77 yo--Fewer than 5 partners  Other Topics Concern   Not on file  Social History Narrative   Not on file   Social Determinants of Health   Financial Resource Strain: Low Risk  (04/12/2022)   Overall Financial Resource Strain (CARDIA)    Difficulty of Paying Living Expenses: Not hard at all  Food Insecurity: No Food Insecurity (04/12/2022)   Hunger Vital Sign    Worried About Running Out of Food in the Last Year: Never true    Ran Out of Food in the Last Year: Never true  Transportation Needs: No Transportation Needs (04/12/2022)   PRAPARE - Administrator, Civil Service (Medical): No    Lack of Transportation (Non-Medical): No  Physical Activity: Sufficiently Active (04/12/2022)    Exercise Vital Sign    Days of Exercise per Week: 7 days    Minutes of Exercise per Session: 30 min  Stress: No Stress Concern Present (04/12/2022)   [employer] of Occupational Health - Occupational Stress Questionnaire    Feeling of Stress : Not at all  Social Connections: Socially Integrated (04/12/2022)   Social Connection and Isolation Panel [NHANES]    Frequency of Communication with Friends and Family: More than three times a week    Frequency of Social Gatherings with Friends and Family: More than three times a week    Attends Religious Services: More than 4 times per year    Active Member of [employer] or Organizations: Yes    Attends Banker Meetings: 1 to 4 times per year    Marital Status: Married  Catering manager Violence: Not At Risk (04/12/2022)   Humiliation, Afraid, Rape, and Kick questionnaire    Fear of Current or Ex-Partner: No    Emotionally Abused: No    Physically Abused: No    Sexually Abused: No      Review of Systems  All other systems reviewed and are negative.      Objective:   Physical Exam Vitals reviewed.  Constitutional:      General: She is not in acute distress.    Appearance: Normal appearance. She is not ill-appearing, toxic-appearing or diaphoretic.  HENT:     Head: Normocephalic and atraumatic.  Neck:     Vascular: No carotid bruit.  Cardiovascular:     Rate and Rhythm: Normal rate and regular rhythm.     Pulses: Normal pulses.     Heart sounds: Normal heart sounds. No murmur heard.    No friction rub. No gallop.  Pulmonary:     Effort: Pulmonary effort is normal. No respiratory distress.     Breath sounds: No stridor. Rhonchi and rales present. No wheezing.  Chest:     Chest wall: No tenderness.  Musculoskeletal:     Right lower leg: No edema.     Left lower leg: No edema.     Right foot: Normal range of motion. No deformity, bunion, Charcot foot or prominent metatarsal heads.     Left foot: Normal range of  motion. No deformity, bunion, Charcot foot or prominent metatarsal heads.  Feet:     Right foot:     Skin integrity: Skin integrity normal.     Left foot:     Skin integrity: Skin integrity normal.  Neurological:     General: No focal deficit present.     Mental Status:  She is alert and oriented to person, place, and time. Mental status is at baseline.     Motor: No weakness.     Coordination: Coordination normal.     Patient has rhonchi and rales in the left lung      Assessment & Plan:  Bronchitis Patient has bronchitis and possibly early community-acquired pneumonia in the left lung.  Begin Augmentin 875 mg twice daily for 10 days.  Recheck immediately if worsening and get chest x-ray

## 2022-11-12 ENCOUNTER — Other Ambulatory Visit: Payer: Self-pay | Admitting: Obstetrics and Gynecology

## 2022-11-12 DIAGNOSIS — Z79890 Hormone replacement therapy (postmenopausal): Secondary | ICD-10-CM

## 2022-11-14 ENCOUNTER — Other Ambulatory Visit: Payer: Self-pay

## 2022-11-14 DIAGNOSIS — M25311 Other instability, right shoulder: Secondary | ICD-10-CM | POA: Diagnosis not present

## 2022-11-14 DIAGNOSIS — Z79890 Hormone replacement therapy (postmenopausal): Secondary | ICD-10-CM

## 2022-11-14 NOTE — Telephone Encounter (Signed)
3 month supply sent. Please set her up with the new provider that is starting in 8/24.

## 2022-11-14 NOTE — Telephone Encounter (Signed)
Med refill request: Estradiol Gel  Per TG note: Pt LVM in triage line stating that she is out of estrogen and has been out for a bit. States that her provider is retired and feels as if she has been through 4 providers in the past 7 years and desires to schedule with a provider that has plans to be in practice for a while.    Last AEX 11/03/2021--recall has been sent for pt to schedule appt in 10/2022.    Last mammo 07/15/2020-neg birads 1 (Last report received in EMR)

## 2022-11-14 NOTE — Telephone Encounter (Signed)
Pt LVM in triage line stating that she is out of estrogen and has been out for a bit. States that her provider is retired and feels as if she has been through 4 providers in the past 7 years and desires to schedule with a provider that has plans to be in practice for a while.   Last AEX 11/03/2021--recall has been sent for pt to schedule appt in 10/2022.   Last mammo 07/15/2020-neg birads 1 (Last report received in EMR)   Pt notified that she is overdue for AEX and will need to schedule. Msg sent to appt desk.  Pt also notified of last mammo we have on file and inquired if she has had a more recent one and she wasn't sure. Stated that when she tried to call and schedule, she was told that medicare did not cover mammo's on a yearly basis.

## 2022-11-15 ENCOUNTER — Telehealth: Payer: Self-pay

## 2022-11-15 NOTE — Telephone Encounter (Signed)
Pt notified and voiced understanding. Will close encounter.  

## 2022-11-15 NOTE — Telephone Encounter (Signed)
Estrogen was sent in yesterday and she shouldn't be out of progesterone prior to her visit in 7/24.

## 2022-11-15 NOTE — Telephone Encounter (Signed)
Pt now scheduled for 39yr med check w/ TW on 12/13/2022.

## 2022-11-15 NOTE — Telephone Encounter (Signed)
Prescription Request  11/15/2022  LOV: 11/08/22  What is the name of the medication or equipment? levothyroxine (SYNTHROID) 75 MCG  Have you contacted your pharmacy to request a refill? Yes   Which pharmacy would you like this sent to?  [id number] [place] [place] - [address] AT [facility] [address] [place] [address] Phone: [phone]  Fax: [phone]    Patient notified that their request is being sent to the clinical staff for review and that they should receive a response within 2 business days.   Please advise at [facility] [phone]

## 2022-11-15 NOTE — Telephone Encounter (Signed)
Pt is scheduled to see TW on 12/13/2022. Advised to let us know when she gets scheduled for her mammo. Pt voiced understanding. Will close encounter.

## 2022-11-16 ENCOUNTER — Other Ambulatory Visit: Payer: Self-pay

## 2022-11-16 DIAGNOSIS — E079 Disorder of thyroid, unspecified: Secondary | ICD-10-CM

## 2022-11-16 MED ORDER — LEVOTHYROXINE SODIUM 75 MCG PO TABS
75.0000 ug | ORAL_TABLET | Freq: Every day | ORAL | 3 refills | Status: DC
Start: 2022-11-16 — End: 2023-02-07

## 2022-11-17 ENCOUNTER — Telehealth: Payer: Self-pay | Admitting: Pulmonary Disease

## 2022-11-17 NOTE — Telephone Encounter (Signed)
PT calling because she had to recently cancel  her CT scan because she had an upper resp infection that went into pneumonia.  Upon rescheduling she wanted to be sure this was a medicare approved/covered procedure. Please call @ [phone]

## 2022-11-17 NOTE — Telephone Encounter (Signed)
Called patient and she wasn't sure when sh should have her ct schedule due to having pnemonia

## 2022-11-17 NOTE — Telephone Encounter (Signed)
Closed in error.

## 2022-11-23 ENCOUNTER — Ambulatory Visit (HOSPITAL_COMMUNITY): Payer: Medicare Other

## 2022-11-30 ENCOUNTER — Encounter: Payer: Self-pay | Admitting: Family Medicine

## 2022-12-06 DIAGNOSIS — Z1231 Encounter for screening mammogram for malignant neoplasm of breast: Secondary | ICD-10-CM | POA: Diagnosis not present

## 2022-12-06 LAB — HM MAMMOGRAPHY

## 2022-12-07 ENCOUNTER — Encounter: Payer: Self-pay | Admitting: Obstetrics and Gynecology

## 2022-12-13 ENCOUNTER — Ambulatory Visit (INDEPENDENT_AMBULATORY_CARE_PROVIDER_SITE_OTHER): Payer: Medicare Other | Admitting: Nurse Practitioner

## 2022-12-13 ENCOUNTER — Encounter: Payer: Self-pay | Admitting: Nurse Practitioner

## 2022-12-13 VITALS — BP 112/64 | HR 61 | Wt 121.0 lb

## 2022-12-13 DIAGNOSIS — M810 Age-related osteoporosis without current pathological fracture: Secondary | ICD-10-CM | POA: Diagnosis not present

## 2022-12-13 DIAGNOSIS — Z79890 Hormone replacement therapy (postmenopausal): Secondary | ICD-10-CM | POA: Diagnosis not present

## 2022-12-13 LAB — HM MAMMOGRAPHY

## 2022-12-13 MED ORDER — ESTRADIOL 0.25 MG/0.25GM TD GEL: TRANSDERMAL | 2 refills | Status: DC

## 2022-12-13 MED ORDER — MEDROXYPROGESTERONE ACETATE 2.5 MG PO TABS: 2.5000 mg | ORAL_TABLET | Freq: Every day | ORAL | 3 refills | Status: DC

## 2022-12-13 NOTE — Progress Notes (Signed)
   Annette Barker August 28, 1945 [id number]   History:  77 y.o. [id number] presents for medication management. On HRT - estradiol gel 0.25% nightly, provera 2.5 mg daily. Osteoporosis. T-score -3.0 July 2023. Was on Fosamax 2019-2021, discontinued by PCP per patient. Discussed management with Dr. Oscar La last year with recommendations to restart Fosamax but she does not want to start at this time. Plans to optimize Vit D, calcium and exercise.   Gynecologic History Patient's last menstrual period was 05/23/1998.   Contraception: post menopausal status  Health Maintenance Last Pap: 06/10/2014. Results were: Normal Last mammogram: 12/06/2022. Results were: Additional imaging needed due to inadequate picture. Appt today Last colonoscopy: 02/16/2016. Results were: Normal, 5-year recall Last Dexa: 12/14/2021. Results were: T-score -3.0 (-2.6 in 2021)  Past medical history, past surgical history, family history and social history were all reviewed and documented in the EPIC chart.  ROS:  A ROS was performed and pertinent positives and negatives are included.  Exam:  Vitals:   12/13/22 1158  BP: 112/64  Pulse: 61  SpO2: 100%  Weight: 121 lb (54.9 kg)   Body mass index is 20.77 kg/m.  General appearance:  Normal  Assessment/Plan:  77 y.o. [id number] for medication management.   Postmenopausal hormone therapy - Plan: medroxyPROGESTERone (PROVERA) 2.5 MG tablet daily, Estradiol 0.25 MG/0.25GM GEL nightly. Doing well on this and wants to continue. Aware of risks with continued use. UTD on mammogram.   Age-related osteoporosis without current pathological fracture - T-score -3.0 in July 2023. Was on Fosamax ~2019-2021, discontinued by PCP per patient. Discussed management with Dr. Oscar La last year with recommendations to restart Fosamax but she does not want to start at this time. Plans to optimize Vit D, calcium and exercise. Will repeat DXA next year. Educated on fracture risk and fall prevention.    Return in 1 year for breast and pelvic exam.     Annette Mackie DNP, 12:27 PM 12/13/2022

## 2022-12-14 ENCOUNTER — Encounter: Payer: Self-pay | Admitting: Obstetrics and Gynecology

## 2022-12-14 DIAGNOSIS — M25311 Other instability, right shoulder: Secondary | ICD-10-CM | POA: Diagnosis not present

## 2022-12-16 ENCOUNTER — Encounter: Payer: Self-pay | Admitting: Family Medicine

## 2022-12-19 DIAGNOSIS — Z6821 Body mass index (BMI) 21.0-21.9, adult: Secondary | ICD-10-CM | POA: Diagnosis not present

## 2022-12-19 DIAGNOSIS — M25511 Pain in right shoulder: Secondary | ICD-10-CM | POA: Diagnosis not present

## 2022-12-19 DIAGNOSIS — Z7952 Long term (current) use of systemic steroids: Secondary | ICD-10-CM | POA: Diagnosis not present

## 2022-12-19 DIAGNOSIS — M353 Polymyalgia rheumatica: Secondary | ICD-10-CM | POA: Diagnosis not present

## 2022-12-19 DIAGNOSIS — M1991 Primary osteoarthritis, unspecified site: Secondary | ICD-10-CM | POA: Diagnosis not present

## 2022-12-23 ENCOUNTER — Encounter: Payer: Self-pay | Admitting: Family Medicine

## 2022-12-30 ENCOUNTER — Encounter: Payer: Self-pay | Admitting: Family Medicine

## 2022-12-30 DIAGNOSIS — M25511 Pain in right shoulder: Secondary | ICD-10-CM | POA: Diagnosis not present

## 2023-01-17 ENCOUNTER — Other Ambulatory Visit: Payer: Self-pay | Admitting: Family Medicine

## 2023-01-18 NOTE — Telephone Encounter (Signed)
Requested medication (s) are due for refill today:   Provider to review  Requested medication (s) are on the active medication list:   Yes  Future visit scheduled:   No.   LOV 11/08/2022   Last ordered: 10/11/2022 #30, 0 refills     Non delegated refill     Requested Prescriptions  Pending Prescriptions Disp Refills   diazepam (VALIUM) 5 MG tablet [Pharmacy Med Name: DIAZEPAM 5 MG TABLET] 30 tablet 0    Sig: TAKE 1 TABLET (5 MG TOTAL) BY MOUTH AT BEDTIME AS NEEDED FOR ANXIETY     Not Delegated - Psychiatry: Anxiolytics/Hypnotics 2 Failed - 01/17/2023  4:10 PM      Failed - This refill cannot be delegated      Failed - Urine Drug Screen completed in last 360 days      Failed - Valid encounter within last 6 months    Recent Outpatient Visits           1 year ago Rhinosinusitis   [facility] Donita Brooks, MD   1 year ago Neuropathy   [facility] Donita Brooks, MD   2 years ago Concussion without loss of consciousness, initial encounter   [facility] Pickard, Priscille Heidelberg, MD   2 years ago Weight loss   [facility] Tanya Nones, Priscille Heidelberg, MD   3 years ago Pain of left clavicle   [facility] Pickard, Priscille Heidelberg, MD              Passed - Patient is not pregnant

## 2023-01-19 NOTE — Telephone Encounter (Signed)
Patient called to follow up on refill requested for diazepam (VALIUM) 5 MG tablet   Last pill taken 8/25; stated she's back to grinding her teeth at night. Requesting refill today.  Pharmacy confirmed as:  [id number] [place], [place] - [address] AT [facility] [address], [place] [address] Phone: [phone]  Fax: [phone] DEA #: [id number]   LOV 11/08/22.  Please advise at [phone].

## 2023-01-20 ENCOUNTER — Telehealth: Payer: Self-pay

## 2023-01-20 ENCOUNTER — Other Ambulatory Visit: Payer: Self-pay | Admitting: Family Medicine

## 2023-01-20 MED ORDER — DIAZEPAM 5 MG PO TABS: 5.0000 mg | ORAL_TABLET | Freq: Every evening | ORAL | 0 refills | Status: DC | PRN

## 2023-01-20 NOTE — Telephone Encounter (Signed)
Pt called stating she is out of her Diazepam. Last RF 10/11/22. Last OV 11/08/2022.

## 2023-01-20 NOTE — Telephone Encounter (Signed)
Pharmacy sent fax requesting new script for diazepam (VALIUM) 5 MG tablet   Pharmacy:   [id number] [place], [place] - [address] AT [facility] [address] [place] [address] Phone: [phone]  Fax: [phone] DEA #: [id number]   Please advise pharmacist.

## 2023-01-26 ENCOUNTER — Encounter: Payer: Self-pay | Admitting: Family Medicine

## 2023-02-06 ENCOUNTER — Other Ambulatory Visit: Payer: Self-pay | Admitting: Family Medicine

## 2023-02-06 DIAGNOSIS — H0014 Chalazion left upper eyelid: Secondary | ICD-10-CM | POA: Diagnosis not present

## 2023-02-06 DIAGNOSIS — H00015 Hordeolum externum left lower eyelid: Secondary | ICD-10-CM | POA: Diagnosis not present

## 2023-02-06 DIAGNOSIS — E079 Disorder of thyroid, unspecified: Secondary | ICD-10-CM

## 2023-02-07 NOTE — Telephone Encounter (Signed)
Due to a system glitch the last office visit is not detected for this practice.    LOV 11/08/2022.   Lab in date.  Requested Prescriptions  Pending Prescriptions Disp Refills   levothyroxine (SYNTHROID) 75 MCG tablet [Pharmacy Med Name: LEVOTHYROXINE 75 MCG TABLET] 90 tablet 0    Sig: TAKE 1 TABLET BY MOUTH DAILY BEFORE BREAKFAST.     Endocrinology:  Hypothyroid Agents Failed - 02/06/2023 10:33 AM      Failed - Valid encounter within last 12 months    Recent Outpatient Visits           1 year ago Rhinosinusitis   [facility] Pickard, Priscille Heidelberg, MD   1 year ago Neuropathy   [facility] Donita Brooks, MD   2 years ago Concussion without loss of consciousness, initial encounter   [facility] Tanya Nones, Priscille Heidelberg, MD   2 years ago Weight loss   [facility] Tanya Nones, Priscille Heidelberg, MD   3 years ago Pain of left clavicle   [facility] Pickard, Priscille Heidelberg, MD              Passed - TSH in normal range and within 360 days    TSH  Date Value Ref Range Status  07/20/2022 1.26 0.40 - 4.50 mIU/L Final

## 2023-02-15 ENCOUNTER — Telehealth: Payer: Self-pay

## 2023-02-15 NOTE — Telephone Encounter (Signed)
Pt LVM in triage line stating that her pharmacy advised her that they havent received a response from rx requests and for her to contact us about med refill.

## 2023-02-16 NOTE — Telephone Encounter (Signed)
Spoke w/ pt and informed her that TW sent in refills on her meds (estradiol gel and provera) to her pharmacy when she was here for her visit on 12/13/2022. So, she should not need refills at this time.  Pt reports that pharmacy is telling her otherwise. However, there was a point in time where she had picked up a refill of her meds at a different location but still same pharmacy, not sure if that created an issue.   Advised pt that I will contact pharmacy for her and find out information and let her know outcome. Pt voiced understanding and appreciation.   [facility] [place] per pt's preference for pickup and they reported that yes, the system can be a bit tricky if pt's switch up pick up locations but pt can pick up both rxs at their location today.   Pt notified and voiced understanding. Routing to provider for final review and closing encounter.

## 2023-02-22 DIAGNOSIS — H00015 Hordeolum externum left lower eyelid: Secondary | ICD-10-CM | POA: Diagnosis not present

## 2023-02-22 DIAGNOSIS — H0014 Chalazion left upper eyelid: Secondary | ICD-10-CM | POA: Diagnosis not present

## 2023-02-23 ENCOUNTER — Encounter: Payer: Self-pay | Admitting: Family Medicine

## 2023-02-24 ENCOUNTER — Other Ambulatory Visit: Payer: Self-pay | Admitting: Family Medicine

## 2023-02-24 MED ORDER — CEPHALEXIN 500 MG PO CAPS: 500.0000 mg | ORAL_CAPSULE | Freq: Three times a day (TID) | ORAL | 0 refills | Status: DC

## 2023-03-07 ENCOUNTER — Encounter: Payer: Self-pay | Admitting: Family Medicine

## 2023-03-07 ENCOUNTER — Ambulatory Visit (INDEPENDENT_AMBULATORY_CARE_PROVIDER_SITE_OTHER): Payer: Medicare Other | Admitting: Family Medicine

## 2023-03-07 VITALS — BP 112/62 | HR 67 | Temp 97.8°F | Ht 64.0 in | Wt 126.0 lb

## 2023-03-07 DIAGNOSIS — A310 Pulmonary mycobacterial infection: Secondary | ICD-10-CM | POA: Diagnosis not present

## 2023-03-07 DIAGNOSIS — J479 Bronchiectasis, uncomplicated: Secondary | ICD-10-CM | POA: Diagnosis not present

## 2023-03-07 DIAGNOSIS — R053 Chronic cough: Secondary | ICD-10-CM | POA: Diagnosis not present

## 2023-03-07 DIAGNOSIS — E079 Disorder of thyroid, unspecified: Secondary | ICD-10-CM | POA: Diagnosis not present

## 2023-03-07 MED ORDER — LEVOTHYROXINE SODIUM 75 MCG PO TABS: 75.0000 ug | ORAL_TABLET | Freq: Every day | ORAL | 1 refills | Status: DC

## 2023-03-07 NOTE — Progress Notes (Signed)
Subjective:    Patient ID: Annette Barker, female    DOB: 1945-08-15, 77 y.o.   MRN: [id number]  Patient has a history of MAI.  She saw pulmonology in 2023 and they recommended a repeat CT scan in 2 years to check for progression.  She also has a history of bronchiectasis.  I saw the patient last in June and at that time I was concerned that she may be developing left lower lobe pneumonia.  The patient states since June she has had a constant cough.  It will wax and wane but it never goes away.  Sometimes it is productive.  Most the time is dry and nonproductive.  She denies any fevers or chills or chest pain.  She denies any shortness of breath.  She denies any weight loss.  She denies any hemoptysis.  Past Medical History:  Diagnosis Date   Anxiety    Arthritis    in her hands    Elevated cholesterol    Hair loss    Hemorrhoids    Hyperlipidemia    Hypothyroidism    on synthroid   IBS (irritable bowel syndrome)    Lyme disease    Osteoporosis 03/2017   T score -3.2   Thyroid disease    hypothyroid   Past Surgical History:  Procedure Laterality Date   LAPAROSCOPIC SALPINGO OOPHERECTOMY Bilateral 10/29/2014   Procedure: LAPAROSCOPIC SALPINGO OOPHORECTOMY;  Surgeon: Dara Lords, MD;  Location: [facility];  Service: Gynecology;  Laterality: Bilateral;  1:00pm OR time requested  1 1/2 hours OR time requested.   SHOULDER SURGERY     TUBAL LIGATION     Current Outpatient Medications on File Prior to Visit  Medication Sig Dispense Refill   B Complex Vitamins (VITAMIN B COMPLEX PO) Take 1 tablet by mouth every other day.     cephALEXin (KEFLEX) 500 MG capsule Take 1 capsule (500 mg total) by mouth 3 (three) times daily. 15 capsule 0   diazepam (VALIUM) 5 MG tablet Take 1 tablet (5 mg total) by mouth at bedtime as needed for anxiety. 30 tablet 0   diphenhydrAMINE HCl (BENADRYL PO) Take 1 tablet by mouth daily as needed.     Estradiol 0.25 MG/0.25GM GEL Place topically on the lower  abdomen daily 90 each 2   levothyroxine (SYNTHROID) 75 MCG tablet TAKE 1 TABLET BY MOUTH DAILY BEFORE BREAKFAST. 90 tablet 0   medroxyPROGESTERone (PROVERA) 2.5 MG tablet Take 1 tablet (2.5 mg total) by mouth daily. 90 tablet 3   Methylcellulose, Laxative, (CITRUCEL PO) Take by mouth daily in the afternoon.     predniSONE (DELTASONE) 1 MG tablet Take 1 mg by mouth daily with breakfast.     triamcinolone cream (KENALOG) 0.1 % Apply 1 application topically 2 (two) times daily. 30 g 0   No current facility-administered medications on file prior to visit.   Allergies  Allergen Reactions   Hydromet [Hydrocodone Bit-Homatrop Mbr] Itching and Swelling   Codeine Swelling   Ibuprofen     Other reaction(s): reflux   Statins Other (See Comments)    Body cramps all over body   Social History   Socioeconomic History   Marital status: Widowed    Spouse name: Not on file   Number of children: Not on file   Years of education: Not on file   Highest education level: Not on file  Occupational History   Not on file  Tobacco Use   Smoking status: Former  Current packs/day: 0.00    Types: Cigarettes    Quit date: 05/27/1991    Years since quitting: 31.8   Smokeless tobacco: Never  Vaping Use   Vaping status: Never Used  Substance and Sexual Activity   Alcohol use: Yes    Alcohol/week: 0.0 standard drinks of alcohol    Comment: rare   Drug use: No   Sexual activity: Not Currently    Birth control/protection: Post-menopausal    Comment: 1st intercourse 77 yo--Fewer than 5 partners  Other Topics Concern   Not on file  Social History Narrative   Not on file   Social Determinants of Health   Financial Resource Strain: Low Risk  (04/12/2022)   Overall Financial Resource Strain (CARDIA)    Difficulty of Paying Living Expenses: Not hard at all  Food Insecurity: No Food Insecurity (04/12/2022)   Hunger Vital Sign    Worried About Running Out of Food in the Last Year: Never true    Ran Out  of Food in the Last Year: Never true  Transportation Needs: No Transportation Needs (04/12/2022)   PRAPARE - Administrator, Civil Service (Medical): No    Lack of Transportation (Non-Medical): No  Physical Activity: Sufficiently Active (04/12/2022)   Exercise Vital Sign    Days of Exercise per Week: 7 days    Minutes of Exercise per Session: 30 min  Stress: No Stress Concern Present (04/12/2022)   [employer] of Occupational Health - Occupational Stress Questionnaire    Feeling of Stress : Not at all  Social Connections: Socially Integrated (04/12/2022)   Social Connection and Isolation Panel [NHANES]    Frequency of Communication with Friends and Family: More than three times a week    Frequency of Social Gatherings with Friends and Family: More than three times a week    Attends Religious Services: More than 4 times per year    Active Member of [employer] or Organizations: Yes    Attends Banker Meetings: 1 to 4 times per year    Marital Status: Married  Catering manager Violence: Not At Risk (04/12/2022)   Humiliation, Afraid, Rape, and Kick questionnaire    Fear of Current or Ex-Partner: No    Emotionally Abused: No    Physically Abused: No    Sexually Abused: No      Review of Systems  All other systems reviewed and are negative.      Objective:   Physical Exam Vitals reviewed.  Constitutional:      General: She is not in acute distress.    Appearance: Normal appearance. She is not ill-appearing, toxic-appearing or diaphoretic.  HENT:     Head: Normocephalic and atraumatic.  Neck:     Vascular: No carotid bruit.  Cardiovascular:     Rate and Rhythm: Normal rate and regular rhythm.     Pulses: Normal pulses.     Heart sounds: Normal heart sounds. No murmur heard.    No friction rub. No gallop.  Pulmonary:     Effort: Pulmonary effort is normal. No respiratory distress.     Breath sounds: No stridor. Rales present. No wheezing or  rhonchi.  Chest:     Chest wall: No tenderness.  Musculoskeletal:     Right lower leg: No edema.     Left lower leg: No edema.     Right foot: Normal range of motion. No deformity, bunion, Charcot foot or prominent metatarsal heads.     Left foot: Normal  range of motion. No deformity, bunion, Charcot foot or prominent metatarsal heads.  Feet:     Right foot:     Skin integrity: Skin integrity normal.     Left foot:     Skin integrity: Skin integrity normal.  Neurological:     General: No focal deficit present.     Mental Status: She is alert and oriented to person, place, and time. Mental status is at baseline.     Motor: No weakness.     Coordination: Coordination normal.    Patient continues to have Rales in the left lower lobe     Assessment & Plan:  Bronchiectasis without complication (HCC) - Plan: CBC with Differential/Platelet, COMPLETE METABOLIC PANEL WITH GFR, CT Chest W Contrast  Thyroid disease - Plan: levothyroxine (SYNTHROID) 75 MCG tablet  MAI (mycobacterium avium-intracellulare) (HCC) - Plan: CT Chest W Contrast  Chronic cough I believe her chronic cough is likely secondary to her bronchiectasis.  However given the abnormal breath sounds and the persistent cough, I would recommend repeating the CT scan of the chest.  If CAT scan continues to show worsening bronchiectasis, I would recommend trying the patient on triple therapy to see if this can help with the chronic cough but improving vital capacity.  If not, recommend follow-up with pulmonology to discuss bronchoscopy and BAL.

## 2023-03-08 LAB — CBC WITH DIFFERENTIAL/PLATELET
Absolute Lymphocytes: 2072 {cells}/uL (ref 850–3900)
Absolute Monocytes: 614 {cells}/uL (ref 200–950)
Basophils Absolute: 59 {cells}/uL (ref 0–200)
Basophils Relative: 0.8 %
Eosinophils Absolute: 259 {cells}/uL (ref 15–500)
Eosinophils Relative: 3.5 %
HCT: 35.4 % (ref 35.0–45.0)
Hemoglobin: 11.9 g/dL (ref 11.7–15.5)
MCH: 32.9 pg (ref 27.0–33.0)
MCHC: 33.6 g/dL (ref 32.0–36.0)
MCV: 97.8 fL (ref 80.0–100.0)
MPV: 9.8 fL (ref 7.5–12.5)
Monocytes Relative: 8.3 %
Neutro Abs: 4396 {cells}/uL (ref 1500–7800)
Neutrophils Relative %: 59.4 %
Platelets: 317 10*3/uL (ref 140–400)
RBC: 3.62 10*6/uL — ABNORMAL LOW (ref 3.80–5.10)
RDW: 12.5 % (ref 11.0–15.0)
Total Lymphocyte: 28 %
WBC: 7.4 10*3/uL (ref 3.8–10.8)

## 2023-03-08 LAB — COMPLETE METABOLIC PANEL WITH GFR
AG Ratio: 1.3 (calc) (ref 1.0–2.5)
ALT: 14 U/L (ref 6–29)
AST: 17 U/L (ref 10–35)
Albumin: 4.1 g/dL (ref 3.6–5.1)
Alkaline phosphatase (APISO): 73 U/L (ref 37–153)
BUN: 20 mg/dL (ref 7–25)
CO2: 28 mmol/L (ref 20–32)
Calcium: 9.8 mg/dL (ref 8.6–10.4)
Chloride: 101 mmol/L (ref 98–110)
Creat: 0.88 mg/dL (ref 0.60–1.00)
Globulin: 3.2 g/dL (ref 1.9–3.7)
Glucose, Bld: 101 mg/dL — ABNORMAL HIGH (ref 65–99)
Potassium: 5 mmol/L (ref 3.5–5.3)
Sodium: 137 mmol/L (ref 135–146)
Total Bilirubin: 0.2 mg/dL (ref 0.2–1.2)
Total Protein: 7.3 g/dL (ref 6.1–8.1)
eGFR: 68 mL/min/{1.73_m2} (ref >=60)

## 2023-03-30 ENCOUNTER — Encounter: Payer: Self-pay | Admitting: Nurse Practitioner

## 2023-04-04 ENCOUNTER — Ambulatory Visit: Payer: Medicare Other | Admitting: Obstetrics and Gynecology

## 2023-04-08 ENCOUNTER — Encounter: Payer: Self-pay | Admitting: Family Medicine

## 2023-04-18 ENCOUNTER — Encounter: Payer: Self-pay | Admitting: Family Medicine

## 2023-04-18 ENCOUNTER — Other Ambulatory Visit: Payer: Self-pay | Admitting: Family Medicine

## 2023-04-18 NOTE — Telephone Encounter (Signed)
Requested medication (s) are due for refill today - yes  Requested medication (s) are on the active medication list -yes  Future visit scheduled -no  Last refill: 01/20/23 #30  Notes to clinic: non delegated Rx- may be duplicate request  Requested Prescriptions  Pending Prescriptions Disp Refills   diazepam (VALIUM) 5 MG tablet [Pharmacy Med Name: DIAZEPAM 5 MG TABLET] 30 tablet 0    Sig: Take 1 tablet (5 mg total) by mouth at bedtime as needed for anxiety.     Not Delegated - Psychiatry: Anxiolytics/Hypnotics 2 Failed - 04/18/2023 10:30 AM      Failed - This refill cannot be delegated      Failed - Urine Drug Screen completed in last 360 days      Failed - Valid encounter within last 6 months    Recent Outpatient Visits           1 year ago Rhinosinusitis   [facility] Pickard, Priscille Heidelberg, MD   2 years ago Neuropathy   [facility] Donita Brooks, MD   2 years ago Concussion without loss of consciousness, initial encounter   [facility] Pickard, Priscille Heidelberg, MD   3 years ago Weight loss   [facility] Tanya Nones, Priscille Heidelberg, MD   3 years ago Pain of left clavicle   [facility] Pickard, Priscille Heidelberg, MD              Passed - Patient is not pregnant         Requested Prescriptions  Pending Prescriptions Disp Refills   diazepam (VALIUM) 5 MG tablet [Pharmacy Med Name: DIAZEPAM 5 MG TABLET] 30 tablet 0    Sig: Take 1 tablet (5 mg total) by mouth at bedtime as needed for anxiety.     Not Delegated - Psychiatry: Anxiolytics/Hypnotics 2 Failed - 04/18/2023 10:30 AM      Failed - This refill cannot be delegated      Failed - Urine Drug Screen completed in last 360 days      Failed - Valid encounter within last 6 months    Recent Outpatient Visits           1 year ago Rhinosinusitis   [facility] Tanya Nones, Priscille Heidelberg, MD   2 years ago Neuropathy   [facility]  Donita Brooks, MD   2 years ago Concussion without loss of consciousness, initial encounter   [facility] Tanya Nones Priscille Heidelberg, MD   3 years ago Weight loss   [facility] Tanya Nones Priscille Heidelberg, MD   3 years ago Pain of left clavicle   [facility] Pickard, Priscille Heidelberg, MD              Passed - Patient is not pregnant

## 2023-04-19 MED ORDER — DIAZEPAM 5 MG PO TABS: 5.0000 mg | ORAL_TABLET | Freq: Every evening | ORAL | 0 refills | Status: DC | PRN

## 2023-06-05 ENCOUNTER — Encounter: Payer: Self-pay | Admitting: Family Medicine

## 2023-06-20 DIAGNOSIS — M1991 Primary osteoarthritis, unspecified site: Secondary | ICD-10-CM | POA: Diagnosis not present

## 2023-06-20 DIAGNOSIS — M353 Polymyalgia rheumatica: Secondary | ICD-10-CM | POA: Diagnosis not present

## 2023-07-04 ENCOUNTER — Encounter: Payer: Self-pay | Admitting: Family Medicine

## 2023-07-04 ENCOUNTER — Other Ambulatory Visit: Payer: Self-pay | Admitting: Family Medicine

## 2023-07-04 MED ORDER — DIAZEPAM 5 MG PO TABS: 5.0000 mg | ORAL_TABLET | Freq: Every evening | ORAL | 0 refills | Status: DC | PRN

## 2023-07-25 DIAGNOSIS — H02534 Eyelid retraction left upper eyelid: Secondary | ICD-10-CM | POA: Diagnosis not present

## 2023-07-25 DIAGNOSIS — H25813 Combined forms of age-related cataract, bilateral: Secondary | ICD-10-CM | POA: Diagnosis not present

## 2023-07-25 DIAGNOSIS — H353132 Nonexudative age-related macular degeneration, bilateral, intermediate dry stage: Secondary | ICD-10-CM | POA: Diagnosis not present

## 2023-07-25 DIAGNOSIS — H33322 Round hole, left eye: Secondary | ICD-10-CM | POA: Diagnosis not present

## 2023-08-04 DIAGNOSIS — M353 Polymyalgia rheumatica: Secondary | ICD-10-CM | POA: Diagnosis not present

## 2023-08-21 ENCOUNTER — Telehealth: Payer: Self-pay | Admitting: Family Medicine

## 2023-08-21 ENCOUNTER — Other Ambulatory Visit: Payer: Self-pay

## 2023-08-21 DIAGNOSIS — L821 Other seborrheic keratosis: Secondary | ICD-10-CM | POA: Diagnosis not present

## 2023-08-21 DIAGNOSIS — E079 Disorder of thyroid, unspecified: Secondary | ICD-10-CM

## 2023-08-21 DIAGNOSIS — L570 Actinic keratosis: Secondary | ICD-10-CM | POA: Diagnosis not present

## 2023-08-21 DIAGNOSIS — D225 Melanocytic nevi of trunk: Secondary | ICD-10-CM | POA: Diagnosis not present

## 2023-08-21 DIAGNOSIS — C44319 Basal cell carcinoma of skin of other parts of face: Secondary | ICD-10-CM | POA: Diagnosis not present

## 2023-08-21 DIAGNOSIS — L814 Other melanin hyperpigmentation: Secondary | ICD-10-CM | POA: Diagnosis not present

## 2023-08-21 MED ORDER — LEVOTHYROXINE SODIUM 75 MCG PO TABS: 75.0000 ug | ORAL_TABLET | Freq: Every day | ORAL | 1 refills | Status: DC

## 2023-08-21 NOTE — Telephone Encounter (Signed)
 Prescription Request  08/21/2023  LOV: 03/07/2023  What is the name of the medication or equipment?   levothyroxine (SYNTHROID) 75 MCG tablet   Have you contacted your pharmacy to request a refill? Yes   Which pharmacy would you like this sent to?  [id number] - [place], [place] - [address] AT [facility] [address]  [place] [address] Phone: [phone] Fax: [phone]    Patient notified that their request is being sent to the clinical staff for review and that they should receive a response within 2 business days.   Please advise pharmacist.

## 2023-08-29 ENCOUNTER — Encounter: Payer: Self-pay | Admitting: Family Medicine

## 2023-08-31 ENCOUNTER — Ambulatory Visit: Admitting: Family Medicine

## 2023-08-31 ENCOUNTER — Encounter: Payer: Self-pay | Admitting: Family Medicine

## 2023-08-31 VITALS — BP 114/68 | HR 59 | Temp 98.5°F | Ht 64.0 in | Wt 132.5 lb

## 2023-08-31 DIAGNOSIS — E079 Disorder of thyroid, unspecified: Secondary | ICD-10-CM | POA: Diagnosis not present

## 2023-08-31 DIAGNOSIS — M2550 Pain in unspecified joint: Secondary | ICD-10-CM | POA: Diagnosis not present

## 2023-08-31 NOTE — Progress Notes (Signed)
 Subjective:    Patient ID: Annette Barker, female    DOB: 12-30-1945, 78 y.o.   MRN: [id number]  Patient said that she was to be tested for Lyme disease.  She has stabilization thousand 20.  She states that recently she started hurting all over her entire body.  She states that her hands hurt, her knees hurt, her joints hurt, she states that even her skin hurts.  She recently saw her rheumatologist who checked a sed rate which was normal.  They increased prednisone from 1 mg a day to 5 mg a day and the patient states she feels worse.  She states that her muscles all over her body are sore.  She states that she recently had TWO TICK BITES.  She denies any rash.  She denies any fevers or chills.  She denies any weight loss.  However her hair has been falling out recently.  She reports feeling tired Past Medical History:  Diagnosis Date   Anxiety    Arthritis    in her hands    Elevated cholesterol    Hair loss    Hemorrhoids    Hyperlipidemia    Hypothyroidism    on synthroid   IBS (irritable bowel syndrome)    Lyme disease    Osteoporosis 03/2017   T score -3.2   Thyroid disease    hypothyroid   Past Surgical History:  Procedure Laterality Date   LAPAROSCOPIC SALPINGO OOPHERECTOMY Bilateral 10/29/2014   Procedure: LAPAROSCOPIC SALPINGO OOPHORECTOMY;  Surgeon: Dara Lords, MD;  Location: [facility];  Service: Gynecology;  Laterality: Bilateral;  1:00pm OR time requested  1 1/2 hours OR time requested.   SHOULDER SURGERY     TUBAL LIGATION     Current Outpatient Medications on File Prior to Visit  Medication Sig Dispense Refill   ascorbic acid (VITAMIN C) 500 MG tablet Take 500 mg by mouth daily.     diazepam (VALIUM) 5 MG tablet Take 1 tablet (5 mg total) by mouth at bedtime as needed for anxiety. 30 tablet 0   diphenhydrAMINE HCl (BENADRYL PO) Take 1 tablet by mouth daily as needed.     Estradiol 0.25 MG/0.25GM GEL Place topically on the lower abdomen daily 90 each 2    levothyroxine (SYNTHROID) 75 MCG tablet Take 1 tablet (75 mcg total) by mouth daily before breakfast. 90 tablet 1   medroxyPROGESTERone (PROVERA) 2.5 MG tablet Take 1 tablet (2.5 mg total) by mouth daily. 90 tablet 3   Methylcellulose, Laxative, (CITRUCEL PO) Take by mouth daily in the afternoon.     Multiple Minerals-Vitamins (CALCIUM & VIT D3 BONE HEALTH PO) Take by mouth.     predniSONE (DELTASONE) 1 MG tablet Take 1 mg by mouth daily with breakfast. (Patient taking differently: Take 1 mg by mouth daily with breakfast. 5mg  daily)     triamcinolone cream (KENALOG) 0.1 % Apply 1 application topically 2 (two) times daily. 30 g 0   cephALEXin (KEFLEX) 500 MG capsule Take 1 capsule (500 mg total) by mouth 3 (three) times daily. (Patient not taking: Reported on 08/31/2023) 15 capsule 0   No current facility-administered medications on file prior to visit.   Allergies  Allergen Reactions   Hydromet [Hydrocodone Bit-Homatrop Mbr] Itching and Swelling   Codeine Swelling   Ibuprofen     Other reaction(s): reflux   Statins Other (See Comments)    Body cramps all over body   Social History   Socioeconomic History   Marital  status: Widowed    Spouse name: Not on file   Number of children: Not on file   Years of education: Not on file   Highest education level: Not on file  Occupational History   Not on file  Tobacco Use   Smoking status: Former    Current packs/day: 0.00    Types: Cigarettes    Quit date: 05/27/1991    Years since quitting: 32.2   Smokeless tobacco: Never  Vaping Use   Vaping status: Never Used  Substance and Sexual Activity   Alcohol use: Yes    Alcohol/week: 0.0 standard drinks of alcohol    Comment: rare   Drug use: No   Sexual activity: Not Currently    Birth control/protection: Post-menopausal    Comment: 1st intercourse 78 yo--Fewer than 5 partners  Other Topics Concern   Not on file  Social History Narrative   Not on file   Social Drivers of Health    Financial Resource Strain: Low Risk  (04/12/2022)   Overall Financial Resource Strain (CARDIA)    Difficulty of Paying Living Expenses: Not hard at all  Food Insecurity: No Food Insecurity (04/12/2022)   Hunger Vital Sign    Worried About Running Out of Food in the Last Year: Never true    Ran Out of Food in the Last Year: Never true  Transportation Needs: No Transportation Needs (04/12/2022)   PRAPARE - Administrator, Civil Service (Medical): No    Lack of Transportation (Non-Medical): No  Physical Activity: Sufficiently Active (04/12/2022)   Exercise Vital Sign    Days of Exercise per Week: 7 days    Minutes of Exercise per Session: 30 min  Stress: No Stress Concern Present (04/12/2022)   [employer] of Occupational Health - Occupational Stress Questionnaire    Feeling of Stress : Not at all  Social Connections: Socially Integrated (04/12/2022)   Social Connection and Isolation Panel [NHANES]    Frequency of Communication with Friends and Family: More than three times a week    Frequency of Social Gatherings with Friends and Family: More than three times a week    Attends Religious Services: More than 4 times per year    Active Member of [employer] or Organizations: Yes    Attends Banker Meetings: 1 to 4 times per year    Marital Status: Married  Catering manager Violence: Not At Risk (04/12/2022)   Humiliation, Afraid, Rape, and Kick questionnaire    Fear of Current or Ex-Partner: No    Emotionally Abused: No    Physically Abused: No    Sexually Abused: No      Review of Systems  All other systems reviewed and are negative.      Objective:   Physical Exam Vitals reviewed.  Constitutional:      General: She is not in acute distress.    Appearance: Normal appearance. She is not ill-appearing, toxic-appearing or diaphoretic.  HENT:     Head: Normocephalic and atraumatic.  Cardiovascular:     Rate and Rhythm: Normal rate and regular  rhythm.     Pulses: Normal pulses.     Heart sounds: Normal heart sounds. No murmur heard.    No friction rub. No gallop.  Pulmonary:     Effort: Pulmonary effort is normal. No respiratory distress.     Breath sounds: No stridor. No wheezing, rhonchi or rales.  Chest:     Chest wall: No tenderness.  Musculoskeletal:  Right lower leg: No edema.     Left lower leg: No edema.     Right foot: Normal range of motion. No deformity, bunion, Charcot foot or prominent metatarsal heads.     Left foot: Normal range of motion. No deformity, bunion, Charcot foot or prominent metatarsal heads.  Feet:     Right foot:     Skin integrity: Skin integrity normal.     Left foot:     Skin integrity: Skin integrity normal.  Neurological:     Mental Status: She is alert.     Assessment & Plan:  Polyarthralgia - Plan: B. burgdorfi antibodies by WB, [place] m[place] spotted fvr abs pnl(IgG+IgM), CBC with Differential/Platelet, COMPLETE METABOLIC PANEL WITHOUT GFR, ANA, Rheumatoid factor, TSH Start by checking Lyme titers along with [facility] spotted fever titers to eliminate tickborne illnesses as a cause of her current polyarthralgias.  Also check ANA and rheumatoid factor.  Check CBC and CMP along with TSH for hair loss and fatigue.

## 2023-09-01 ENCOUNTER — Encounter: Payer: Self-pay | Admitting: Family Medicine

## 2023-09-05 LAB — CBC WITH DIFFERENTIAL/PLATELET
Absolute Lymphocytes: 2215 {cells}/uL (ref 850–3900)
Absolute Monocytes: 639 {cells}/uL (ref 200–950)
Basophils Absolute: 57 {cells}/uL (ref 0–200)
Basophils Relative: 0.8 %
Eosinophils Absolute: 170 {cells}/uL (ref 15–500)
Eosinophils Relative: 2.4 %
HCT: 35.6 % (ref 35.0–45.0)
Hemoglobin: 11.7 g/dL (ref 11.7–15.5)
MCH: 32.6 pg (ref 27.0–33.0)
MCHC: 32.9 g/dL (ref 32.0–36.0)
MCV: 99.2 fL (ref 80.0–100.0)
MPV: 9.8 fL (ref 7.5–12.5)
Monocytes Relative: 9 %
Neutro Abs: 4019 {cells}/uL (ref 1500–7800)
Neutrophils Relative %: 56.6 %
Platelets: 297 10*3/uL (ref 140–400)
RBC: 3.59 10*6/uL — ABNORMAL LOW (ref 3.80–5.10)
RDW: 12.6 % (ref 11.0–15.0)
Total Lymphocyte: 31.2 %
WBC: 7.1 10*3/uL (ref 3.8–10.8)

## 2023-09-05 LAB — B. BURGDORFI ANTIBODIES BY WB

## 2023-09-05 LAB — COMPLETE METABOLIC PANEL WITHOUT GFR
AG Ratio: 1.4 (calc) (ref 1.0–2.5)
ALT: 9 U/L (ref 6–29)
AST: 13 U/L (ref 10–35)
Albumin: 4 g/dL (ref 3.6–5.1)
Alkaline phosphatase (APISO): 58 U/L (ref 37–153)
BUN: 20 mg/dL (ref 7–25)
CO2: 25 mmol/L (ref 20–32)
Calcium: 9 mg/dL (ref 8.6–10.4)
Chloride: 103 mmol/L (ref 98–110)
Creat: 0.78 mg/dL (ref 0.60–1.00)
Globulin: 2.9 g/dL (ref 1.9–3.7)
Glucose, Bld: 89 mg/dL (ref 65–99)
Potassium: 4.1 mmol/L (ref 3.5–5.3)
Sodium: 136 mmol/L (ref 135–146)
Total Bilirubin: 0.3 mg/dL (ref 0.2–1.2)
Total Protein: 6.9 g/dL (ref 6.1–8.1)

## 2023-09-05 LAB — ANTI-NUCLEAR AB-TITER (ANA TITER)
ANA TITER: 1:40 {titer} — ABNORMAL HIGH
ANA Titer 1: 1:40 {titer} — ABNORMAL HIGH

## 2023-09-05 LAB — TSH: TSH: 1.69 m[IU]/L (ref 0.40–4.50)

## 2023-09-05 LAB — ANA: Anti Nuclear Antibody (ANA): POSITIVE — AB

## 2023-09-05 LAB — ROCKY MTN SPOTTED FVR ABS PNL(IGG+IGM)
RMSF IgG: NOT DETECTED
RMSF IgM: NOT DETECTED

## 2023-09-05 LAB — RHEUMATOID FACTOR: Rheumatoid fact SerPl-aCnc: <10 [IU]/mL (ref <14)

## 2023-09-06 ENCOUNTER — Other Ambulatory Visit: Payer: Self-pay

## 2023-09-06 ENCOUNTER — Telehealth: Payer: Self-pay

## 2023-09-06 DIAGNOSIS — E079 Disorder of thyroid, unspecified: Secondary | ICD-10-CM

## 2023-09-06 MED ORDER — LEVOTHYROXINE SODIUM 75 MCG PO TABS: 75.0000 ug | ORAL_TABLET | Freq: Every day | ORAL | 1 refills | Status: DC

## 2023-09-06 NOTE — Telephone Encounter (Signed)
 Copied from CRM [id number]. Topic: Clinical - Prescription Issue >> Sep 06, 2023  9:00 AM Crispin Dolphin wrote: Reason for CRM: Patient changed pharmacies and needs prescription levothyroxine (SYNTHROID) 75 MCG tablet and any other prescriptions that need to be sent now or in the future changes to [facility] on [address]. No longer uses [facility]. Patient currently out of levothyroxine. Thank You

## 2023-09-27 ENCOUNTER — Encounter (HOSPITAL_COMMUNITY): Payer: Self-pay

## 2023-10-02 ENCOUNTER — Encounter: Payer: Self-pay | Admitting: Family Medicine

## 2023-10-04 ENCOUNTER — Ambulatory Visit

## 2023-10-04 VITALS — Ht 64.0 in | Wt 132.0 lb

## 2023-10-04 DIAGNOSIS — Z0000 Encounter for general adult medical examination without abnormal findings: Secondary | ICD-10-CM | POA: Diagnosis not present

## 2023-10-04 NOTE — Progress Notes (Signed)
 Subjective:   Annette Barker is a 78 y.o. who presents for a Medicare Wellness preventive visit.  As a reminder, Annual Wellness Visits don't include a physical exam, and some assessments may be limited, especially if this visit is performed virtually. We may recommend an in-person visit if needed.  Visit Complete: Virtual I connected with  Ephraim Hash on 10/04/23 by a audio enabled telemedicine application and verified that I am speaking with the correct person using two identifiers.  Patient Location: Home  Provider Location: Home Office  I discussed the limitations of evaluation and management by telemedicine. The patient expressed understanding and agreed to proceed.  Vital Signs: Because this visit was a virtual/telehealth visit, some criteria may be missing or patient reported. Any vitals not documented were not able to be obtained and vitals that have been documented are patient reported.  VideoDeclined- This patient declined Librarian, academic. Therefore the visit was completed with audio only.  Persons Participating in Visit: Patient.  AWV Questionnaire: No: Patient Medicare AWV questionnaire was not completed prior to this visit.  Cardiac Risk Factors include: advanced age (>60men, >29 women);dyslipidemia     Objective:     Today's Vitals   10/04/23 0953  Weight: 132 lb (59.9 kg)  Height: 5\' 4"  (1.626 m)   Body mass index is 22.66 kg/m.     10/04/2023    9:56 AM 04/12/2022    9:39 AM 10/29/2020    9:19 AM 10/21/2014   12:40 PM  Advanced Directives  Does Patient Have a Medical Advance Directive? No Yes Yes Yes  Type of Special educational needs teacher of [place];Living will Healthcare Power of [place];Living will Healthcare Power of Attorney  Does patient want to make changes to medical advance directive?  No - Patient declined No - Patient declined   Copy of Healthcare Power of Attorney in Chart?  Yes - validated most  recent copy scanned in chart (See row information) Yes - validated most recent copy scanned in chart (See row information) Yes  Would patient like information on creating a medical advance directive? Yes (MAU/Ambulatory/Procedural Areas - Information given)       Current Medications (verified) Outpatient Encounter Medications as of 10/04/2023  Medication Sig   ascorbic acid (VITAMIN C) 500 MG tablet Take 500 mg by mouth daily.   cephALEXin  (KEFLEX ) 500 MG capsule Take 1 capsule (500 mg total) by mouth 3 (three) times daily.   diazepam  (VALIUM ) 5 MG tablet Take 1 tablet (5 mg total) by mouth at bedtime as needed for anxiety.   diphenhydrAMINE HCl (BENADRYL PO) Take 1 tablet by mouth daily as needed.   Estradiol  0.25 MG/0.25GM GEL Place topically on the lower abdomen daily   levothyroxine  (SYNTHROID ) 75 MCG tablet Take 1 tablet (75 mcg total) by mouth daily before breakfast.   medroxyPROGESTERone  (PROVERA ) 2.5 MG tablet Take 1 tablet (2.5 mg total) by mouth daily.   Methylcellulose, Laxative, (CITRUCEL PO) Take by mouth daily in the afternoon.   Multiple Minerals-Vitamins (CALCIUM  & VIT D3 BONE HEALTH PO) Take by mouth.   Multiple Vitamins-Minerals (PRESERVISION AREDS PO) Take by mouth.   predniSONE  (DELTASONE ) 1 MG tablet Take 1 mg by mouth daily with breakfast. (Patient taking differently: Take 1 mg by mouth daily with breakfast. 5mg  daily)   triamcinolone  cream (KENALOG ) 0.1 % Apply 1 application topically 2 (two) times daily.   No facility-administered encounter medications on file as of 10/04/2023.    Allergies (verified) Hydromet [  hydrocodone  bit-homatrop mbr], Codeine, Ibuprofen, and Statins   History: Past Medical History:  Diagnosis Date   Anxiety    Arthritis    in her hands    Elevated cholesterol    Hair loss    Hemorrhoids    Hyperlipidemia    Hypothyroidism    on synthroid    IBS (irritable bowel syndrome)    Lyme disease    Osteoporosis 03/2017   T score -3.2    Thyroid  disease    hypothyroid   Past Surgical History:  Procedure Laterality Date   LAPAROSCOPIC SALPINGO OOPHERECTOMY Bilateral 10/29/2014   Procedure: LAPAROSCOPIC SALPINGO OOPHORECTOMY;  Surgeon: Lacretia Piccolo, MD;  Location: [facility];  Service: Gynecology;  Laterality: Bilateral;  1:00pm OR time requested  1 1/2 hours OR time requested.   SHOULDER SURGERY     TUBAL LIGATION     Family History  Problem Relation Age of Onset   Alzheimer's disease Mother    Arthritis Mother    Osteoporosis Father    Cancer Father        leukemia   Arthritis Father    Hyperlipidemia Father    Cancer Sister        PERITONEAL CANCER   Arthritis Sister    Heart disease Sister    Arthritis Sister    Alcohol abuse Sister    Colitis Son    Anorexia nervosa Grandchild    Colon cancer Neg Hx    Stomach cancer Neg Hx    Esophageal cancer Neg Hx    Pancreatic cancer Neg Hx    Social History   Socioeconomic History   Marital status: Widowed    Spouse name: Not on file   Number of children: Not on file   Years of education: Not on file   Highest education level: Not on file  Occupational History   Not on file  Tobacco Use   Smoking status: Former    Current packs/day: 0.00    Types: Cigarettes    Quit date: 05/27/1991    Years since quitting: 32.3   Smokeless tobacco: Never  Vaping Use   Vaping status: Never Used  Substance and Sexual Activity   Alcohol use: Yes    Alcohol/week: 0.0 standard drinks of alcohol    Comment: rare   Drug use: No   Sexual activity: Not Currently    Birth control/protection: Post-menopausal    Comment: 1st intercourse 78 yo--Fewer than 5 partners  Other Topics Concern   Not on file  Social History Narrative   Not on file   Social Drivers of Health   Financial Resource Strain: Low Risk  (10/04/2023)   Overall Financial Resource Strain (CARDIA)    Difficulty of Paying Living Expenses: Not hard at all  Food Insecurity: No Food Insecurity (10/04/2023)    Hunger Vital Sign    Worried About Running Out of Food in the Last Year: Never true    Ran Out of Food in the Last Year: Never true  Transportation Needs: No Transportation Needs (10/04/2023)   PRAPARE - Administrator, Civil Service (Medical): No    Lack of Transportation (Non-Medical): No  Physical Activity: Sufficiently Active (10/04/2023)   Exercise Vital Sign    Days of Exercise per Week: 5 days    Minutes of Exercise per Session: 30 min  Stress: No Stress Concern Present (10/04/2023)   [employer] of Occupational Health - Occupational Stress Questionnaire    Feeling of Stress : Not at  all  Social Connections: Socially Integrated (10/04/2023)   Social Connection and Isolation Panel [NHANES]    Frequency of Communication with Friends and Family: More than three times a week    Frequency of Social Gatherings with Friends and Family: More than three times a week    Attends Religious Services: More than 4 times per year    Active Member of [employer] or Organizations: Yes    Attends Banker Meetings: 1 to 4 times per year    Marital Status: Married    Tobacco Counseling Counseling given: Not Answered    Clinical Intake:  Pre-visit preparation completed: Yes  Pain : No/denies pain     Diabetes: No  Lab Results  Component Value Date   HGBA1C 5.4 01/04/2022     How often do you need to have someone help you when you read instructions, pamphlets, or other written materials from your doctor or pharmacy?: 1 - Never  Interpreter Needed?: No  Information entered by :: Seabron Cypress LPN   Activities of Daily Living     10/04/2023    9:56 AM  In your present state of health, do you have any difficulty performing the following activities:  Hearing? 0  Vision? 0  Difficulty concentrating or making decisions? 0  Walking or climbing stairs? 0  Dressing or bathing? 0  Doing errands, shopping? 0  Preparing Food and eating ? N  Using the Toilet? N   In the past six months, have you accidently leaked urine? N  Do you have problems with loss of bowel control? N  Managing your Medications? N  Managing your Finances? N  Housekeeping or managing your Housekeeping? N    Patient Care Team: Austine Lefort, MD as PCP - General (Family Medicine) Andee Bamberger, NP as Nurse Practitioner (Gynecology) Rheumatology, [facility] (Rheumatology) Drusilla Gerlach, MD as Consulting Physician (Dermatology)  Indicate any recent Medical Services you may have received from other than [facility] providers in the past year (date may be approximate).     Assessment:    This is a routine wellness examination for Annette Barker.  Hearing/Vision screen Hearing Screening - Comments:: Denies hearing difficulties   Vision Screening - Comments:: up to date with routine eye exams    Goals Addressed             This Visit's Progress    Prevent falls   On track      Depression Screen     10/04/2023    9:55 AM 11/08/2022   10:53 AM 04/12/2022    9:33 AM 10/29/2020    9:25 AM 01/15/2019    9:25 AM 10/26/2017   11:22 AM 10/13/2015   11:45 AM  PHQ 2/9 Scores  PHQ - 2 Score 0 0 0 0 0 0 0    Fall Risk     10/04/2023    9:56 AM 11/08/2022   10:53 AM 04/12/2022    9:40 AM 10/29/2020    9:22 AM 04/17/2019    9:49 AM  Fall Risk   Falls in the past year? 0 0 1 1 1   Comment     Emmi Telephone Survey: data to providers prior to load  Number falls in past yr: 0 0 1 0 1  Comment     Emmi Telephone Survey Actual Response = 1  Injury with Fall? 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   Risk for fall due to :  No Fall Risks No Fall Risks History of fall(s);Impaired balance/gait No Fall Risks   Follow up Falls prevention discussed;Education provided;Falls evaluation completed Falls prevention discussed Falls prevention discussed Falls evaluation completed;Falls prevention discussed     MEDICARE RISK AT HOME:  Medicare Risk at Home Any stairs in or around  the home?: No If so, are there any without handrails?: No Home free of loose throw rugs in walkways, pet beds, electrical cords, etc?: Yes Adequate lighting in your home to reduce risk of falls?: Yes Life alert?: No Use of a cane, walker or w/c?: No Grab bars in the bathroom?: Yes Shower chair or bench in shower?: No Elevated toilet seat or a handicapped toilet?: Yes  TIMED UP AND GO:  Was the test performed?  No  Cognitive Function: 6CIT completed        10/04/2023    9:56 AM 04/12/2022    9:43 AM  6CIT Screen  What Year? 0 points 0 points  What month? 0 points 0 points  What time? 0 points 0 points  Count back from 20 0 points 0 points  Months in reverse 0 points 0 points  Repeat phrase 0 points 0 points  Total Score 0 points 0 points    Immunizations Immunization History  Administered Date(s) Administered   Fluad Quad(high Dose 65+) 01/15/2019, 03/19/2020, 04/19/2022   Influenza,inj,Quad PF,6+ Mos 02/10/2016, 03/17/2017, 04/18/2018   PFIZER(Purple Top)SARS-COV-2 Vaccination 06/30/2019, 07/24/2019   Pneumococcal Conjugate-13 01/15/2019   Tdap 05/23/2008, 04/19/2022    Screening Tests Health Maintenance  Topic Date Due   Hepatitis C Screening  Never done   Zoster Vaccines- Shingrix (1 of 2) Never done   Cervical Cancer Screening (Pap smear)  06/11/2015   Pneumonia Vaccine 88+ Years old (2 of 2 - PPSV23) 03/12/2019   COVID-19 Vaccine (3 - 2024-25 season) 01/22/2023   MAMMOGRAM  12/13/2023   INFLUENZA VACCINE  12/22/2023   Medicare Annual Wellness (AWV)  10/03/2024   Colonoscopy  02/21/2026   DTaP/Tdap/Td (3 - Td or Tdap) 04/19/2032   DEXA SCAN  Completed   HPV VACCINES  Aged Out   Meningococcal B Vaccine  Aged Out    Health Maintenance  Health Maintenance Due  Topic Date Due   Hepatitis C Screening  Never done   Zoster Vaccines- Shingrix (1 of 2) Never done   Cervical Cancer Screening (Pap smear)  06/11/2015   Pneumonia Vaccine 23+ Years old (2 of 2 -  PPSV23) 03/12/2019   COVID-19 Vaccine (3 - 2024-25 season) 01/22/2023   Health Maintenance Items Addressed: Information provided on dexa and shingrix   Additional Screening:  Vision Screening: Recommended annual ophthalmology exams for early detection of glaucoma and other disorders of the eye.  Dental Screening: Recommended annual dental exams for proper oral hygiene  Community Resource Referral / Chronic Care Management: CRR required this visit?  No   CCM required this visit?  No   Plan:    I have personally reviewed and noted the following in the patient's chart:   Medical and social history Use of alcohol, tobacco or illicit drugs  Current medications and supplements including opioid prescriptions. Patient is not currently taking opioid prescriptions. Functional ability and status Nutritional status Physical activity Advanced directives List of other physicians Hospitalizations, surgeries, and ER visits in previous 12 months Vitals Screenings to include cognitive, depression, and falls Referrals and appointments  In addition, I have reviewed and discussed with patient certain preventive protocols, quality metrics, and best practice recommendations. A  written personalized care plan for preventive services as well as general preventive health recommendations were provided to patient.   Seabron Cypress [place], [place]   1/61/0960   After Visit Summary: (MyChart) Due to this being a telephonic visit, the after visit summary with patients personalized plan was offered to patient via MyChart   Notes: Please refer to Routing Comments.

## 2023-10-04 NOTE — Patient Instructions (Signed)
 Ms. Golis , Thank you for taking time out of your busy schedule to complete your Annual Wellness Visit with me. I enjoyed our conversation and look forward to speaking with you again next year. I, as well as your care team,  appreciate your ongoing commitment to your health goals. Please review the following plan we discussed and let me know if I can assist you in the future. Your Game plan/ To Do List   Follow up Visits: Next Medicare AWV with our clinical staff: In 1 year   Have you seen your provider in the last 6 months (3 months if uncontrolled diabetes)? Yes Next Office Visit with your provider: To be scheduled   Clinician Recommendations:  Aim for 30 minutes of exercise or brisk walking, 6-8 glasses of water, and 5 servings of fruits and vegetables each day.       This is a list of the screening recommended for you and due dates:  Health Maintenance  Topic Date Due   Hepatitis C Screening  Never done   Zoster (Shingles) Vaccine (1 of 2) Never done   Pap Smear  06/11/2015   Pneumonia Vaccine (2 of 2 - PPSV23) 03/12/2019   COVID-19 Vaccine (3 - 2024-25 season) 01/22/2023   Mammogram  12/13/2023   Flu Shot  12/22/2023   Medicare Annual Wellness Visit  10/03/2024   Colon Cancer Screening  02/21/2026   DTaP/Tdap/Td vaccine (3 - Td or Tdap) 04/19/2032   DEXA scan (bone density measurement)  Completed   HPV Vaccine  Aged Out   Meningitis B Vaccine  Aged Out    Advanced directives: (In Chart) A copy of your advanced directives are scanned into your chart should your provider ever need it.  Advance Care Planning is important because it:  [x]  Makes sure you receive the medical care that is consistent with your values, goals, and preferences  [x]  It provides guidance to your family and loved ones and reduces their decisional burden about whether or not they are making the right decisions based on your wishes.  Follow the link provided in your after visit summary or read over the  paperwork we have mailed to you to help you started getting your Advance Directives in place. If you need assistance in completing these, please reach out to us  so that we can help you!  See attachments for Preventive Care and Fall Prevention Tips.

## 2023-10-05 DIAGNOSIS — K080 Exfoliation of teeth due to systemic causes: Secondary | ICD-10-CM | POA: Diagnosis not present

## 2023-10-08 ENCOUNTER — Other Ambulatory Visit: Payer: Self-pay | Admitting: Family Medicine

## 2023-10-09 ENCOUNTER — Telehealth: Payer: Self-pay

## 2023-10-09 NOTE — Telephone Encounter (Signed)
 Copied from CRM [id number]. Topic: Clinical - Prescription Issue >> Oct 09, 2023  1:14 PM Bridgette Campus T wrote: Reason for CRM: checking status of refill for diazepam  (VALIUM ) 5 MG tablet, patient is completely out

## 2023-10-10 NOTE — Telephone Encounter (Signed)
 Requested medication (s) are due for refill today yes  Requested medication (s) are on the active medication list -yes  Future visit scheduled -no  Last refill: 07/04/23 #30  Notes to clinic: non delegated Rx  Requested Prescriptions  Pending Prescriptions Disp Refills   diazepam  (VALIUM ) 5 MG tablet [Pharmacy Med Name: DIAZEPAM  5MG  TABLETS] 30 tablet     Sig: TAKE 1 TABLET(5 MG) BY MOUTH AT BEDTIME AS NEEDED FOR ANXIETY     Not Delegated - Psychiatry: Anxiolytics/Hypnotics 2 Failed - 10/10/2023  1:40 PM      Failed - This refill cannot be delegated      Failed - Urine Drug Screen completed in last 360 days      Failed - Valid encounter within last 6 months    Recent Outpatient Visits           1 month ago Polyarthralgia   [facility] Austine Lefort, MD   7 months ago Bronchiectasis without complication [facility])   [facility] Pickard, Cisco Crest, MD   11 months ago Bronchitis   [place] [facility] Pickard, Cisco Crest, MD   12 months ago Polyarthralgia   [place] [facility] Pickard, Cisco Crest, MD   1 year ago Elevated blood sugar   [place] [facility] Pickard, Cisco Crest, MD              Passed - Patient is not pregnant         Requested Prescriptions  Pending Prescriptions Disp Refills   diazepam  (VALIUM ) 5 MG tablet [Pharmacy Med Name: DIAZEPAM  5MG  TABLETS] 30 tablet     Sig: TAKE 1 TABLET(5 MG) BY MOUTH AT BEDTIME AS NEEDED FOR ANXIETY     Not Delegated - Psychiatry: Anxiolytics/Hypnotics 2 Failed - 10/10/2023  1:40 PM      Failed - This refill cannot be delegated      Failed - Urine Drug Screen completed in last 360 days      Failed - Valid encounter within last 6 months    Recent Outpatient Visits           1 month ago Polyarthralgia   [facility] Austine Lefort, MD   7 months ago Bronchiectasis without  complication [facility])   [place] [facility] Cheril Cork, Cisco Crest, MD   11 months ago Bronchitis   [facility] Cheril Cork, Cisco Crest, MD   12 months ago Polyarthralgia   [place] [facility] Pickard, Cisco Crest, MD   1 year ago Elevated blood sugar   [place] [facility] Pickard, Cisco Crest, MD              Passed - Patient is not pregnant

## 2023-10-11 ENCOUNTER — Other Ambulatory Visit: Payer: Self-pay | Admitting: Family Medicine

## 2023-10-11 NOTE — Telephone Encounter (Signed)
 Requested medication (s) are due for refill today - no  Requested medication (s) are on the active medication list -yes  Future visit scheduled -no  Last refill: 10/10/23  Notes to clinic: non delegated Rx- Request change in pharmacy Copied from CRM [id number]. Topic: Clinical - Prescription Issue >> Oct 11, 2023 11:55 AM Carlatta H wrote: Reason for CRM: Patients diazepam  (VALIUM ) 5 MG tablet [[id number]] needs to be transferred to [id number] Elfredia Grippe, [place] [address] AT [facility] [address] [facility] [address] [place]ra Cr[address] [place] [address] Phone: [phone] Fax: [phone] Hours: Not open 24 hours      Requested Prescriptions  Pending Prescriptions Disp Refills   diazepam  (VALIUM ) 5 MG tablet 30 tablet 1     Not Delegated - Psychiatry: Anxiolytics/Hypnotics 2 Failed - 10/11/2023  2:26 PM      Failed - This refill cannot be delegated      Failed - Urine Drug Screen completed in last 360 days      Failed - Valid encounter within last 6 months    Recent Outpatient Visits           1 month ago Polyarthralgia   [facility] Austine Lefort, MD   7 months ago Bronchiectasis without complication [facility])   [place] [facility] Pickard, Cisco Crest, MD   11 months ago Bronchitis   [place] [facility] Pickard, Cisco Crest, MD   12 months ago Polyarthralgia   [place] [facility] Cheril Cork, Cisco Crest, MD   1 year ago Elevated blood sugar   [place] [facility] Pickard, Cisco Crest, MD              Passed - Patient is not pregnant         Requested Prescriptions  Pending Prescriptions Disp Refills   diazepam  (VALIUM ) 5 MG tablet 30 tablet 1     Not Delegated - Psychiatry: Anxiolytics/Hypnotics 2 Failed - 10/11/2023  2:26 PM      Failed - This refill cannot be delegated      Failed - Urine Drug Screen completed in last 360 days       Failed - Valid encounter within last 6 months    Recent Outpatient Visits           1 month ago Polyarthralgia   [place] [facility] Austine Lefort, MD   7 months ago Bronchiectasis without complication [facility])   [place] Cheril Cork, Cisco Crest, MD   11 months ago Bronchitis   [place] [facility] Cheril Cork, Cisco Crest, MD   12 months ago Polyarthralgia   [place] [facility] Pickard, Cisco Crest, MD   1 year ago Elevated blood sugar   [place] [facility] Pickard, Cisco Crest, MD              Passed - Patient is not pregnant

## 2023-10-11 NOTE — Telephone Encounter (Signed)
 Copied from CRM [id number]. Topic: Clinical - Prescription Issue >> Oct 11, 2023 11:55 AM Carlatta H wrote: Reason for CRM: Patients diazepam  (VALIUM ) 5 MG tablet [[id number]] needs to be transferred to [id number] Elfredia Grippe, [place] - [address] AT [facility] [address] [facility] [address] [place] [place] [address] Phone: [phone] Fax: [phone] Hours: Not open 24 hours

## 2023-10-12 ENCOUNTER — Encounter: Payer: Self-pay | Admitting: Family Medicine

## 2023-10-12 ENCOUNTER — Telehealth: Payer: Self-pay

## 2023-10-12 ENCOUNTER — Other Ambulatory Visit: Payer: Self-pay | Admitting: Family Medicine

## 2023-10-12 MED ORDER — DIAZEPAM 5 MG PO TABS
5.0000 mg | ORAL_TABLET | Freq: Every evening | ORAL | 1 refills | Status: DC | PRN
Start: 2023-10-12 — End: 2024-03-25

## 2023-10-12 NOTE — Telephone Encounter (Signed)
 Copied from CRM [id number]. Topic: Clinical - Prescription Issue >> Oct 11, 2023 11:55 AM Carlatta H wrote: Reason for CRM: Patients diazepam  (VALIUM ) 5 MG tablet [[id number]] needs to be transferred to [id number] [place]rip[place], [place] - [address] AT [facility] OF [facility] [address] [place] [place] [address] Phone: [phone] Fax: [phone] Hours: Not open 24 hours >> Oct 12, 2023 12:43 PM Emylou G wrote: Pls call patient.. concerned that her jaw is hurting and needs her medication.. I did adv of turn around time.. Prefers the out of town location.Aaron Aas as requested attached

## 2023-10-24 DIAGNOSIS — K080 Exfoliation of teeth due to systemic causes: Secondary | ICD-10-CM | POA: Diagnosis not present

## 2023-11-06 DIAGNOSIS — K080 Exfoliation of teeth due to systemic causes: Secondary | ICD-10-CM | POA: Diagnosis not present

## 2023-11-07 DIAGNOSIS — C44319 Basal cell carcinoma of skin of other parts of face: Secondary | ICD-10-CM | POA: Diagnosis not present

## 2023-11-08 ENCOUNTER — Encounter: Payer: Self-pay | Admitting: Family Medicine

## 2023-11-13 ENCOUNTER — Ambulatory Visit: Payer: Self-pay

## 2023-11-13 NOTE — Telephone Encounter (Signed)
 FYI Only or Action Required?: FYI only for provider.  Patient was last seen in primary care on 08/31/2023 by Duanne Butler DASEN, MD. Called Nurse Triage reporting Head Injury. Symptoms began several days ago. Interventions attempted: Nothing. Symptoms are: unchanged.  Triage Disposition: Go to ED or PCP/Alternative with Approval  Patient/caregiver understands and will follow disposition?: Yes                             Copied from CRM [id number]. Topic: Clinical - Red Word Triage >> Nov 13, 2023  4:44 PM Annette Barker wrote: Red Word that prompted transfer to Nurse Triage: Patient states she maybe experiencing a concussion and is very concern. She is having severe headaches,feeling pressure behind nose and experiencing nausea. Patient states was hit in the face by a horse while feeding the horse last Thursday but symptoms are getting worse. Reason for Disposition  Dangerous injury (e.g., MVA, diving, trampoline, contact sports, fall > 10 feet or 3 meters) or severe blow from hard object (e.g., golf club or baseball bat)  Answer Assessment - Initial Assessment Questions 1. MECHANISM: How did the injury happen? For falls, ask: What height did you fall from? and What surface did you fall against?      Hit in the face by a horse, states horse hit her with a ton of force 2. ONSET: When did the injury happen? (Minutes or hours ago)      Thursday  3. NEUROLOGIC SYMPTOMS: Was there any loss of consciousness? Are there any other neurological symptoms?      Denies loss of consciousness 4. MENTAL STATUS: Does the person know who they are, who you are, and where they are?      AxOx3 5. LOCATION: What part of the head was hit?      Left eye, nose and forehead 7. SIZE: For cuts, bruises, or swelling, ask: How large is it? (e.g., inches or centimeters)      Denies bruising, denies bleeding 8. PAIN: Is there any pain? If Yes, ask: How bad is it?  (e.g., Scale 1-10;  or mild, moderate, severe)     Headache and nose pain, states she is experiencing a lot of pressure behind her eyes 10. OTHER SYMPTOMS: Do you have any other symptoms? (e.g., neck pain, vomiting)       Nausea and headache the day after getting hit by horse, pressure behind eyes and nose, denies vomiting, states she is able to move her neck normally, but it feels slightly stiff, denies vision changes  Red bump the size of a dime on her temple, thinks it's a infected hair follicle  Protocols used: Head Injury-A-AH

## 2023-11-14 ENCOUNTER — Emergency Department (HOSPITAL_BASED_OUTPATIENT_CLINIC_OR_DEPARTMENT_OTHER)
Admission: EM | Admit: 2023-11-14 | Discharge: 2023-11-14 | Disposition: A | Discharge status: Home / Self Care | Attending: Emergency Medicine | Admitting: Emergency Medicine

## 2023-11-14 ENCOUNTER — Other Ambulatory Visit: Payer: Self-pay

## 2023-11-14 ENCOUNTER — Other Ambulatory Visit (HOSPITAL_BASED_OUTPATIENT_CLINIC_OR_DEPARTMENT_OTHER): Payer: Self-pay

## 2023-11-14 ENCOUNTER — Encounter: Payer: Self-pay | Admitting: Family Medicine

## 2023-11-14 ENCOUNTER — Encounter (HOSPITAL_BASED_OUTPATIENT_CLINIC_OR_DEPARTMENT_OTHER): Payer: Self-pay | Admitting: Emergency Medicine

## 2023-11-14 ENCOUNTER — Emergency Department (HOSPITAL_BASED_OUTPATIENT_CLINIC_OR_DEPARTMENT_OTHER)

## 2023-11-14 DIAGNOSIS — S0006XA Insect bite (nonvenomous) of scalp, initial encounter: Secondary | ICD-10-CM | POA: Insufficient documentation

## 2023-11-14 DIAGNOSIS — S060X0A Concussion without loss of consciousness, initial encounter: Secondary | ICD-10-CM | POA: Diagnosis not present

## 2023-11-14 DIAGNOSIS — R519 Headache, unspecified: Secondary | ICD-10-CM | POA: Diagnosis not present

## 2023-11-14 DIAGNOSIS — M542 Cervicalgia: Secondary | ICD-10-CM | POA: Insufficient documentation

## 2023-11-14 DIAGNOSIS — R9082 White matter disease, unspecified: Secondary | ICD-10-CM | POA: Diagnosis not present

## 2023-11-14 DIAGNOSIS — W57XXXA Bitten or stung by nonvenomous insect and other nonvenomous arthropods, initial encounter: Secondary | ICD-10-CM

## 2023-11-14 DIAGNOSIS — S0990XA Unspecified injury of head, initial encounter: Secondary | ICD-10-CM | POA: Diagnosis not present

## 2023-11-14 DIAGNOSIS — W5512XA Struck by horse, initial encounter: Secondary | ICD-10-CM | POA: Diagnosis not present

## 2023-11-14 MED ORDER — DOXYCYCLINE HYCLATE 100 MG PO CAPS
100.0000 mg | ORAL_CAPSULE | Freq: Two times a day (BID) | ORAL | 0 refills | Status: DC
  Filled 2023-11-14: qty 14, 7d supply, fill #0

## 2023-11-14 NOTE — ED Provider Notes (Signed)
 Annette Barker Provider Note   CSN: [id number] Arrival date & time: 11/14/23  1043     Patient presents with: Headache   Annette Barker is a 78 y.o. female.   Patient is a 78 year old female with a history of thyroid  disease, hyperlipidemia who is presenting today with complaints of ongoing facial pain and feeling just not quite herself.  She reports this started about 1 week ago.  She was in the pasture feeding her horse some lettuce when the horse flipped its head and hurt hit her directly in the face.  She reports since that time she has had significant pressure and discomfort in her nose and forehead and her eyes have felt not quite right.  She has had some minimal nausea but denies any vomiting.  She has had no difficulty walking and denies any coordination issues.  No weakness in the extremities or aphasia.  She has some minimal tenderness in her neck.  She denies any fevers but reports she also noted a knot on the left side of her head since the day after this happened with the horse and also noted to having a tick bite last week.  She does not take anticoagulation  The history is provided by the patient.  Headache      Prior to Admission medications   Medication Sig Start Date End Date Taking? Authorizing Provider  doxycycline  (VIBRAMYCIN ) 100 MG capsule Take 1 capsule (100 mg total) by mouth 2 (two) times daily. 11/14/23  Yes Doretha Folks, MD  ascorbic acid (VITAMIN C) 500 MG tablet Take 500 mg by mouth daily.    [provider]  cephALEXin  (KEFLEX ) 500 MG capsule Take 1 capsule (500 mg total) by mouth 3 (three) times daily. 02/24/23   Duanne Butler DASEN, MD  diazepam  (VALIUM ) 5 MG tablet Take 1 tablet (5 mg total) by mouth at bedtime as needed for anxiety. 10/12/23   Duanne Butler DASEN, MD  diphenhydrAMINE HCl (BENADRYL PO) Take 1 tablet by mouth daily as needed.    [provider]  Estradiol  0.25 MG/0.25GM GEL Place  topically on the lower abdomen daily 12/13/22   Prentiss Annabella LABOR, NP  levothyroxine  (SYNTHROID ) 75 MCG tablet Take 1 tablet (75 mcg total) by mouth daily before breakfast. 09/06/23   Duanne Butler DASEN, MD  medroxyPROGESTERone  (PROVERA ) 2.5 MG tablet Take 1 tablet (2.5 mg total) by mouth daily. 12/13/22   Prentiss Annabella LABOR, NP  Methylcellulose, Laxative, (CITRUCEL PO) Take by mouth daily in the afternoon.    [provider]  Multiple Minerals-Vitamins (CALCIUM  & VIT D3 BONE HEALTH PO) Take by mouth.    [provider]  Multiple Vitamins-Minerals (PRESERVISION AREDS PO) Take by mouth.    [provider]  predniSONE  (DELTASONE ) 1 MG tablet Take 1 mg by mouth daily with breakfast. Patient taking differently: Take 1 mg by mouth daily with breakfast. 5mg  daily    [provider]  triamcinolone  cream (KENALOG ) 0.1 % Apply 1 application topically 2 (two) times daily. 01/16/20   Duanne Butler DASEN, MD    Allergies: Hydromet [hydrocodone  bit-homatrop mbr], Codeine, Ibuprofen, and Statins    Review of Systems  Neurological:  Positive for headaches.    Updated Vital Signs BP (!) 167/78 (BP Location: Left Arm)   Pulse 68   Temp 97.9 F (36.6 C) (Oral)   Resp 18   LMP 05/23/1998   SpO2 99%   Physical Exam Vitals and nursing note reviewed.  Constitutional:      General: She is not in acute distress.    Appearance: She is well-developed.  HENT:     Head: Normocephalic and atraumatic.      Right Ear: Tympanic membrane normal.     Left Ear: Tympanic membrane normal.   Eyes:     Extraocular Movements: Extraocular movements intact.     Conjunctiva/sclera: Conjunctivae normal.     Pupils: Pupils are equal, round, and reactive to light.    Cardiovascular:     Rate and Rhythm: Normal rate and regular rhythm.     Heart sounds: Normal heart sounds. No murmur heard.    No friction rub.  Pulmonary:     Effort: Pulmonary effort is normal.     Breath sounds:  Normal breath sounds. No wheezing or rales.  Abdominal:     General: Bowel sounds are normal. There is no distension.     Palpations: Abdomen is soft.     Tenderness: There is no abdominal tenderness. There is no guarding or rebound.   Musculoskeletal:        General: No tenderness. Normal range of motion.     Cervical back: Normal range of motion and neck supple. No tenderness.     Comments: No edema   Skin:    General: Skin is warm and dry.     Findings: No rash.   Neurological:     Mental Status: She is alert and oriented to person, place, and time.     Cranial Nerves: No cranial nerve deficit.     Sensory: No sensory deficit.     Motor: No weakness.     Gait: Gait normal.   Psychiatric:        Behavior: Behavior normal.     (all labs ordered are listed, but only abnormal results are displayed) Labs Reviewed - No data to display  EKG: None  Radiology: CT Head Wo Contrast Result Date: 11/14/2023 CLINICAL DATA:  Hit in head by a horse last Thursday, headache EXAM: CT HEAD WITHOUT CONTRAST CT MAXILLOFACIAL WITHOUT CONTRAST TECHNIQUE: Multidetector CT imaging of the head and maxillofacial structures were performed using the standard protocol without intravenous contrast. Multiplanar CT image reconstructions of the maxillofacial structures were also generated. RADIATION DOSE REDUCTION: This exam was performed according to the departmental dose-optimization program which includes automated exposure control, adjustment of the mA and/or kV according to patient size and/or use of iterative reconstruction technique. COMPARISON:  None Available. FINDINGS: CT HEAD FINDINGS Brain: No evidence of acute infarction, hemorrhage, hydrocephalus, extra-axial collection or mass lesion/mass effect. Periventricular white matter hypodensity. Vascular: No hyperdense vessel or unexpected calcification. CT FACIAL BONES FINDINGS Skull: Normal. Negative for fracture or focal lesion. Facial bones: No  displaced fractures or dislocations. Sinuses/Orbits: No acute finding. Other: None. IMPRESSION: 1. No acute intracranial pathology. Small-vessel white matter disease. 2. No displaced fractures or dislocations of the facial bones. Electronically Signed   By: Marolyn JONETTA Jaksch M.D.   On: 11/14/2023 12:09   CT Maxillofacial Wo Contrast Result Date: 11/14/2023 CLINICAL DATA:  Hit in head by a horse last Thursday, headache EXAM: CT HEAD WITHOUT CONTRAST CT MAXILLOFACIAL WITHOUT CONTRAST TECHNIQUE: Multidetector CT imaging of the head and maxillofacial structures were performed using the standard protocol without intravenous contrast. Multiplanar CT image reconstructions of the maxillofacial structures were also generated. RADIATION DOSE REDUCTION: This exam was performed according to the departmental dose-optimization program which includes automated exposure control, adjustment of the mA and/or kV according to patient  size and/or use of iterative reconstruction technique. COMPARISON:  None Available. FINDINGS: CT HEAD FINDINGS Brain: No evidence of acute infarction, hemorrhage, hydrocephalus, extra-axial collection or mass lesion/mass effect. Periventricular white matter hypodensity. Vascular: No hyperdense vessel or unexpected calcification. CT FACIAL BONES FINDINGS Skull: Normal. Negative for fracture or focal lesion. Facial bones: No displaced fractures or dislocations. Sinuses/Orbits: No acute finding. Other: None. IMPRESSION: 1. No acute intracranial pathology. Small-vessel white matter disease. 2. No displaced fractures or dislocations of the facial bones. Electronically Signed   By: Marolyn JONETTA Jaksch M.D.   On: 11/14/2023 12:09     Procedures   Medications Ordered in the ED - No data to display                                  Medical Decision Making Amount and/or Complexity of Data Reviewed Radiology: ordered and independent interpretation performed. Decision-making details documented in ED  Course.  Risk Prescription drug management.   Pt with multiple medical problems and comorbidities and presenting today with a complaint that caries a high risk for morbidity and mortality.  Here today for above symptoms.  Concern for concussion, intracranial bleed, fracture, sinusitis, cellulitis.  Low suspicion for stroke.  Also concern for possible developing [facility] spotted fever as she did have a tick bite as well and has a small area on her left scalp concerning for a pustule and mild cellulitis.  No evidence of shingles at this time.  She does not take anticoagulation.  Well-appearing on exam and neurovascularly intact.  I have independently visualized and interpreted pt's images today.  CT of the head negative for intracranial hemorrhage and CT of the face negative for fracture.  No evidence of hemosinus.  Radiology reports no displaced fractures or dislocations and small vessel white matter disease only without other intracranial pathology.  Findings discussed with the patient.  Will cover for tick related illness as well as the pustule and mild cellulitis on scalp exam.  She was given return precautions at this time appears clear for discharge.     Final diagnoses:  Concussion without loss of consciousness, initial encounter  Tick bite, unspecified site, initial encounter    ED Discharge Orders          Ordered    doxycycline  (VIBRAMYCIN ) 100 MG capsule  2 times daily        11/14/23 1228               Doretha Folks, MD 11/14/23 1230

## 2023-11-14 NOTE — ED Notes (Signed)
 Reviewed AVS/discharge instruction with patient. Time allotted for and all questions answered. Patient is agreeable for d/c and escorted to ed exit by staff.

## 2023-11-14 NOTE — ED Notes (Signed)
 Patient transported to CT

## 2023-11-14 NOTE — Discharge Instructions (Addendum)
 Everything with the scans are normal.  You will be covered with doxycycline  which will cover for that area on your scalp as well as for any tick bite type illness.  Follow-up with your doctor next week if your symptoms are not resolving.  Make sure you are drinking plenty of fluids and getting rest.  Avoid eyestrain, getting overheated and extreme exertion.

## 2023-11-14 NOTE — ED Triage Notes (Signed)
 States was hit in head by horse on last Thursday. Denies LOC. C/o increased headache and pressure. No thinners.   Small area on left temporal area.

## 2023-11-14 NOTE — ED Notes (Signed)
 Returned from CT.

## 2023-12-08 ENCOUNTER — Other Ambulatory Visit: Payer: Self-pay | Admitting: *Deleted

## 2023-12-08 DIAGNOSIS — Z79890 Hormone replacement therapy (postmenopausal): Secondary | ICD-10-CM

## 2023-12-08 NOTE — Telephone Encounter (Signed)
 Patient left message on triage line requesting estrogen refill.   Med refill request: Estradiol  0.25 mg/0.25GM gel Last AEX: 11/03/21 -JJ; OV 12/13/22-TW Next AEX: Scheduled for 02/13/24 at 1030 with TW Last MMG (if hormonal med) Screening followed by [facility] Dx MMG on 12/13/22. BiRads 1 neg; States she is scheduled next week for screening MMG.   Refill authorized: Please Advise?

## 2023-12-11 MED ORDER — ESTRADIOL 0.25 MG/0.25GM TD GEL: TRANSDERMAL | 0 refills | Status: DC

## 2023-12-19 DIAGNOSIS — Z7952 Long term (current) use of systemic steroids: Secondary | ICD-10-CM | POA: Diagnosis not present

## 2023-12-19 DIAGNOSIS — M1991 Primary osteoarthritis, unspecified site: Secondary | ICD-10-CM | POA: Diagnosis not present

## 2023-12-19 DIAGNOSIS — M353 Polymyalgia rheumatica: Secondary | ICD-10-CM | POA: Diagnosis not present

## 2023-12-21 ENCOUNTER — Encounter: Payer: Self-pay | Admitting: Family Medicine

## 2023-12-26 DIAGNOSIS — Z1231 Encounter for screening mammogram for malignant neoplasm of breast: Secondary | ICD-10-CM | POA: Diagnosis not present

## 2023-12-26 LAB — HM MAMMOGRAPHY

## 2024-02-13 ENCOUNTER — Encounter: Admitting: Nurse Practitioner

## 2024-02-22 ENCOUNTER — Encounter: Payer: Self-pay | Admitting: Nurse Practitioner

## 2024-02-22 ENCOUNTER — Ambulatory Visit (INDEPENDENT_AMBULATORY_CARE_PROVIDER_SITE_OTHER): Admitting: Nurse Practitioner

## 2024-02-22 VITALS — BP 118/76 | HR 67 | Ht 62.5 in | Wt 132.0 lb

## 2024-02-22 DIAGNOSIS — Z01419 Encounter for gynecological examination (general) (routine) without abnormal findings: Secondary | ICD-10-CM

## 2024-02-22 DIAGNOSIS — M810 Age-related osteoporosis without current pathological fracture: Secondary | ICD-10-CM

## 2024-02-22 DIAGNOSIS — Z79890 Hormone replacement therapy (postmenopausal): Secondary | ICD-10-CM | POA: Diagnosis not present

## 2024-02-22 DIAGNOSIS — Z1211 Encounter for screening for malignant neoplasm of colon: Secondary | ICD-10-CM

## 2024-02-22 DIAGNOSIS — Z780 Asymptomatic menopausal state: Secondary | ICD-10-CM

## 2024-02-22 MED ORDER — MEDROXYPROGESTERONE ACETATE 2.5 MG PO TABS: 2.5000 mg | ORAL_TABLET | Freq: Every day | ORAL | 3 refills | Status: AC

## 2024-02-22 MED ORDER — ESTRADIOL 0.25 MG/0.25GM TD GEL: TRANSDERMAL | 3 refills | Status: AC

## 2024-02-22 NOTE — Progress Notes (Signed)
 Annette Barker 1946/01/12 [id number]   History:  78 y.o. H6E7987 presents for breast and pelvic exam. On HRT - estradiol  gel 0.25% nightly, provera  2.5 mg daily. Osteoporosis. T-score -3.0 July 2023. Was on Fosamax  2019-2021, discontinued by PCP per patient. Dr. Jertson recommended to restart Fosamax  but she does not want to start at this time. Plans to optimize Vit D, calcium  and exercise. Long-term steroid use for PMR.   Gynecologic History Patient's last menstrual period was 05/23/1998.   Contraception: post menopausal status Sexually active: Yes  Health Maintenance Last Pap: 06/10/2014. Results were: Normal Last mammogram: 12/26/2023. Results were: Normal Last colonoscopy: 02/16/2016. Results were: Normal, 5-year recall Last Dexa: 12/14/2021. Results were: T-score -3.0 (-2.6 in 2021)  Past medical history, past surgical history, family history and social history were all reviewed and documented in the EPIC chart. Widowed. Boyfriend. Living with him, considering marriage. Son in [place], has 2 kids. Daughter in [place], has 1 kid.   ROS:  A ROS was performed and pertinent positives and negatives are included.  Exam:  Vitals:   02/22/24 1402  BP: 118/76  Pulse: 67  SpO2: 98%  Weight: 132 lb (59.9 kg)  Height: 5' 2.5 (1.588 m)    Body mass index is 23.76 kg/m.  General appearance:  Normal Thyroid :  Symmetrical, normal in size, without palpable masses or nodularity. Respiratory  Auscultation:  Clear without wheezing or rhonchi Cardiovascular  Auscultation:  Regular rate, without rubs, murmurs or gallops  Edema/varicosities:  Not grossly evident Abdominal  Soft,nontender, without masses, guarding or rebound.  Liver/spleen:  No organomegaly noted  Hernia:  None appreciated  Skin  Inspection:  Grossly normal Breasts: Examined lying and sitting.   Right: Without masses, retractions, nipple discharge or axillary adenopathy.   Left: Without masses, retractions, nipple  discharge or axillary adenopathy. Pelvic: External genitalia:  no lesions              Urethra:  normal appearing urethra with no masses, tenderness or lesions              Bartholins and Skenes: normal                 Vagina: normal appearing vagina with normal color and discharge, no lesions. Atrophic changes              Cervix: no lesions Bimanual Exam:  Uterus:  no masses or tenderness              Adnexa: no mass, fullness, tenderness              Rectovaginal: Deferred              Anus:  normal, no lesions  Kari Leaven, CMA present as chaperone.   Assessment/Plan:  78 y.o. [id number] for medication management.   Encounter for breast and pelvic examination - Education provided on SBEs, importance of preventative screenings, current guidelines, high calcium  diet, regular exercise, and multivitamin daily. Labs with PCP.   Postmenopausal - on HRT, no bleeding  Postmenopausal hormone therapy - Plan: Estradiol  0.25 MG/0.25GM GEL, medroxyPROGESTERone  (PROVERA ) 2.5 MG tablet.  Doing well on this and wants to continue. Aware of risks with continued use.   Age-related osteoporosis without current pathological fracture - Plan: DG Bone Density. T-score -3.0 in July 2023. Was on Fosamax  ~2019-2021, discontinued by PCP per patient. Dr. Jertson recommended restarting Fosamax  in 2023 but patient not interested is medication at this time. Educated on fracture risk and fall  prevention. Long-term prednisone  use.   Screening for colon cancer - Plan: Cologuard. 2017 Colonoscopy. 5-year recall. colonoscopies. Declines future colonoscopies.   Screening for cervical cancer - Normal Pap history.  No longer screening per guidelines.   Screening for breast cancer - Normal mammogram history.  Continue annual screenings.  Normal breast exam today.  Return in about 1 year (around 02/21/2025) for Med follow up.     Annabella DELENA Shutter DNP, 3:08 PM 02/22/2024

## 2024-03-11 DIAGNOSIS — Z1211 Encounter for screening for malignant neoplasm of colon: Secondary | ICD-10-CM | POA: Diagnosis not present

## 2024-03-16 LAB — COLOGUARD: COLOGUARD: NEGATIVE

## 2024-03-18 ENCOUNTER — Ambulatory Visit: Payer: Self-pay | Admitting: Nurse Practitioner

## 2024-03-23 ENCOUNTER — Encounter: Payer: Self-pay | Admitting: Family Medicine

## 2024-03-25 ENCOUNTER — Other Ambulatory Visit: Payer: Self-pay | Admitting: Family Medicine

## 2024-03-25 MED ORDER — DIAZEPAM 5 MG PO TABS: 5.0000 mg | ORAL_TABLET | Freq: Every evening | ORAL | 1 refills | Status: AC | PRN

## 2024-03-29 ENCOUNTER — Encounter: Payer: Self-pay | Admitting: Nurse Practitioner

## 2024-04-15 ENCOUNTER — Other Ambulatory Visit: Payer: Self-pay | Admitting: Family Medicine

## 2024-04-15 DIAGNOSIS — E079 Disorder of thyroid, unspecified: Secondary | ICD-10-CM

## 2024-04-16 NOTE — Telephone Encounter (Signed)
 Requested Prescriptions  Pending Prescriptions Disp Refills   levothyroxine  (SYNTHROID ) 75 MCG tablet [Pharmacy Med Name: LEVOTHYROXINE  0.075MG  ( ) TABS] 90 tablet 1    Sig: TAKE 1 TABLET(75 MCG) BY MOUTH DAILY BEFORE BREAKFAST     Endocrinology:  Hypothyroid Agents Passed - 04/16/2024  4:15 PM      Passed - TSH in normal range and within 360 days    TSH  Date Value Ref Range Status  08/31/2023 1.69 0.40 - 4.50 mIU/L Final         Passed - Valid encounter within last 12 months    Recent Outpatient Visits           7 months ago Polyarthralgia   [facility] Duanne Butler DASEN, MD   1 year ago Bronchiectasis without complication [facility])   [facility] Duanne Butler DASEN, MD   1 year ago Bronchitis   [facility] Duanne Butler DASEN, MD   1 year ago Polyarthralgia   [facility] Duanne Butler DASEN, MD   2 years ago Elevated blood sugar   [facility] [place], Butler DASEN, MD

## 2024-05-05 ENCOUNTER — Encounter: Payer: Self-pay | Admitting: Family Medicine

## 2024-05-06 ENCOUNTER — Ambulatory Visit: Payer: Self-pay

## 2024-05-06 NOTE — Telephone Encounter (Signed)
 FYI Only or Action Required?: FYI only for provider: appointment scheduled on Tomorrow morning.  Patient was last seen in primary care on 08/31/2023 by Duanne Butler DASEN, MD.  Called Nurse Triage reporting Neck Pain.  Symptoms began several months ago.  Interventions attempted: Ice/heat application.  Symptoms are: gradually worsening.  Triage Disposition: See PCP When Office is Open (Within 3 Days)  Patient/caregiver understands and will follow disposition?: Yes                          Copied from CRM #[id number]. Topic: Clinical - Red Word Triage >> May 06, 2024  8:41 AM Delon HERO wrote: Red Word that prompted transfer to Nurse Triage: Patient is calling to report neck pain for 2 months that goes from being a constant severe neck pain to a dull pain. Please advise Reason for Disposition  [1] MODERATE neck pain (e.g., interferes with normal activities) AND [2] present > 3 days  Answer Assessment - Initial Assessment Questions 1. ONSET: When did the pain begin?      2 months ago 2. LOCATION: Where does it hurt?      Left side of neck 3. PATTERN Does the pain come and go, or has it been constant since it started?      Constant - but gets worse at times 4. SEVERITY: How bad is the pain?  (Scale 0-10; or none or slight stiffness, mild, moderate, severe)     Mild moderate constantly with intense pain when she moves her head a certain way 5. RADIATION: Does the pain go anywhere else, shoot into your arms?     Down back 6. CORD SYMPTOMS: Any weakness or numbness of the arms or legs?     Mild neuropathy 7. CAUSE: What do you think is causing the neck pain?     Might have been form dog pulling on leash  -also fell d/t dog  8. NECK OVERUSE: Any recent activities that involved turning or twisting the neck?     no 9. OTHER SYMPTOMS: Do you have any other symptoms? (e.g., headache, fever, chest pain, difficulty breathing, neck swelling)      no  Protocols used: Neck Pain or Stiffness-A-AH

## 2024-05-07 ENCOUNTER — Ambulatory Visit: Admitting: Family Medicine

## 2024-05-07 ENCOUNTER — Ambulatory Visit
Admission: RE | Admit: 2024-05-07 | Discharge: 2024-05-07 | Disposition: A | Source: Ambulatory Visit | Attending: Family Medicine | Admitting: Family Medicine

## 2024-05-07 ENCOUNTER — Encounter: Payer: Self-pay | Admitting: Family Medicine

## 2024-05-07 VITALS — BP 112/62 | HR 63 | Temp 98.3°F | Ht 62.5 in | Wt 133.0 lb

## 2024-05-07 DIAGNOSIS — M5489 Other dorsalgia: Secondary | ICD-10-CM

## 2024-05-07 DIAGNOSIS — M546 Pain in thoracic spine: Secondary | ICD-10-CM | POA: Diagnosis not present

## 2024-05-07 DIAGNOSIS — G8929 Other chronic pain: Secondary | ICD-10-CM | POA: Diagnosis not present

## 2024-05-07 DIAGNOSIS — M47812 Spondylosis without myelopathy or radiculopathy, cervical region: Secondary | ICD-10-CM | POA: Diagnosis not present

## 2024-05-07 DIAGNOSIS — M1991 Primary osteoarthritis, unspecified site: Secondary | ICD-10-CM | POA: Insufficient documentation

## 2024-05-07 NOTE — Progress Notes (Signed)
 Subjective:    Patient ID: Annette Barker, female    DOB: May 07, 1946, 78 y.o.   MRN: [id number]  Patient reports a 1 month history of neck and back pain.  The pain began slightly to the left of the cervical spine roughly at the level of C6.  Patient denies any radiation of the pain down her left arm.  She denies any weakness or numbness in her left arm.  She denies any loss of grip strength.  She now complains of stiffness and pain radiating down her back from the lower cervical spine to the bottom of her thoracic spine.  Is more of a stiffness.  She denies any fevers or chills.  She denies any night sweats.  She does have a history of polymyalgia rheumatica. Past Medical History:  Diagnosis Date   Anxiety    Arthritis    in her hands    Elevated cholesterol    Hair loss    Hemorrhoids    Hyperlipidemia    Hypothyroidism    on synthroid    IBS (irritable bowel syndrome)    Lyme disease    Osteoporosis 03/2017   T score -3.2   Thyroid  disease    hypothyroid   Past Surgical History:  Procedure Laterality Date   LAPAROSCOPIC SALPINGO OOPHERECTOMY Bilateral 10/29/2014   Procedure: LAPAROSCOPIC SALPINGO OOPHORECTOMY;  Surgeon: Evalene SHAUNNA Organ, MD;  Location: [facility];  Service: Gynecology;  Laterality: Bilateral;  1:00pm OR time requested  1 1/2 hours OR time requested.   SHOULDER SURGERY     TUBAL LIGATION     Current Outpatient Medications on File Prior to Visit  Medication Sig Dispense Refill   B Complex Vitamins (VITAMIN B COMPLEX PO) Take by mouth.     diazepam  (VALIUM ) 5 MG tablet Take 1 tablet (5 mg total) by mouth at bedtime as needed for anxiety. 30 tablet 1   diphenhydrAMINE HCl (BENADRYL PO) Take 1 tablet by mouth daily as needed.     Estradiol  0.25 MG/0.25GM GEL Place topically on the lower abdomen daily 90 each 3   levothyroxine  (SYNTHROID ) 75 MCG tablet TAKE 1 TABLET(75 MCG) BY MOUTH DAILY BEFORE BREAKFAST 90 tablet 1   medroxyPROGESTERone  (PROVERA ) 2.5 MG tablet Take  1 tablet (2.5 mg total) by mouth daily. 90 tablet 3   Methylcellulose, Laxative, (CITRUCEL PO) Take by mouth daily in the afternoon.     MILK THISTLE PO Take by mouth.     Multiple Minerals-Vitamins (CALCIUM  & VIT D3 BONE HEALTH PO) Take by mouth.     Multiple Vitamins-Minerals (PRESERVISION AREDS PO) Take by mouth.     triamcinolone  cream (KENALOG ) 0.1 % Apply 1 application topically 2 (two) times daily. 30 g 0   No current facility-administered medications on file prior to visit.   Allergies  Allergen Reactions   Hydromet [Hydrocodone  Bit-Homatrop Mbr] Itching and Swelling   Codeine Swelling   Ibuprofen     Other reaction(s): reflux   Statins Other (See Comments)    Body cramps all over body   Social History   Socioeconomic History   Marital status: Widowed    Spouse name: Not on file   Number of children: Not on file   Years of education: Not on file   Highest education level: Not on file  Occupational History   Not on file  Tobacco Use   Smoking status: Former    Current packs/day: 0.00    Types: Cigarettes    Quit date: 05/27/1991  Years since quitting: 32.9   Smokeless tobacco: Never  Vaping Use   Vaping status: Never Used  Substance and Sexual Activity   Alcohol use: Yes    Alcohol/week: 5.0 standard drinks of alcohol    Types: 5 Standard drinks or equivalent per week    Comment: occ   Drug use: No   Sexual activity: Yes    Birth control/protection: Post-menopausal    Comment: 1st intercourse 78 yo--Fewer than 5 partners, Less than 5, after 16, no std, no abnormal pap, no hx cancer, no DES  Other Topics Concern   Not on file  Social History Narrative   Not on file   Social Drivers of Health   Tobacco Use: Medium Risk (05/07/2024)   Patient History    Smoking Tobacco Use: Former    Smokeless Tobacco Use: Never    Passive Exposure: Not on file  Financial Resource Strain: Low Risk (10/04/2023)   Overall Financial Resource Strain (CARDIA)    Difficulty of  Paying Living Expenses: Not hard at all  Food Insecurity: No Food Insecurity (10/04/2023)   Hunger Vital Sign    Worried About Running Out of Food in the Last Year: Never true    Ran Out of Food in the Last Year: Never true  Transportation Needs: No Transportation Needs (10/04/2023)   PRAPARE - Administrator, Civil Service (Medical): No    Lack of Transportation (Non-Medical): No  Physical Activity: Sufficiently Active (10/04/2023)   Exercise Vital Sign    Days of Exercise per Week: 5 days    Minutes of Exercise per Session: 30 min  Stress: No Stress Concern Present (10/04/2023)   [employer] of Occupational Health - Occupational Stress Questionnaire    Feeling of Stress : Not at all  Social Connections: Socially Integrated (10/04/2023)   Social Connection and Isolation Panel    Frequency of Communication with Friends and Family: More than three times a week    Frequency of Social Gatherings with Friends and Family: More than three times a week    Attends Religious Services: More than 4 times per year    Active Member of [employer] or Organizations: Yes    Attends Banker Meetings: 1 to 4 times per year    Marital Status: Married  Catering Manager Violence: Not At Risk (10/04/2023)   Humiliation, Afraid, Rape, and Kick questionnaire    Fear of Current or Ex-Partner: No    Emotionally Abused: No    Physically Abused: No    Sexually Abused: No  Depression (PHQ2-9): Low Risk (10/04/2023)   Depression (PHQ2-9)    PHQ-2 Score: 0  Alcohol Screen: Low Risk (10/04/2023)   Alcohol Screen    Last Alcohol Screening Score (AUDIT): 0  Housing: Unknown (10/04/2023)   Housing Stability Vital Sign    Unable to Pay for Housing in the Last Year: No    Number of Times Moved in the Last Year: Not on file    Homeless in the Last Year: No  Utilities: Not At Risk (10/04/2023)   [facility] Utilities    Threatened with loss of utilities: No  Health Literacy: Adequate Health Literacy  (10/04/2023)   B1300 Health Literacy    Frequency of need for help with medical instructions: Never      Review of Systems  All other systems reviewed and are negative.      Objective:   Physical Exam Vitals reviewed.  Constitutional:      General: She is  not in acute distress.    Appearance: Normal appearance. She is not ill-appearing, toxic-appearing or diaphoretic.  HENT:     Head: Normocephalic and atraumatic.  Cardiovascular:     Rate and Rhythm: Normal rate and regular rhythm.     Pulses: Normal pulses.     Heart sounds: Normal heart sounds. No murmur heard.    No friction rub. No gallop.  Pulmonary:     Effort: Pulmonary effort is normal. No respiratory distress.     Breath sounds: No stridor. No wheezing, rhonchi or rales.  Chest:     Chest wall: No tenderness.  Musculoskeletal:     Cervical back: No erythema, spasms, tenderness, bony tenderness or crepitus. No pain with movement. Normal range of motion.     Thoracic back: No swelling, deformity, spasms, tenderness or bony tenderness. Normal range of motion.       Back:     Right lower leg: No edema.     Left lower leg: No edema.     Right foot: Normal range of motion. No deformity, bunion, Charcot foot or prominent metatarsal heads.     Left foot: Normal range of motion. No deformity, bunion, Charcot foot or prominent metatarsal heads.  Feet:     Right foot:     Skin integrity: Skin integrity normal.     Left foot:     Skin integrity: Skin integrity normal.  Neurological:     Mental Status: She is alert.     Assessment & Plan:  Other chronic back pain - Plan: DG Cervical Spine Complete, DG Thoracic Spine W/Swimmers, CBC with Differential/Platelet, Sedimentation rate  I believe the majority the patient's pain is related to muscle stiffness.  I am concerned about possible polymyalgia rheumatica given the patient's past medical history.  I will start by checking a sedimentation rate.  If significantly elevated  we may need to uptitrate her prednisone  to address polymyalgia rheumatica.  I will also check a CBC to monitor her white blood cell count.  If significantly elevated, this could be an indication of a bone marrow issue causing bone pain.  Obtain x-rays of the cervical and thoracic spine.  If x-rays and lab work are unremarkable I would recommend physical therapy and/or NSAIDs and muscle relaxers.

## 2024-05-08 LAB — CBC WITH DIFFERENTIAL/PLATELET
Absolute Lymphocytes: 2024 {cells}/uL (ref 850–3900)
Absolute Monocytes: 568 {cells}/uL (ref 200–950)
Basophils Absolute: 64 {cells}/uL (ref 0–200)
Basophils Relative: 0.8 %
Eosinophils Absolute: 160 {cells}/uL (ref 15–500)
Eosinophils Relative: 2 %
HCT: 37.4 % (ref 35.9–46.0)
Hemoglobin: 12.1 g/dL (ref 11.7–15.5)
MCH: 32.5 pg (ref 27.0–33.0)
MCHC: 32.4 g/dL (ref 31.6–35.4)
MCV: 100.5 fL (ref 81.4–101.7)
MPV: 9.8 fL (ref 7.5–12.5)
Monocytes Relative: 7.1 %
Neutro Abs: 5184 {cells}/uL (ref 1500–7800)
Neutrophils Relative %: 64.8 %
Platelets: 298 Thousand/uL (ref 140–400)
RBC: 3.72 Million/uL — ABNORMAL LOW (ref 3.80–5.10)
RDW: 12.9 % (ref 11.0–15.0)
Total Lymphocyte: 25.3 %
WBC: 8 Thousand/uL (ref 3.8–10.8)

## 2024-05-08 LAB — SEDIMENTATION RATE: Sed Rate: 19 mm/h (ref 0–30)

## 2024-05-09 ENCOUNTER — Ambulatory Visit: Payer: Self-pay | Admitting: Family Medicine

## 2024-05-09 NOTE — Progress Notes (Signed)
 See other encounter. Results delivered via MyChart.

## 2024-10-09 ENCOUNTER — Ambulatory Visit
